# Patient Record
Sex: Male | Born: 1935 | Race: White | Hispanic: No | Marital: Married | Smoking: Former smoker
Health system: Southern US, Community
[De-identification: ages and names within clinical notes are randomized; demographics above are authoritative.]

## PROBLEM LIST (undated history)

## (undated) DIAGNOSIS — K449 Diaphragmatic hernia without obstruction or gangrene: Secondary | ICD-10-CM

## (undated) DIAGNOSIS — I2699 Other pulmonary embolism without acute cor pulmonale: Secondary | ICD-10-CM

## (undated) DIAGNOSIS — R053 Chronic cough: Secondary | ICD-10-CM

## (undated) DIAGNOSIS — K922 Gastrointestinal hemorrhage, unspecified: Secondary | ICD-10-CM

## (undated) DIAGNOSIS — E119 Type 2 diabetes mellitus without complications: Secondary | ICD-10-CM

## (undated) DIAGNOSIS — C61 Malignant neoplasm of prostate: Secondary | ICD-10-CM

## (undated) DIAGNOSIS — I82409 Acute embolism and thrombosis of unspecified deep veins of unspecified lower extremity: Secondary | ICD-10-CM

## (undated) DIAGNOSIS — I10 Essential (primary) hypertension: Secondary | ICD-10-CM

## (undated) DIAGNOSIS — Z9289 Personal history of other medical treatment: Secondary | ICD-10-CM

## (undated) DIAGNOSIS — R05 Cough: Secondary | ICD-10-CM

## (undated) HISTORY — DX: Other pulmonary embolism without acute cor pulmonale: I26.99

## (undated) HISTORY — PX: IVC FILTER INSERTION: CATH118245

## (undated) HISTORY — DX: Essential (primary) hypertension: I10

## (undated) HISTORY — DX: Chronic cough: R05.3

## (undated) HISTORY — DX: Diaphragmatic hernia without obstruction or gangrene: K44.9

## (undated) HISTORY — DX: Type 2 diabetes mellitus without complications: E11.9

## (undated) HISTORY — PX: OTHER SURGICAL HISTORY: SHX169

## (undated) HISTORY — DX: Cough: R05

## (undated) HISTORY — DX: Acute embolism and thrombosis of unspecified deep veins of unspecified lower extremity: I82.409

## (undated) HISTORY — DX: Personal history of other medical treatment: Z92.89

## (undated) HISTORY — PX: HERNIA REPAIR: SHX51

## (undated) HISTORY — PX: HIATAL HERNIA REPAIR: SHX195

## (undated) HISTORY — DX: Gastrointestinal hemorrhage, unspecified: K92.2

## (undated) HISTORY — DX: Malignant neoplasm of prostate: C61

## (undated) HISTORY — PX: PROSTATE SURGERY: SHX751

---

## 1998-03-09 ENCOUNTER — Other Ambulatory Visit: Admission: RE | Admit: 1998-03-09 | Discharge: 1998-03-09 | Payer: Self-pay | Admitting: Urology

## 1998-08-01 ENCOUNTER — Other Ambulatory Visit: Admission: RE | Admit: 1998-08-01 | Discharge: 1998-08-01 | Payer: Self-pay | Admitting: Urology

## 2004-10-07 DIAGNOSIS — Z9289 Personal history of other medical treatment: Secondary | ICD-10-CM

## 2004-10-07 HISTORY — DX: Personal history of other medical treatment: Z92.89

## 2007-10-30 ENCOUNTER — Encounter (HOSPITAL_COMMUNITY): Admission: RE | Admit: 2007-10-30 | Discharge: 2008-01-28 | Payer: Self-pay | Admitting: Urology

## 2008-09-13 ENCOUNTER — Encounter: Admission: RE | Admit: 2008-09-13 | Discharge: 2008-09-13 | Payer: Self-pay | Admitting: Internal Medicine

## 2010-10-03 ENCOUNTER — Ambulatory Visit (HOSPITAL_COMMUNITY)
Admission: RE | Admit: 2010-10-03 | Discharge: 2010-10-03 | Payer: Self-pay | Source: Home / Self Care | Attending: Orthopedic Surgery | Admitting: Orthopedic Surgery

## 2010-10-07 DIAGNOSIS — Z9289 Personal history of other medical treatment: Secondary | ICD-10-CM

## 2010-10-07 HISTORY — DX: Personal history of other medical treatment: Z92.89

## 2011-03-13 ENCOUNTER — Inpatient Hospital Stay (HOSPITAL_COMMUNITY)
Admission: AD | Admit: 2011-03-13 | Discharge: 2011-03-17 | DRG: 176 | Disposition: A | Discharge status: Home / Self Care | Payer: Medicare Other | Source: Other Acute Inpatient Hospital | Attending: Internal Medicine | Admitting: Internal Medicine

## 2011-03-13 DIAGNOSIS — Z8546 Personal history of malignant neoplasm of prostate: Secondary | ICD-10-CM

## 2011-03-13 DIAGNOSIS — Z79899 Other long term (current) drug therapy: Secondary | ICD-10-CM

## 2011-03-13 DIAGNOSIS — I10 Essential (primary) hypertension: Secondary | ICD-10-CM | POA: Diagnosis present

## 2011-03-13 DIAGNOSIS — D649 Anemia, unspecified: Secondary | ICD-10-CM | POA: Diagnosis not present

## 2011-03-13 DIAGNOSIS — I824Y9 Acute embolism and thrombosis of unspecified deep veins of unspecified proximal lower extremity: Secondary | ICD-10-CM | POA: Diagnosis present

## 2011-03-13 DIAGNOSIS — I2692 Saddle embolus of pulmonary artery without acute cor pulmonale: Principal | ICD-10-CM | POA: Diagnosis present

## 2011-03-13 DIAGNOSIS — Z87891 Personal history of nicotine dependence: Secondary | ICD-10-CM

## 2011-03-14 DIAGNOSIS — I2699 Other pulmonary embolism without acute cor pulmonale: Secondary | ICD-10-CM

## 2011-03-14 DIAGNOSIS — R222 Localized swelling, mass and lump, trunk: Secondary | ICD-10-CM

## 2011-03-14 DIAGNOSIS — Z9289 Personal history of other medical treatment: Secondary | ICD-10-CM

## 2011-03-14 DIAGNOSIS — I517 Cardiomegaly: Secondary | ICD-10-CM

## 2011-03-14 HISTORY — DX: Personal history of other medical treatment: Z92.89

## 2011-03-14 LAB — CBC
HCT: 38.2 % — ABNORMAL LOW (ref 39.0–52.0)
HCT: 39.2 % (ref 39.0–52.0)
Hemoglobin: 13.4 g/dL (ref 13.0–17.0)
Hemoglobin: 13.4 g/dL (ref 13.0–17.0)
MCH: 30.8 pg (ref 26.0–34.0)
MCH: 30.3 pg (ref 26.0–34.0)
MCHC: 35.1 g/dL (ref 30.0–36.0)
MCHC: 34.2 g/dL (ref 30.0–36.0)
MCV: 87.8 fL (ref 78.0–100.0)
MCV: 88.7 fL (ref 78.0–100.0)
Platelets: 126 10*3/uL — ABNORMAL LOW (ref 150–400)
Platelets: 140 10*3/uL — ABNORMAL LOW (ref 150–400)
RBC: 4.35 MIL/uL (ref 4.22–5.81)
RBC: 4.42 MIL/uL (ref 4.22–5.81)
RDW: 12.7 % (ref 11.5–15.5)
RDW: 12.9 % (ref 11.5–15.5)
WBC: 7.2 10*3/uL (ref 4.0–10.5)
WBC: 7.7 10*3/uL (ref 4.0–10.5)

## 2011-03-14 LAB — DIFFERENTIAL
Basophils Absolute: 0 10*3/uL (ref 0.0–0.1)
Basophils Absolute: 0 10*3/uL (ref 0.0–0.1)
Basophils Relative: 0 % (ref 0–1)
Basophils Relative: 1 % (ref 0–1)
Eosinophils Absolute: 0.2 10*3/uL (ref 0.0–0.7)
Eosinophils Absolute: 0.2 10*3/uL (ref 0.0–0.7)
Eosinophils Relative: 3 % (ref 0–5)
Eosinophils Relative: 3 % (ref 0–5)
Lymphocytes Relative: 24 % (ref 12–46)
Lymphocytes Relative: 30 % (ref 12–46)
Lymphs Abs: 1.7 10*3/uL (ref 0.7–4.0)
Lymphs Abs: 2.3 10*3/uL (ref 0.7–4.0)
Monocytes Absolute: 0.7 10*3/uL (ref 0.1–1.0)
Monocytes Absolute: 0.8 10*3/uL (ref 0.1–1.0)
Monocytes Relative: 9 % (ref 3–12)
Monocytes Relative: 10 % (ref 3–12)
Neutro Abs: 4.5 10*3/uL (ref 1.7–7.7)
Neutro Abs: 4.3 10*3/uL (ref 1.7–7.7)
Neutrophils Relative %: 63 % (ref 43–77)
Neutrophils Relative %: 57 % (ref 43–77)

## 2011-03-14 LAB — COMPREHENSIVE METABOLIC PANEL
ALT: 36 U/L (ref 0–53)
ALT: 33 U/L (ref 0–53)
AST: 27 U/L (ref 0–37)
AST: 24 U/L (ref 0–37)
Albumin: 3.4 g/dL — ABNORMAL LOW (ref 3.5–5.2)
Albumin: 3.4 g/dL — ABNORMAL LOW (ref 3.5–5.2)
Alkaline Phosphatase: 72 U/L (ref 39–117)
Alkaline Phosphatase: 70 U/L (ref 39–117)
BUN: 26 mg/dL — ABNORMAL HIGH (ref 6–23)
BUN: 27 mg/dL — ABNORMAL HIGH (ref 6–23)
CO2: 28 mEq/L (ref 19–32)
CO2: 29 mEq/L (ref 19–32)
Calcium: 9.4 mg/dL (ref 8.4–10.5)
Calcium: 9.4 mg/dL (ref 8.4–10.5)
Chloride: 98 mEq/L (ref 96–112)
Chloride: 100 mEq/L (ref 96–112)
Creatinine, Ser: 1.08 mg/dL (ref 0.4–1.5)
Creatinine, Ser: 1.11 mg/dL (ref 0.4–1.5)
GFR calc Af Amer: >60 mL/min (ref >60)
GFR calc Af Amer: >60 mL/min (ref >60)
GFR calc non Af Amer: >60 mL/min (ref >60)
GFR calc non Af Amer: >60 mL/min (ref >60)
Glucose, Bld: 172 mg/dL — ABNORMAL HIGH (ref 70–99)
Glucose, Bld: 96 mg/dL (ref 70–99)
Potassium: 3.6 mEq/L (ref 3.5–5.1)
Potassium: 4.2 mEq/L (ref 3.5–5.1)
Sodium: 137 mEq/L (ref 135–145)
Sodium: 138 mEq/L (ref 135–145)
Total Bilirubin: 0.4 mg/dL (ref 0.3–1.2)
Total Bilirubin: 0.5 mg/dL (ref 0.3–1.2)
Total Protein: 6.8 g/dL (ref 6.0–8.3)
Total Protein: 6.8 g/dL (ref 6.0–8.3)

## 2011-03-14 LAB — HEPARIN LEVEL (UNFRACTIONATED)
Heparin Unfractionated: 0.45 IU/mL (ref 0.30–0.70)
Heparin Unfractionated: 0.68 IU/mL (ref 0.30–0.70)

## 2011-03-14 LAB — CARDIAC PANEL(CRET KIN+CKTOT+MB+TROPI)
CK, MB: 4.8 ng/mL — ABNORMAL HIGH (ref 0.3–4.0)
CK, MB: 4.2 ng/mL — ABNORMAL HIGH (ref 0.3–4.0)
CK, MB: 3.4 ng/mL (ref 0.3–4.0)
Relative Index: INVALID (ref 0.0–2.5)
Relative Index: INVALID (ref 0.0–2.5)
Relative Index: INVALID (ref 0.0–2.5)
Total CK: 76 U/L (ref 7–232)
Total CK: 61 U/L (ref 7–232)
Total CK: 47 U/L (ref 7–232)
Troponin I: <0.3 ng/mL (ref <0.30)
Troponin I: <0.3 ng/mL (ref <0.30)
Troponin I: <0.3 ng/mL (ref <0.30)

## 2011-03-14 LAB — HEMOGLOBIN AND HEMATOCRIT, BLOOD
HCT: 38.7 % — ABNORMAL LOW (ref 39.0–52.0)
HCT: 39 % (ref 39.0–52.0)
Hemoglobin: 13.1 g/dL (ref 13.0–17.0)
Hemoglobin: 13.3 g/dL (ref 13.0–17.0)

## 2011-03-14 LAB — PHOSPHORUS
Phosphorus: 2.6 mg/dL (ref 2.3–4.6)
Phosphorus: 3.9 mg/dL (ref 2.3–4.6)

## 2011-03-14 LAB — HEMOGLOBIN A1C
Hgb A1c MFr Bld: 6 % — ABNORMAL HIGH (ref <5.7)
Mean Plasma Glucose: 126 mg/dL — ABNORMAL HIGH (ref <117)

## 2011-03-14 LAB — MAGNESIUM
Magnesium: 1.9 mg/dL (ref 1.5–2.5)
Magnesium: 2 mg/dL (ref 1.5–2.5)

## 2011-03-14 LAB — PROTIME-INR
INR: 1.05 (ref 0.00–1.49)
Prothrombin Time: 13.9 seconds (ref 11.6–15.2)

## 2011-03-14 LAB — APTT: aPTT: 77 seconds — ABNORMAL HIGH (ref 24–37)

## 2011-03-15 LAB — BASIC METABOLIC PANEL
BUN: 23 mg/dL (ref 6–23)
CO2: 30 mEq/L (ref 19–32)
Calcium: 9.7 mg/dL (ref 8.4–10.5)
Chloride: 100 mEq/L (ref 96–112)
Creatinine, Ser: 1 mg/dL (ref 0.4–1.5)
GFR calc Af Amer: >60 mL/min (ref >60)
GFR calc non Af Amer: >60 mL/min (ref >60)
Glucose, Bld: 101 mg/dL — ABNORMAL HIGH (ref 70–99)
Potassium: 4.8 mEq/L (ref 3.5–5.1)
Sodium: 137 mEq/L (ref 135–145)

## 2011-03-15 LAB — PROTIME-INR
INR: 1.15 (ref 0.00–1.49)
Prothrombin Time: 14.9 seconds (ref 11.6–15.2)

## 2011-03-15 LAB — CBC
HCT: 40.4 % (ref 39.0–52.0)
Hemoglobin: 13.6 g/dL (ref 13.0–17.0)
MCH: 30.5 pg (ref 26.0–34.0)
MCHC: 33.7 g/dL (ref 30.0–36.0)
MCV: 90.6 fL (ref 78.0–100.0)
Platelets: 153 10*3/uL (ref 150–400)
RBC: 4.46 MIL/uL (ref 4.22–5.81)
RDW: 12.8 % (ref 11.5–15.5)
WBC: 7.6 10*3/uL (ref 4.0–10.5)

## 2011-03-15 LAB — HEPARIN LEVEL (UNFRACTIONATED): Heparin Unfractionated: 0.69 IU/mL (ref 0.30–0.70)

## 2011-03-15 LAB — HEMOGLOBIN AND HEMATOCRIT, BLOOD
HCT: 38 % — ABNORMAL LOW (ref 39.0–52.0)
Hemoglobin: 12.9 g/dL — ABNORMAL LOW (ref 13.0–17.0)

## 2011-03-16 LAB — CBC
HCT: 37.4 % — ABNORMAL LOW (ref 39.0–52.0)
Hemoglobin: 12.9 g/dL — ABNORMAL LOW (ref 13.0–17.0)
MCH: 30.6 pg (ref 26.0–34.0)
MCHC: 34.5 g/dL (ref 30.0–36.0)
MCV: 88.8 fL (ref 78.0–100.0)
Platelets: 143 10*3/uL — ABNORMAL LOW (ref 150–400)
RBC: 4.21 MIL/uL — ABNORMAL LOW (ref 4.22–5.81)
RDW: 12.9 % (ref 11.5–15.5)
WBC: 8.1 10*3/uL (ref 4.0–10.5)

## 2011-03-16 LAB — PROTIME-INR
INR: 1.41 (ref 0.00–1.49)
Prothrombin Time: 17.5 seconds — ABNORMAL HIGH (ref 11.6–15.2)

## 2011-03-16 LAB — HEPARIN LEVEL (UNFRACTIONATED): Heparin Unfractionated: 0.54 IU/mL (ref 0.30–0.70)

## 2011-03-17 LAB — PROTIME-INR
INR: 2.04 — ABNORMAL HIGH (ref 0.00–1.49)
Prothrombin Time: 23.2 seconds — ABNORMAL HIGH (ref 11.6–15.2)

## 2011-03-21 NOTE — Discharge Summary (Signed)
Dakota Lewis, Dakota Lewis                 ACCOUNT NO.:  [id number]  MEDICAL RECORD NO.:  [id number]  LOCATION:  2022                         FACILITY:  [place]  PHYSICIAN:  Oren Binet, MD    DATE OF BIRTH:  10-15-1932  DATE OF ADMISSION:  03/13/2011 DATE OF DISCHARGE:  03/17/2011                        DISCHARGE SUMMARY - REFERRING   PRIMARY CARE PRACTITIONER:  Nelda Bucks, MD  PRIMARY DISCHARGE DIAGNOSES: 1. Significant bilateral pulmonary embolism to main pulmonary arteries     with a strand of clot connecting it. 2. Deep vein thrombosis of the left lower extremity.  SECONDARY DISCHARGE DIAGNOSES: 1. History of hypertension. 2. Prior history of prior gastrointestinal bleed. 3. History of prostate cancer, status post radiation. 4. History of chronic cough.  DISCHARGE MEDICATIONS: 1. Coumadin 5 mg 1 tablet daily. 2. Lovenox 120 mg subcutaneously daily. 3. Ferrous sulfate 325 mg 1 tablet p.o. twice daily. 4. Fish oil 1000 mg 2 capsule p.o. twice daily. 5. Hydrochlorothiazide 25 mg 1 tablet every morning. 6. Lisinopril 20 mg 1 tablet twice daily. 7. Omeprazole 20 mg 1 tablet p.o. daily at bedtime. 8. Vitamin D3 1000 units 2 tablets p.o. every morning.  CONSULTATION:  Dr. Lamonte Sakai from a pulmonary critical care.  BRIEF HISTORY OF PRESENT ILLNESS:  The patient is a very pleasant 77- year-old male who apparently has been having on and off issues with cough for the past 6 months and was having some shortness of breath as well.  He went to see his primary care practitioner where a CT angiogram of the chest was done which did demonstrate significant pulmonary embolism with saddle component.  The CTA of the chest was done at [facility].  Because of the significant nature of his pulmonary embolism he was transferred to [facility] for further evaluation and treatment.  For further details please see the history and physical that was dictated by Dr. Benny Lennert on  admission.  PERTINENT RADIOLOGICAL STUDIES:  A CT angiogram of the chest done at [facility] was read as bilateral main pulmonary artery significant thrombus, a linear thrombus is noted connecting thrombi in the right and left main pulmonary artery.  Findings are consistent with significant bilateral pulmonary emboli.  No acute infiltrate or edema. No lung infarction.  PERTINENT LABORATORY DATA:  INR on discharge is 2.04.  A 2-D echocardiogram showed systolic function to be vigorous with an EF around 65%-70%.  The right ventricle was mildly dilated.  There was grade 1 diastolic dysfunction.  BRIEF HOSPITAL COURSE:  1.  Large pulmonary embolism with a small saddle component.  The patient was admitted to [facility] as a transfer from [place] [facility] because of the significant clot burden.  It is unknown what actually provoked this patient's pulmonary embolism and deep vein thrombosis.  The patient does have a history of prostate cancer, but the family and the patient tell me that only a few months ago his PSA was actually within normal limits.  He has no significant history of travel in the recent past.  He claims his other cancer screenings are actually up to date.  In any event, the patient was admitted, placed on heparin and started on  Coumadin.  He did have significant shortness of breath upon just walking even 25 yards. Currently, he is now been transitioned to Lovenox and Coumadin and his INR is therapeutic on the fourth day.  His shortness of breath has almost resolved and he is actually ambulatory in the hallway without any difficulty.  He is also hemodynamically stable with O2 saturations maintaining on room air.  It is our plan at this time to continue his Lovenox until tomorrow to complete a 5-day overlap.  He has been getting 7.5 mg of Coumadin for the past 3 days and his INR is therapeutic, so we will slightly cut down the dose to 5 mg for the next 2 days.   Patient has an appointment with his primary care practitioner scheduled by our care management worker for Tuesday the March 19, 2011 at 1:45 p.m. for an INR check.  The patient will be provided with a prescription for Lovenox for another 2 more days and a week supply of Coumadin.  Lovenox teaching has been completed by the nursing staff and the patient is actually injected Lovenox himself and feels pretty comfortable doing at home.  At this point it is believed that the patient has met maximal benefit from his inpatient stay and is stable to be discharged home to continue anticoagulation as an outpatient.  Please do note that a venous Doppler ultrasound did show a DVT in the left lower extremity as well. 2. Hypertension.  This was stable.  The patient is to continue his     regular medications. 3. The patient has a slight ecchymotic area on his right forearm that     has been stable for now 48 hours.  The patient has been instructed     by me to present to the ED or seek immediate medical attention with     his primary care practitioner if this would enlarge rapidly.  At     this time it is believed that this was probably a small traumatic scratch that resulted in this and it is felt that Coumadin skin     necrosis is very unlikely.  However, the patient has been asked to     keep an eye and this and present to the ED or seek immediate     medical attention if it would enlarge. 4. Prior history of GI bleed.  He is maintained on a proton pump     inhibitor.  The patient has been instructed by me repeatedly to     seek immediate medical attention if there is any evidence of     hematemesis or blood in his stools.  The patient and the family     claim understanding. 5. History of anemia.  The patient is maintained on iron     supplementation.  He is to continue this. 6. History of prostate cancer.  The patient is to follow up with     [facility] in the next few weeks as  well.  DISPOSITION:  It is felt that this patient has met maximal benefit from his inpatient hospital stay and is stable to be discharged home.  FOLLOWUP INSTRUCTIONS: 1. The patient will follow up with his primary care practitioner and     primary urologist for further general cancer screening and to     determine what actually provoked his large clot burden. 2. The patient is to follow up with his primary care practitioner on     March 19, 2011 at  1:45 p.m. for an INR check. 3. The patient is to follow up with [facility] in the next few     weeks as well.  He is to call and make an appointment.  Total time spent coordinating discharge 55 minutes.     Oren Binet, MD     SG/MEDQ  D:  03/17/2011  T:  03/17/2011  Job:  [id number]  cc:   Nelda Bucks, MD [facility]  Electronically Signed by Oren Binet  on 03/21/2011 07:35:29 PM

## 2011-03-29 ENCOUNTER — Institutional Professional Consult (permissible substitution): Payer: Self-pay | Admitting: Emergency Medicine

## 2011-04-22 NOTE — H&P (Signed)
Dakota Lewis, Dakota Lewis                 ACCOUNT NO.:  [id number]  MEDICAL RECORD NO.:  [id number]  LOCATION:  2022                         FACILITY:  [facility]  PHYSICIAN:  Draven Laine, DO         DATE OF BIRTH:  May 20, 1933  DATE OF ADMISSION:  03/13/2011 DATE OF DISCHARGE:                             HISTORY & PHYSICAL   CHIEF COMPLAINT:  Shortness of breath, chest pain.  HISTORY OF PRESENT ILLNESS:  The patient is a 77 year old male who has been having a bad cough for about 6 months.  In the last 2 weeks, he has become particularly short of breath, at times having chest pain with his shortness of breath.  His wife states that about 10 days ago she noticed a swelling over the right side of his chest.  They went to see Dr. Delena Bali today and he sent them directly over to [facility] to have a CTA of the chest.  This demonstrated a significant bilateral pulmonary embolus with a strand of clot connecting the emboli in the main pulmonary arteries.  The patient was transferred to [facility] as per family request.  The patient has a previous appointment with Dr. Lamonte Sakai to evaluate this subjective swelling of the right side of the patient's chest.  Curiously, there is no mention of any soft tissue or pulmonary masses or adenopathy on the CT report.  PAST MEDICAL HISTORY: 1. Hypertension. 2. GI bleed. 3. Prostate cancer status post radiation.  PAST SURGICAL HISTORY:  None.  MEDICATIONS AT HOME: 1. Lisinopril 20 mg 1 p.o. b.i.d. 2. Hydrochlorothiazide 25 mg 1 p.o. q.a.m. 3. Omeprazole 20 mg 1 p.o. each night. 4. Vitamin D3 2000 International Units each morning. 5. Fish oil 1000 mg 2 tablets q.a.m. and 2 q.p.m. 6. Ferrous sulfate 325 mg 1 p.o. every morning.  ALLERGIES:  NKDA.  SOCIAL HISTORY:  The patient quit smoking in 1969.  He quit drinking in 1969 and he has no recreational drug use.  FAMILY HISTORY:  Positive for coronary artery disease in his brothers and cancer in  his brothers.  The patient sees a cardiologist at [facility] and he had an appointment with Dr. Lamonte Sakai with [facility]ology.  REVIEW OF SYSTEMS:  CONSTITUTIONAL:  Negative for fever, negative for chills, positive for weakness, and positive for fatigue.  CNS:  Positive for headache, negative for seizure, negative for specific limb weakness. ENT:  No nasal congestion, throat pain, or coryza.  CARDIOVASCULAR: Positive for chest pain with shortness of breath.  Negative for palpitations and negative for orthopnea.  RESPIRATORY:  Positive for shortness breath, positive for cough, positive for sputum production which the patient states was yellow last week but is white frothy this week.  GASTROINTESTINAL:  He has had some vomiting in the last week.  He states that a week or so ago he had some diarrhea.  He occasionally does see fresh blood in his stool but not recently and no hematochezia and no coffee-ground emesis.  GENITOURINARY:  No dysuria, no hematuria, and no urinary frequency.  RENAL:  No flank pain, no swelling, and no pruritus. SKIN:  No rashes, sores, or lesions.  HEMATOLOGIC:  Negative bruising, no purpura, and no clots.  LYMPH:  No lymphadenopathy, no painful nodes, and no specific lymph swelling.  PSYCHIATRIC:  No anxiety, no depression, and no insomnia.  PHYSICAL EXAMINATION:  VITAL SIGNS:  Blood pressure 156/82, pulse 65, respiratory rate 16, temperature 98, and O2 sat 92% on room air. GENERAL:  The patient is an elderly, well-nourished, well-developed male who is awake, alert, and ordered x3. EYES:  Pupils equal, round, and reactive to light and accommodation. External ocular movements are intact.  Sclerae nonicteric, noninjected. MOUTH:  Oral mucosa moist.  No lesions.  No sores.  Pharynx clear.  No erythema and no exudate. NECK:  Negative for JVD, negative for thyromegaly, and negative for lymphadenopathy. HEART:  Regular rate and rhythm at 80 beats per minute without  murmur, ectopy, or gallops.  No lateral PMI.  No thrills. LUNGS:  Clear to auscultation bilaterally without wheezes, rales, or rhonchi.  No increase work of breathing.  No tactile fremitus. ABDOMEN:  Soft, nontender, and nondistended.  Positive bowel sounds.  No hepatosplenomegaly.  No hernias palpated. CARDIOVASCULAR/EXTREMITIES:  Negative for cyanosis, clubbing, or edema. The patient has somewhat diminished dorsalis pedis and popliteal pulses bilaterally.  No carotid bruits bilaterally. NEUROLOGIC:  Cranial nerves II-XII grossly intact.  Motor and sensory intact.  LABORATORY DATA:  All pending with the exception of a CT angiogram report from [facility] that shows significant pulmonary embolus in the bilateral main pulmonary arteries.  No acute infiltrate or edema. No lung infarction.  No adenopathy.  ASSESSMENT: 1. Significant bilateral pulmonary emboli to main pulmonary arteries     with a strand of clots connecting to it. 2. Gastrointestinal bleed.  The patient with occult blood in his stool     as per guaiacs, however, it is notable that the patient takes iron     and this may have skewed the results of this test. 3. Dyspnea secondary to bilateral pulmonary emboli. 4. Hypertension. 5. Headaches. 6. Concern for right chest mass, not apparent or mentioned in CT     report.  PLAN: 1. The patient is admitted to telemetry.  He will receive IV heparin     per protocol.  He received a bolus at [place], so he will not get     that again. 2. Q.6 hours H and H as the patient is on heparin.  If his H and H     does happen to drop, we can certainly stop the heparin.  The     patient will be placed on IV Protonix.  We will transfuse him as     necessary.  We will consult [facility] in the morning.  We     will continue the patient on his home medications.          ______________________________ Karie Kirks, DO     AS/MEDQ  D:  03/13/2011  T:  03/14/2011  Job:   [id number]  cc:   Nelda Bucks, MD Collene Gobble, MD  Electronically Signed by Karie Kirks DO on 04/22/2011 01:37:38 PM

## 2011-05-07 DIAGNOSIS — Z9289 Personal history of other medical treatment: Secondary | ICD-10-CM

## 2011-05-07 HISTORY — DX: Personal history of other medical treatment: Z92.89

## 2011-08-15 ENCOUNTER — Other Ambulatory Visit (HOSPITAL_COMMUNITY): Payer: Self-pay | Admitting: Cardiovascular Disease

## 2011-08-15 DIAGNOSIS — I2699 Other pulmonary embolism without acute cor pulmonale: Secondary | ICD-10-CM

## 2011-09-17 ENCOUNTER — Ambulatory Visit (HOSPITAL_COMMUNITY)
Admission: RE | Admit: 2011-09-17 | Discharge: 2011-09-17 | Disposition: A | Discharge status: Home / Self Care | Payer: Medicare Other | Source: Ambulatory Visit | Attending: Cardiovascular Disease | Admitting: Cardiovascular Disease

## 2011-09-17 DIAGNOSIS — I2699 Other pulmonary embolism without acute cor pulmonale: Secondary | ICD-10-CM

## 2011-09-17 DIAGNOSIS — R079 Chest pain, unspecified: Secondary | ICD-10-CM | POA: Insufficient documentation

## 2011-09-17 DIAGNOSIS — R0602 Shortness of breath: Secondary | ICD-10-CM | POA: Insufficient documentation

## 2011-09-17 MED ORDER — IOHEXOL 300 MG/ML  SOLN
100.0000 mL | Freq: Once | INTRAMUSCULAR | Status: AC | PRN
  Administered 2011-09-17: 100 mL via INTRAVENOUS

## 2011-12-31 LAB — HM COLONOSCOPY

## 2012-05-11 ENCOUNTER — Other Ambulatory Visit (HOSPITAL_COMMUNITY): Payer: Self-pay | Admitting: Cardiovascular Disease

## 2012-05-11 DIAGNOSIS — I2699 Other pulmonary embolism without acute cor pulmonale: Secondary | ICD-10-CM

## 2012-05-12 ENCOUNTER — Ambulatory Visit (HOSPITAL_COMMUNITY)
Admission: RE | Admit: 2012-05-12 | Discharge: 2012-05-12 | Disposition: A | Discharge status: Home / Self Care | Payer: Medicare Other | Source: Ambulatory Visit | Attending: Cardiovascular Disease | Admitting: Cardiovascular Disease

## 2012-05-12 DIAGNOSIS — I2699 Other pulmonary embolism without acute cor pulmonale: Secondary | ICD-10-CM | POA: Insufficient documentation

## 2012-05-12 DIAGNOSIS — R05 Cough: Secondary | ICD-10-CM | POA: Insufficient documentation

## 2012-05-12 DIAGNOSIS — R059 Cough, unspecified: Secondary | ICD-10-CM | POA: Insufficient documentation

## 2012-05-12 DIAGNOSIS — I517 Cardiomegaly: Secondary | ICD-10-CM | POA: Insufficient documentation

## 2012-05-12 DIAGNOSIS — I2510 Atherosclerotic heart disease of native coronary artery without angina pectoris: Secondary | ICD-10-CM | POA: Insufficient documentation

## 2012-05-12 DIAGNOSIS — I2584 Coronary atherosclerosis due to calcified coronary lesion: Secondary | ICD-10-CM | POA: Insufficient documentation

## 2012-05-12 MED ORDER — IOHEXOL 350 MG/ML SOLN
80.0000 mL | Freq: Once | INTRAVENOUS | Status: AC | PRN
  Administered 2012-05-12: 80 mL via INTRAVENOUS

## 2012-07-22 ENCOUNTER — Encounter: Payer: Self-pay | Admitting: Gastroenterology

## 2012-08-04 ENCOUNTER — Ambulatory Visit: Payer: Medicare Other | Admitting: Gastroenterology

## 2015-05-16 ENCOUNTER — Encounter: Payer: Self-pay | Admitting: *Deleted

## 2015-07-05 ENCOUNTER — Encounter: Payer: Self-pay | Admitting: Cardiovascular Disease

## 2015-11-07 DIAGNOSIS — Z7901 Long term (current) use of anticoagulants: Secondary | ICD-10-CM | POA: Diagnosis not present

## 2015-12-06 DIAGNOSIS — R791 Abnormal coagulation profile: Secondary | ICD-10-CM | POA: Diagnosis not present

## 2015-12-11 DIAGNOSIS — I2699 Other pulmonary embolism without acute cor pulmonale: Secondary | ICD-10-CM | POA: Diagnosis not present

## 2015-12-11 DIAGNOSIS — I10 Essential (primary) hypertension: Secondary | ICD-10-CM | POA: Diagnosis not present

## 2015-12-11 DIAGNOSIS — E559 Vitamin D deficiency, unspecified: Secondary | ICD-10-CM | POA: Diagnosis not present

## 2015-12-11 DIAGNOSIS — Z79899 Other long term (current) drug therapy: Secondary | ICD-10-CM | POA: Diagnosis not present

## 2015-12-11 DIAGNOSIS — E119 Type 2 diabetes mellitus without complications: Secondary | ICD-10-CM | POA: Diagnosis not present

## 2015-12-11 DIAGNOSIS — Z6829 Body mass index (BMI) 29.0-29.9, adult: Secondary | ICD-10-CM | POA: Diagnosis not present

## 2015-12-11 DIAGNOSIS — I80202 Phlebitis and thrombophlebitis of unspecified deep vessels of left lower extremity: Secondary | ICD-10-CM | POA: Diagnosis not present

## 2015-12-11 DIAGNOSIS — E663 Overweight: Secondary | ICD-10-CM | POA: Diagnosis not present

## 2015-12-11 DIAGNOSIS — E785 Hyperlipidemia, unspecified: Secondary | ICD-10-CM | POA: Diagnosis not present

## 2016-01-08 DIAGNOSIS — Z7901 Long term (current) use of anticoagulants: Secondary | ICD-10-CM | POA: Diagnosis not present

## 2016-02-07 DIAGNOSIS — Z7901 Long term (current) use of anticoagulants: Secondary | ICD-10-CM | POA: Diagnosis not present

## 2016-02-12 DIAGNOSIS — J208 Acute bronchitis due to other specified organisms: Secondary | ICD-10-CM | POA: Diagnosis not present

## 2016-02-12 DIAGNOSIS — Z6831 Body mass index (BMI) 31.0-31.9, adult: Secondary | ICD-10-CM | POA: Diagnosis not present

## 2016-02-12 DIAGNOSIS — R635 Abnormal weight gain: Secondary | ICD-10-CM | POA: Diagnosis not present

## 2016-02-16 DIAGNOSIS — K7469 Other cirrhosis of liver: Secondary | ICD-10-CM | POA: Diagnosis not present

## 2016-02-16 DIAGNOSIS — K7460 Unspecified cirrhosis of liver: Secondary | ICD-10-CM | POA: Diagnosis not present

## 2016-02-16 DIAGNOSIS — N281 Cyst of kidney, acquired: Secondary | ICD-10-CM | POA: Diagnosis not present

## 2016-02-21 DIAGNOSIS — Z0000 Encounter for general adult medical examination without abnormal findings: Secondary | ICD-10-CM | POA: Diagnosis not present

## 2016-02-21 DIAGNOSIS — C61 Malignant neoplasm of prostate: Secondary | ICD-10-CM | POA: Diagnosis not present

## 2016-02-21 DIAGNOSIS — E291 Testicular hypofunction: Secondary | ICD-10-CM | POA: Diagnosis not present

## 2016-02-21 DIAGNOSIS — N3281 Overactive bladder: Secondary | ICD-10-CM | POA: Diagnosis not present

## 2016-02-21 DIAGNOSIS — N5201 Erectile dysfunction due to arterial insufficiency: Secondary | ICD-10-CM | POA: Diagnosis not present

## 2016-02-21 DIAGNOSIS — N400 Enlarged prostate without lower urinary tract symptoms: Secondary | ICD-10-CM | POA: Diagnosis not present

## 2016-03-11 DIAGNOSIS — Z7901 Long term (current) use of anticoagulants: Secondary | ICD-10-CM | POA: Diagnosis not present

## 2016-03-18 DIAGNOSIS — I80202 Phlebitis and thrombophlebitis of unspecified deep vessels of left lower extremity: Secondary | ICD-10-CM | POA: Diagnosis not present

## 2016-03-18 DIAGNOSIS — I2699 Other pulmonary embolism without acute cor pulmonale: Secondary | ICD-10-CM | POA: Diagnosis not present

## 2016-03-18 DIAGNOSIS — E559 Vitamin D deficiency, unspecified: Secondary | ICD-10-CM | POA: Diagnosis not present

## 2016-03-18 DIAGNOSIS — I10 Essential (primary) hypertension: Secondary | ICD-10-CM | POA: Diagnosis not present

## 2016-03-18 DIAGNOSIS — Z79899 Other long term (current) drug therapy: Secondary | ICD-10-CM | POA: Diagnosis not present

## 2016-03-18 DIAGNOSIS — E785 Hyperlipidemia, unspecified: Secondary | ICD-10-CM | POA: Diagnosis not present

## 2016-03-18 DIAGNOSIS — Z6830 Body mass index (BMI) 30.0-30.9, adult: Secondary | ICD-10-CM | POA: Diagnosis not present

## 2016-03-18 DIAGNOSIS — E669 Obesity, unspecified: Secondary | ICD-10-CM | POA: Diagnosis not present

## 2016-03-18 DIAGNOSIS — E119 Type 2 diabetes mellitus without complications: Secondary | ICD-10-CM | POA: Diagnosis not present

## 2016-04-01 DIAGNOSIS — Z6830 Body mass index (BMI) 30.0-30.9, adult: Secondary | ICD-10-CM | POA: Diagnosis not present

## 2016-04-01 DIAGNOSIS — J208 Acute bronchitis due to other specified organisms: Secondary | ICD-10-CM | POA: Diagnosis not present

## 2016-04-06 LAB — HM DIABETES EYE EXAM

## 2016-04-11 DIAGNOSIS — Z7901 Long term (current) use of anticoagulants: Secondary | ICD-10-CM | POA: Diagnosis not present

## 2016-05-14 DIAGNOSIS — Z7901 Long term (current) use of anticoagulants: Secondary | ICD-10-CM | POA: Diagnosis not present

## 2016-05-28 DIAGNOSIS — R791 Abnormal coagulation profile: Secondary | ICD-10-CM | POA: Diagnosis not present

## 2016-05-30 DIAGNOSIS — R791 Abnormal coagulation profile: Secondary | ICD-10-CM | POA: Diagnosis not present

## 2016-06-06 DIAGNOSIS — R791 Abnormal coagulation profile: Secondary | ICD-10-CM | POA: Diagnosis not present

## 2016-06-17 DIAGNOSIS — E559 Vitamin D deficiency, unspecified: Secondary | ICD-10-CM | POA: Diagnosis not present

## 2016-06-17 DIAGNOSIS — E119 Type 2 diabetes mellitus without complications: Secondary | ICD-10-CM | POA: Diagnosis not present

## 2016-06-17 DIAGNOSIS — R791 Abnormal coagulation profile: Secondary | ICD-10-CM | POA: Diagnosis not present

## 2016-06-17 DIAGNOSIS — Z79899 Other long term (current) drug therapy: Secondary | ICD-10-CM | POA: Diagnosis not present

## 2016-06-17 DIAGNOSIS — Z1389 Encounter for screening for other disorder: Secondary | ICD-10-CM | POA: Diagnosis not present

## 2016-06-17 DIAGNOSIS — Z9181 History of falling: Secondary | ICD-10-CM | POA: Diagnosis not present

## 2016-06-17 DIAGNOSIS — I10 Essential (primary) hypertension: Secondary | ICD-10-CM | POA: Diagnosis not present

## 2016-06-17 DIAGNOSIS — I2699 Other pulmonary embolism without acute cor pulmonale: Secondary | ICD-10-CM | POA: Diagnosis not present

## 2016-06-17 DIAGNOSIS — E785 Hyperlipidemia, unspecified: Secondary | ICD-10-CM | POA: Diagnosis not present

## 2016-06-17 DIAGNOSIS — Z6831 Body mass index (BMI) 31.0-31.9, adult: Secondary | ICD-10-CM | POA: Diagnosis not present

## 2016-06-17 DIAGNOSIS — I80202 Phlebitis and thrombophlebitis of unspecified deep vessels of left lower extremity: Secondary | ICD-10-CM | POA: Diagnosis not present

## 2016-07-16 DIAGNOSIS — Z7901 Long term (current) use of anticoagulants: Secondary | ICD-10-CM | POA: Diagnosis not present

## 2016-08-14 DIAGNOSIS — D0439 Carcinoma in situ of skin of other parts of face: Secondary | ICD-10-CM | POA: Diagnosis not present

## 2016-08-14 DIAGNOSIS — D485 Neoplasm of uncertain behavior of skin: Secondary | ICD-10-CM | POA: Diagnosis not present

## 2016-08-14 DIAGNOSIS — I872 Venous insufficiency (chronic) (peripheral): Secondary | ICD-10-CM | POA: Diagnosis not present

## 2016-08-14 DIAGNOSIS — L570 Actinic keratosis: Secondary | ICD-10-CM | POA: Diagnosis not present

## 2016-08-14 DIAGNOSIS — Z23 Encounter for immunization: Secondary | ICD-10-CM | POA: Diagnosis not present

## 2016-08-14 DIAGNOSIS — L820 Inflamed seborrheic keratosis: Secondary | ICD-10-CM | POA: Diagnosis not present

## 2016-08-16 DIAGNOSIS — Z7901 Long term (current) use of anticoagulants: Secondary | ICD-10-CM | POA: Diagnosis not present

## 2016-09-02 DIAGNOSIS — D0439 Carcinoma in situ of skin of other parts of face: Secondary | ICD-10-CM | POA: Diagnosis not present

## 2016-09-02 DIAGNOSIS — L820 Inflamed seborrheic keratosis: Secondary | ICD-10-CM | POA: Diagnosis not present

## 2016-09-17 DIAGNOSIS — E785 Hyperlipidemia, unspecified: Secondary | ICD-10-CM | POA: Diagnosis not present

## 2016-09-17 DIAGNOSIS — I2699 Other pulmonary embolism without acute cor pulmonale: Secondary | ICD-10-CM | POA: Diagnosis not present

## 2016-09-17 DIAGNOSIS — I10 Essential (primary) hypertension: Secondary | ICD-10-CM | POA: Diagnosis not present

## 2016-09-17 DIAGNOSIS — I80202 Phlebitis and thrombophlebitis of unspecified deep vessels of left lower extremity: Secondary | ICD-10-CM | POA: Diagnosis not present

## 2016-09-17 DIAGNOSIS — Z79899 Other long term (current) drug therapy: Secondary | ICD-10-CM | POA: Diagnosis not present

## 2016-09-17 DIAGNOSIS — Z7901 Long term (current) use of anticoagulants: Secondary | ICD-10-CM | POA: Diagnosis not present

## 2016-09-17 DIAGNOSIS — E559 Vitamin D deficiency, unspecified: Secondary | ICD-10-CM | POA: Diagnosis not present

## 2016-09-17 DIAGNOSIS — E119 Type 2 diabetes mellitus without complications: Secondary | ICD-10-CM | POA: Diagnosis not present

## 2016-09-23 DIAGNOSIS — J019 Acute sinusitis, unspecified: Secondary | ICD-10-CM | POA: Diagnosis not present

## 2016-09-23 DIAGNOSIS — Z6832 Body mass index (BMI) 32.0-32.9, adult: Secondary | ICD-10-CM | POA: Diagnosis not present

## 2016-10-21 DIAGNOSIS — Z7901 Long term (current) use of anticoagulants: Secondary | ICD-10-CM | POA: Diagnosis not present

## 2016-10-28 DIAGNOSIS — R791 Abnormal coagulation profile: Secondary | ICD-10-CM | POA: Diagnosis not present

## 2016-11-11 DIAGNOSIS — J208 Acute bronchitis due to other specified organisms: Secondary | ICD-10-CM | POA: Diagnosis not present

## 2016-11-11 DIAGNOSIS — R6889 Other general symptoms and signs: Secondary | ICD-10-CM | POA: Diagnosis not present

## 2016-11-11 DIAGNOSIS — Z6831 Body mass index (BMI) 31.0-31.9, adult: Secondary | ICD-10-CM | POA: Diagnosis not present

## 2016-11-18 DIAGNOSIS — R791 Abnormal coagulation profile: Secondary | ICD-10-CM | POA: Diagnosis not present

## 2016-11-25 DIAGNOSIS — R791 Abnormal coagulation profile: Secondary | ICD-10-CM | POA: Diagnosis not present

## 2016-12-02 DIAGNOSIS — R791 Abnormal coagulation profile: Secondary | ICD-10-CM | POA: Diagnosis not present

## 2016-12-19 DIAGNOSIS — Z79899 Other long term (current) drug therapy: Secondary | ICD-10-CM | POA: Diagnosis not present

## 2016-12-19 DIAGNOSIS — R05 Cough: Secondary | ICD-10-CM | POA: Diagnosis not present

## 2016-12-19 DIAGNOSIS — Z139 Encounter for screening, unspecified: Secondary | ICD-10-CM | POA: Diagnosis not present

## 2016-12-19 DIAGNOSIS — Z6831 Body mass index (BMI) 31.0-31.9, adult: Secondary | ICD-10-CM | POA: Diagnosis not present

## 2016-12-19 DIAGNOSIS — R791 Abnormal coagulation profile: Secondary | ICD-10-CM | POA: Diagnosis not present

## 2016-12-19 DIAGNOSIS — E785 Hyperlipidemia, unspecified: Secondary | ICD-10-CM | POA: Diagnosis not present

## 2016-12-19 DIAGNOSIS — Z23 Encounter for immunization: Secondary | ICD-10-CM | POA: Diagnosis not present

## 2016-12-19 DIAGNOSIS — I80202 Phlebitis and thrombophlebitis of unspecified deep vessels of left lower extremity: Secondary | ICD-10-CM | POA: Diagnosis not present

## 2016-12-19 DIAGNOSIS — E119 Type 2 diabetes mellitus without complications: Secondary | ICD-10-CM | POA: Diagnosis not present

## 2016-12-19 DIAGNOSIS — I2699 Other pulmonary embolism without acute cor pulmonale: Secondary | ICD-10-CM | POA: Diagnosis not present

## 2016-12-19 DIAGNOSIS — E559 Vitamin D deficiency, unspecified: Secondary | ICD-10-CM | POA: Diagnosis not present

## 2016-12-19 DIAGNOSIS — I10 Essential (primary) hypertension: Secondary | ICD-10-CM | POA: Diagnosis not present

## 2016-12-19 LAB — HM DIABETES FOOT EXAM

## 2016-12-23 DIAGNOSIS — J208 Acute bronchitis due to other specified organisms: Secondary | ICD-10-CM | POA: Diagnosis not present

## 2016-12-23 DIAGNOSIS — R6889 Other general symptoms and signs: Secondary | ICD-10-CM | POA: Diagnosis not present

## 2016-12-28 DIAGNOSIS — S39011A Strain of muscle, fascia and tendon of abdomen, initial encounter: Secondary | ICD-10-CM | POA: Diagnosis not present

## 2016-12-28 DIAGNOSIS — I700 Atherosclerosis of aorta: Secondary | ICD-10-CM | POA: Diagnosis not present

## 2016-12-28 DIAGNOSIS — R0602 Shortness of breath: Secondary | ICD-10-CM | POA: Diagnosis not present

## 2016-12-28 DIAGNOSIS — R1012 Left upper quadrant pain: Secondary | ICD-10-CM | POA: Diagnosis not present

## 2016-12-28 DIAGNOSIS — R0600 Dyspnea, unspecified: Secondary | ICD-10-CM | POA: Diagnosis not present

## 2016-12-28 DIAGNOSIS — X58XXXA Exposure to other specified factors, initial encounter: Secondary | ICD-10-CM | POA: Diagnosis not present

## 2016-12-28 DIAGNOSIS — R05 Cough: Secondary | ICD-10-CM | POA: Diagnosis not present

## 2017-01-02 DIAGNOSIS — R791 Abnormal coagulation profile: Secondary | ICD-10-CM | POA: Diagnosis not present

## 2017-01-03 DIAGNOSIS — S46322A Laceration of muscle, fascia and tendon of triceps, left arm, initial encounter: Secondary | ICD-10-CM | POA: Diagnosis not present

## 2017-01-06 DIAGNOSIS — S46322A Laceration of muscle, fascia and tendon of triceps, left arm, initial encounter: Secondary | ICD-10-CM | POA: Diagnosis not present

## 2017-01-09 DIAGNOSIS — R791 Abnormal coagulation profile: Secondary | ICD-10-CM | POA: Diagnosis not present

## 2017-01-16 DIAGNOSIS — R791 Abnormal coagulation profile: Secondary | ICD-10-CM | POA: Diagnosis not present

## 2017-01-23 DIAGNOSIS — R791 Abnormal coagulation profile: Secondary | ICD-10-CM | POA: Diagnosis not present

## 2017-01-27 DIAGNOSIS — L814 Other melanin hyperpigmentation: Secondary | ICD-10-CM | POA: Diagnosis not present

## 2017-01-27 DIAGNOSIS — L821 Other seborrheic keratosis: Secondary | ICD-10-CM | POA: Diagnosis not present

## 2017-01-27 DIAGNOSIS — L570 Actinic keratosis: Secondary | ICD-10-CM | POA: Diagnosis not present

## 2017-01-27 DIAGNOSIS — D1801 Hemangioma of skin and subcutaneous tissue: Secondary | ICD-10-CM | POA: Diagnosis not present

## 2017-01-27 DIAGNOSIS — Z85828 Personal history of other malignant neoplasm of skin: Secondary | ICD-10-CM | POA: Diagnosis not present

## 2017-01-27 DIAGNOSIS — D225 Melanocytic nevi of trunk: Secondary | ICD-10-CM | POA: Diagnosis not present

## 2017-01-30 DIAGNOSIS — R791 Abnormal coagulation profile: Secondary | ICD-10-CM | POA: Diagnosis not present

## 2017-02-13 DIAGNOSIS — R791 Abnormal coagulation profile: Secondary | ICD-10-CM | POA: Diagnosis not present

## 2017-02-20 DIAGNOSIS — R791 Abnormal coagulation profile: Secondary | ICD-10-CM | POA: Diagnosis not present

## 2017-02-25 DIAGNOSIS — Z9181 History of falling: Secondary | ICD-10-CM | POA: Diagnosis not present

## 2017-02-25 DIAGNOSIS — Z1389 Encounter for screening for other disorder: Secondary | ICD-10-CM | POA: Diagnosis not present

## 2017-02-25 DIAGNOSIS — E785 Hyperlipidemia, unspecified: Secondary | ICD-10-CM | POA: Diagnosis not present

## 2017-02-25 DIAGNOSIS — Z136 Encounter for screening for cardiovascular disorders: Secondary | ICD-10-CM | POA: Diagnosis not present

## 2017-02-25 DIAGNOSIS — Z0000 Encounter for general adult medical examination without abnormal findings: Secondary | ICD-10-CM | POA: Diagnosis not present

## 2017-02-25 DIAGNOSIS — Z6832 Body mass index (BMI) 32.0-32.9, adult: Secondary | ICD-10-CM | POA: Diagnosis not present

## 2017-02-25 DIAGNOSIS — Z125 Encounter for screening for malignant neoplasm of prostate: Secondary | ICD-10-CM | POA: Diagnosis not present

## 2017-03-06 DIAGNOSIS — R791 Abnormal coagulation profile: Secondary | ICD-10-CM | POA: Diagnosis not present

## 2017-03-28 DIAGNOSIS — E785 Hyperlipidemia, unspecified: Secondary | ICD-10-CM | POA: Diagnosis not present

## 2017-03-28 DIAGNOSIS — Z6832 Body mass index (BMI) 32.0-32.9, adult: Secondary | ICD-10-CM | POA: Diagnosis not present

## 2017-03-28 DIAGNOSIS — Z79899 Other long term (current) drug therapy: Secondary | ICD-10-CM | POA: Diagnosis not present

## 2017-03-28 DIAGNOSIS — E559 Vitamin D deficiency, unspecified: Secondary | ICD-10-CM | POA: Diagnosis not present

## 2017-03-28 DIAGNOSIS — I10 Essential (primary) hypertension: Secondary | ICD-10-CM | POA: Diagnosis not present

## 2017-03-28 DIAGNOSIS — E119 Type 2 diabetes mellitus without complications: Secondary | ICD-10-CM | POA: Diagnosis not present

## 2017-03-28 DIAGNOSIS — Z139 Encounter for screening, unspecified: Secondary | ICD-10-CM | POA: Diagnosis not present

## 2017-03-28 DIAGNOSIS — I2699 Other pulmonary embolism without acute cor pulmonale: Secondary | ICD-10-CM | POA: Diagnosis not present

## 2017-03-28 DIAGNOSIS — I80202 Phlebitis and thrombophlebitis of unspecified deep vessels of left lower extremity: Secondary | ICD-10-CM | POA: Diagnosis not present

## 2017-03-28 DIAGNOSIS — H6123 Impacted cerumen, bilateral: Secondary | ICD-10-CM | POA: Diagnosis not present

## 2017-03-28 LAB — CBC AND DIFFERENTIAL
HCT: 40 — AB (ref 41–53)
Hemoglobin: 13.1 — AB (ref 13.5–17.5)
Neutrophils Absolute: 4
Platelets: 212 (ref 150–399)
WBC: 6.8

## 2017-03-28 LAB — LIPID PANEL
Cholesterol: 193 (ref 0–200)
HDL: 59 (ref 35–70)
LDL Cholesterol: 95
Triglycerides: 195 — AB (ref 40–160)

## 2017-03-28 LAB — BASIC METABOLIC PANEL
BUN: 20 (ref 4–21)
Creatinine: 1.1 (ref 0.6–1.3)
Glucose: 135
Potassium: 4.4 (ref 3.4–5.3)
Sodium: 141 (ref 137–147)

## 2017-03-28 LAB — HEPATIC FUNCTION PANEL
ALT: 49 — AB (ref 10–40)
AST: 32 (ref 14–40)
Alkaline Phosphatase: 63 (ref 25–125)
Bilirubin, Total: 0.5

## 2017-03-28 LAB — VITAMIN D 25 HYDROXY (VIT D DEFICIENCY, FRACTURES): Vit D, 25-Hydroxy: 48.5

## 2017-04-07 DIAGNOSIS — Z7901 Long term (current) use of anticoagulants: Secondary | ICD-10-CM | POA: Diagnosis not present

## 2017-04-08 DIAGNOSIS — R791 Abnormal coagulation profile: Secondary | ICD-10-CM | POA: Diagnosis not present

## 2017-04-15 DIAGNOSIS — R791 Abnormal coagulation profile: Secondary | ICD-10-CM | POA: Diagnosis not present

## 2017-04-22 DIAGNOSIS — Z7901 Long term (current) use of anticoagulants: Secondary | ICD-10-CM | POA: Diagnosis not present

## 2017-05-23 DIAGNOSIS — Z791 Long term (current) use of non-steroidal anti-inflammatories (NSAID): Secondary | ICD-10-CM | POA: Diagnosis not present

## 2017-05-29 ENCOUNTER — Encounter: Payer: Self-pay | Admitting: Adult Health

## 2017-05-29 ENCOUNTER — Ambulatory Visit (INDEPENDENT_AMBULATORY_CARE_PROVIDER_SITE_OTHER): Payer: PPO | Admitting: Adult Health

## 2017-05-29 VITALS — BP 136/70 | Temp 98.5°F | Ht 67.25 in | Wt 207.0 lb

## 2017-05-29 DIAGNOSIS — Z7689 Persons encountering health services in other specified circumstances: Secondary | ICD-10-CM

## 2017-05-29 DIAGNOSIS — M542 Cervicalgia: Secondary | ICD-10-CM | POA: Diagnosis not present

## 2017-05-29 DIAGNOSIS — Z7901 Long term (current) use of anticoagulants: Secondary | ICD-10-CM

## 2017-05-29 DIAGNOSIS — G8929 Other chronic pain: Secondary | ICD-10-CM

## 2017-05-29 DIAGNOSIS — C61 Malignant neoplasm of prostate: Secondary | ICD-10-CM | POA: Diagnosis not present

## 2017-05-29 DIAGNOSIS — Z8546 Personal history of malignant neoplasm of prostate: Secondary | ICD-10-CM | POA: Insufficient documentation

## 2017-05-29 DIAGNOSIS — I10 Essential (primary) hypertension: Secondary | ICD-10-CM

## 2017-05-29 NOTE — Progress Notes (Signed)
Patient presents to clinic today to establish care. He is a pleasant 83 year old male who  has a past medical history of Bilateral pulmonary embolism ([place]); Chronic cough; DVT (deep venous thrombosis) ([place]); Gastrointestinal bleed; H/O cardiovascular stress test (05/07/2011); H/O Doppler ultrasound (2013); H/O Doppler ultrasound (2012); H/O echocardiogram (03/14/2011); H/O exercise stress test (2006); Hiatal hernia; Hypertension; and Prostate cancer ([place]).   He is currently   Acute Concerns: Establish Care   Chronic Issues: HTN - He takes Cozaar 100 mg and HCTZ 25 mg   Hx: of DVT and bilateral pulmonary embolism.for which he takes warfarin. It sounds like he was followed by Cardiology in [place] but would like to see cardiology here and to talk to them about coming off Warfarin for another blood thinner.   Prostate Cancer - s/p radiation in 2009 he is followed by Urology   Chronic Neck Pain - Has been seen by Dr. Ellene Route in the past and was given steroid shots but he does not feel like this worked well, this was three years ago. He continues to have pain and feels as though his pain and mobility are becoming worse.   Health Maintenance: Dental -- Does not do routine care  Vision -- Routine at [place] they need second pneumonia shot  Colonoscopy -- Aged out   He is followed by   Cardiology - in [facility]  Urology - Dr. Diona Fanti    Past Medical History:  Diagnosis Date  . Bilateral pulmonary embolism (HCC)    lovenox & coumadin thearpy  . Chronic cough   . DVT (deep venous thrombosis) (HCC)    left lower  . Gastrointestinal bleed    prior  . H/O cardiovascular stress test 05/07/2011   normal study, low risk scan  . H/O Doppler ultrasound 2013   venous duplex doppler  . H/O Doppler ultrasound 2012   venous duplex doppler  . H/O echocardiogram 03/14/2011   EF 65-70%  . H/O exercise stress test 2006   neg bruce protocol excercise stress test  . Hiatal hernia    . Hypertension   . Prostate cancer [facility])    s/p radiation    Past Surgical History:  Procedure Laterality Date  . event monitor     2012  . HERNIA REPAIR Bilateral    1991, 1992  . HIATAL HERNIA REPAIR     1997  . PROSTATE SURGERY      Current Outpatient Prescriptions on File Prior to Visit  Medication Sig Dispense Refill  . hydrochlorothiazide (HYDRODIURIL) 25 MG tablet Take 25 mg by mouth daily.    Marland Kitchen losartan (COZAAR) 100 MG tablet Take 100 mg by mouth daily.    . Omega-3 Fatty Acids (FISH OIL PO) Take 1 capsule by mouth 2 (two) times daily.    Marland Kitchen omeprazole (PRILOSEC) 20 MG capsule Take 20 mg by mouth daily.     No current facility-administered medications on file prior to visit.     Allergies  Allergen Reactions  . Lidocaine     ANTI ITCH CREAM, NO SPECIFICATIONS IN RECORDS.  Marland Kitchen Lisinopril     cough    Family History  Problem Relation Age of Onset  . Stroke Mother   . Kidney disease Father   . Heart disease Brother   . Arthritis Brother   . Heart disease Brother   . Prostate cancer Brother     Social History   Social History  . Marital status: Married  Spouse name: N/A  . Number of children: N/A  . Years of education: N/A   Occupational History  . Not on file.   Social History Main Topics  . Smoking status: Former Smoker    Packs/day: 0.75    Years: 20.00  . Smokeless tobacco: Never Used  . Alcohol use Not on file  . Drug use: Unknown  . Sexual activity: Not on file   Other Topics Concern  . Not on file   Social History Narrative  . No narrative on file    Review of Systems  Constitutional: Negative.   HENT: Negative.   Eyes: Negative.   Respiratory: Negative.   Cardiovascular: Negative.   Gastrointestinal: Negative.   Genitourinary: Negative.   Musculoskeletal: Positive for joint pain and neck pain.  Skin: Negative.   Neurological: Negative.   Endo/Heme/Allergies: Negative.   Psychiatric/Behavioral: Negative.   All other  systems reviewed and are negative.  Past Medical History:  Diagnosis Date  . Bilateral pulmonary embolism (HCC)    lovenox & coumadin thearpy  . Chronic cough   . DVT (deep venous thrombosis) (HCC)    left lower  . Gastrointestinal bleed    prior  . H/O cardiovascular stress test 05/07/2011   normal study, low risk scan  . H/O Doppler ultrasound 2013   venous duplex doppler  . H/O Doppler ultrasound 2012   venous duplex doppler  . H/O echocardiogram 03/14/2011   EF 65-70%  . H/O exercise stress test 2006   neg bruce protocol excercise stress test  . Hiatal hernia   . Hypertension   . Prostate cancer [facility])    s/p radiation    Social History   Social History  . Marital status: Married    Spouse name: N/A  . Number of children: N/A  . Years of education: N/A   Occupational History  . Not on file.   Social History Main Topics  . Smoking status: Former Smoker    Packs/day: 0.75    Years: 20.00  . Smokeless tobacco: Never Used  . Alcohol use Not on file  . Drug use: Unknown  . Sexual activity: Not on file   Other Topics Concern  . Not on file   Social History Narrative  . No narrative on file    Past Surgical History:  Procedure Laterality Date  . event monitor     2012  . HERNIA REPAIR Bilateral    1991, 1992  . HIATAL HERNIA REPAIR     1997  . PROSTATE SURGERY      Family History  Problem Relation Age of Onset  . Stroke Mother   . Kidney disease Father   . Heart disease Brother   . Arthritis Brother   . Heart disease Brother   . Prostate cancer Brother     Allergies  Allergen Reactions  . Lidocaine     ANTI ITCH CREAM, NO SPECIFICATIONS IN RECORDS.  Marland Kitchen Lisinopril     cough    Current Outpatient Prescriptions on File Prior to Visit  Medication Sig Dispense Refill  . hydrochlorothiazide (HYDRODIURIL) 25 MG tablet Take 25 mg by mouth daily.    Marland Kitchen losartan (COZAAR) 100 MG tablet Take 100 mg by mouth daily.    . Omega-3 Fatty Acids (FISH OIL  PO) Take 1 capsule by mouth 2 (two) times daily.    Marland Kitchen omeprazole (PRILOSEC) 20 MG capsule Take 20 mg by mouth daily.     No current facility-administered medications on  file prior to visit.     BP 136/70 (BP Location: Left Arm)   Temp 98.5 F (36.9 C) (Oral)   Ht 5' 7.25" (1.708 m)   Wt 207 lb (93.9 kg)   BMI 32.18 kg/m    Physical Exam  Constitutional: He is oriented to person, place, and time and well-developed, well-nourished, and in no distress. No distress.  HENT:  Head: Normocephalic and atraumatic.  Right Ear: External ear normal.  Left Ear: External ear normal.  Nose: Nose normal.  Mouth/Throat: Oropharynx is clear and moist. No oropharyngeal exudate.  Eyes: Pupils are equal, round, and reactive to light. Conjunctivae and EOM are normal. Right eye exhibits no discharge. Left eye exhibits no discharge. No scleral icterus.  Neck: Normal range of motion. Neck supple. No JVD present. No tracheal deviation present. No thyromegaly present.  Cardiovascular: Normal rate, regular rhythm, normal heart sounds and intact distal pulses.  Exam reveals no gallop and no friction rub.   No murmur heard. Pulmonary/Chest: Effort normal and breath sounds normal. No stridor. No respiratory distress. He has no wheezes. He has no rales. He exhibits no tenderness.  Abdominal: Soft. Bowel sounds are normal. He exhibits no distension and no mass. There is no tenderness. There is no rebound and no guarding.  Obese    Genitourinary:  Genitourinary Comments: Deferred due to age    Musculoskeletal: Normal range of motion. He exhibits no edema, tenderness or deformity.  Lymphadenopathy:    He has no cervical adenopathy.  Neurological: He is alert and oriented to person, place, and time. He displays normal reflexes. No cranial nerve deficit. He exhibits normal muscle tone. Gait normal. Coordination normal. GCS score is 15.  Skin: Skin is warm and dry. No rash noted. He is not diaphoretic. No erythema.  No pallor.  Psychiatric: Mood, memory, affect and judgment normal.  Nursing note and vitals reviewed.   Assessment/Plan:  1. Encounter to establish care - Will request records form previous provider  - Follow up for CPE or for any acute issues  2. Prostate cancer ([place]) - Follow up with Urology as directed   3. Essential hypertension - Appears well controlled on current medication  - No change   4. Chronic neck pain  - AMB referral to orthopedics  5. Chronic anticoagulation  - Ambulatory referral to Cardiology   [name], NP

## 2017-05-29 NOTE — Patient Instructions (Signed)
It was great meeting you today   Someone from orthopedics and cardiology will call you to schedule your appointment   Please have your Dr. In Tia Alert send me the notes from his office   If you need anything, please let me know

## 2017-06-02 DIAGNOSIS — C61 Malignant neoplasm of prostate: Secondary | ICD-10-CM | POA: Diagnosis not present

## 2017-06-02 DIAGNOSIS — R3915 Urgency of urination: Secondary | ICD-10-CM | POA: Diagnosis not present

## 2017-06-02 DIAGNOSIS — N5201 Erectile dysfunction due to arterial insufficiency: Secondary | ICD-10-CM | POA: Diagnosis not present

## 2017-06-02 DIAGNOSIS — E291 Testicular hypofunction: Secondary | ICD-10-CM | POA: Diagnosis not present

## 2017-06-02 DIAGNOSIS — R350 Frequency of micturition: Secondary | ICD-10-CM | POA: Diagnosis not present

## 2017-06-03 ENCOUNTER — Ambulatory Visit (INDEPENDENT_AMBULATORY_CARE_PROVIDER_SITE_OTHER): Payer: PPO | Admitting: Orthopaedic Surgery

## 2017-06-03 ENCOUNTER — Encounter (INDEPENDENT_AMBULATORY_CARE_PROVIDER_SITE_OTHER): Payer: Self-pay | Admitting: Orthopaedic Surgery

## 2017-06-03 ENCOUNTER — Ambulatory Visit (INDEPENDENT_AMBULATORY_CARE_PROVIDER_SITE_OTHER): Payer: PPO

## 2017-06-03 DIAGNOSIS — M542 Cervicalgia: Secondary | ICD-10-CM | POA: Diagnosis not present

## 2017-06-03 MED ORDER — ACETAMINOPHEN-CODEINE #3 300-30 MG PO TABS: 1.0000 | ORAL_TABLET | ORAL | 0 refills | Status: DC | PRN

## 2017-06-03 NOTE — Progress Notes (Signed)
Office Visit Note   Patient: FREDRIK MOGEL           Date of Birth: 10/20/1932           MRN: [id number] Visit Date: 06/03/2017              Requested by: [name], NP [place], [place] [address] PCP: [name], NP   Assessment & Plan: Visit Diagnoses:  1. Neck pain     Plan: Overall patient has multilevel spondylosis of the cervical spine. I agree that surgery is not indicated. Injections have failed to provide relief. Tylenol No. 3 was prescribed. If not better can consider pain management clinic referral. Questions encouraged and answered.  Follow-Up Instructions: Return if symptoms worsen or fail to improve.   Orders:  Orders Placed This Encounter  Procedures  . XR Cervical Spine 2 or 3 views   Meds ordered this encounter  Medications  . acetaminophen-codeine (TYLENOL #3) 300-30 MG tablet    Sig: Take 1 tablet by mouth every 4 (four) hours as needed for moderate pain.    Dispense:  30 tablet    Refill:  0      Procedures: No procedures performed   Clinical Data: No additional findings.   Subjective: Chief Complaint  Patient presents with  . Neck - Pain    Patient is a 83 year old gentleman with right knee pain for many years. He's had previous injections by the neurosurgeons without any relief. She takes Tylenol with partial relief. He is constant pain and has difficulty with turning his head. Denies any myelopathic signs or symptoms. He has seen Dr. Ellene Route in the past who stated he does not need surgery. He has occasional shooting pain down his arm.    Review of Systems  Constitutional: Negative.   All other systems reviewed and are negative.    Objective: Vital Signs: There were no vitals taken for this visit.  Physical Exam  Constitutional: He is oriented to person, place, and time. He appears well-developed and well-nourished.  HENT:  Head: Normocephalic and atraumatic.  Eyes: Pupils are equal, round, and  reactive to light.  Neck: Neck supple.  Pulmonary/Chest: Effort normal.  Abdominal: Soft.  Musculoskeletal: Normal range of motion.  Neurological: He is alert and oriented to person, place, and time.  Skin: Skin is warm.  Psychiatric: He has a normal mood and affect. His behavior is normal. Judgment and thought content normal.  Nursing note and vitals reviewed.   Ortho Exam Cervical spine exam shows significant limitation in rotation and flexion and extension. He has no Spurling sign. Negative Lhermitte. Specialty Comments:  No specialty comments available.  Imaging: Xr Cervical Spine 2 Or 3 Views  Result Date: 06/03/2017 Advanced spondylosis    PMFS History: Patient Active Problem List   Diagnosis Date Noted  . Prostate cancer ([place])   . Hypertension    Past Medical History:  Diagnosis Date  . Bilateral pulmonary embolism (HCC)    lovenox & coumadin thearpy  . Chronic cough   . DVT (deep venous thrombosis) (HCC)    left lower  . Gastrointestinal bleed    prior  . H/O cardiovascular stress test 05/07/2011   normal study, low risk scan  . H/O Doppler ultrasound 2013   venous duplex doppler  . H/O Doppler ultrasound 2012   venous duplex doppler  . H/O echocardiogram 03/14/2011   EF 65-70%  . H/O exercise stress test 2006   neg bruce protocol  excercise stress test  . Hiatal hernia   . Hypertension   . Prostate cancer [facility])    s/p radiation    Family History  Problem Relation Age of Onset  . Stroke Mother   . Kidney disease Father   . Heart disease Brother   . Arthritis Brother   . Heart disease Brother   . Prostate cancer Brother     Past Surgical History:  Procedure Laterality Date  . event monitor     2012  . HERNIA REPAIR Bilateral    1991, 1992  . HIATAL HERNIA REPAIR     1997  . IVC FILTER INSERTION    . PROSTATE SURGERY     Social History   Occupational History  . Not on file.   Social History Main Topics  . Smoking status: Former Smoker      Packs/day: 0.75    Years: 20.00  . Smokeless tobacco: Never Used  . Alcohol use No  . Drug use: No  . Sexual activity: Not on file

## 2017-06-06 DIAGNOSIS — R791 Abnormal coagulation profile: Secondary | ICD-10-CM | POA: Diagnosis not present

## 2017-06-10 ENCOUNTER — Ambulatory Visit (INDEPENDENT_AMBULATORY_CARE_PROVIDER_SITE_OTHER): Payer: Self-pay | Admitting: Orthopaedic Surgery

## 2017-06-12 ENCOUNTER — Encounter: Payer: Self-pay | Admitting: Family Medicine

## 2017-06-13 DIAGNOSIS — R791 Abnormal coagulation profile: Secondary | ICD-10-CM | POA: Diagnosis not present

## 2017-07-09 ENCOUNTER — Encounter: Payer: Self-pay | Admitting: Cardiovascular Disease

## 2017-07-09 ENCOUNTER — Ambulatory Visit (INDEPENDENT_AMBULATORY_CARE_PROVIDER_SITE_OTHER): Payer: PPO | Admitting: Cardiovascular Disease

## 2017-07-09 VITALS — BP 140/68 | HR 52 | Ht 67.0 in | Wt 202.0 lb

## 2017-07-09 DIAGNOSIS — I10 Essential (primary) hypertension: Secondary | ICD-10-CM

## 2017-07-09 DIAGNOSIS — I2699 Other pulmonary embolism without acute cor pulmonale: Secondary | ICD-10-CM | POA: Insufficient documentation

## 2017-07-09 NOTE — Assessment & Plan Note (Signed)
History of bilateral pulmonary and wasn't remotely on Coumadin anticoagulation until recently when he was transitioned to Eliquis . He does have an IVC filter that was placed in 2012. He's had no recurrent episodes.

## 2017-07-09 NOTE — Assessment & Plan Note (Signed)
History of essential hypertension blood pressures measured today at 140/68. He is on losartan and hydrochlorothiazide as well as amlodipine. Continue current meds at current dosing

## 2017-07-09 NOTE — Patient Instructions (Signed)

## 2017-07-09 NOTE — Progress Notes (Signed)
07/09/2017 Dakota Lewis   1933-01-12  [id number]  Primary Physician Dakota Lewis, Tommi Rumps, NP Primary Cardiologist: Lorretta Harp MD Lupe [place], [place]  HPI:  Dakota Lewis is a 83 y.o. male widowed Caucasian male father of 79, grandfather to 6 grandchildren and is accompanied by his daughter [name]. He was referred by Adriana Mccallum NP for cardiovascular evaluation because of prior pulmonary embolism. I apparently saw him a decade ago. He has a history of hypertension, and family history of heart disease with 2 brothers had bypass surgery. His never had a heart attack or stroke. He denies chest pain or shortness of breath. He apparently had bilateral pulmonary emboli approximately 6-10 years ago and has been on Coumadin anticoagulation since that time when he was transitioned to Eliquis  by his PCP. An IVC filter was apparently placed as well.   Current Meds  Medication Sig  . acetaminophen-codeine (TYLENOL #3) 300-30 MG tablet Take 1 tablet by mouth every 4 (four) hours as needed for moderate pain.  Marland Kitchen amLODipine (NORVASC) 10 MG tablet Take 10 mg by mouth daily.  Marland Kitchen apixaban (ELIQUIS) 5 MG TABS tablet Take 5 mg by mouth 2 (two) times daily.  . Ascorbic Acid (VITAMIN C) 1000 MG tablet Take 1,000 mg by mouth daily.  . Calcium Carb-Cholecalciferol (CALCIUM 600+D) 600-800 MG-UNIT TABS Take 1 tablet by mouth daily.  . Cholecalciferol (D3-1000 PO) Take 1 capsule by mouth daily.  . hydrochlorothiazide (HYDRODIURIL) 25 MG tablet Take 25 mg by mouth daily.  Marland Kitchen losartan (COZAAR) 100 MG tablet Take 100 mg by mouth daily.  . Multiple Vitamins-Minerals (PRESERVISION AREDS 2 PO) Take 1 tablet by mouth daily.  . Omega-3 Fatty Acids (FISH OIL PO) Take 1 capsule by mouth 2 (two) times daily.  Marland Kitchen omeprazole (PRILOSEC) 20 MG capsule Take 20 mg by mouth daily.  Marland Kitchen oxybutynin (DITROPAN) 5 MG tablet Take 5 mg by mouth 3 (three) times daily.  . potassium chloride (K-DUR,KLOR-CON) 10 MEQ tablet Take 10 mEq  by mouth daily.  Marland Kitchen warfarin (COUMADIN) 3 MG tablet Take 3 mg by mouth daily.  Marland Kitchen warfarin (COUMADIN) 4 MG tablet Take 1 tablet orally daily alternating with 3 mg tablet.     Allergies  Allergen Reactions  . Lidocaine     ANTI ITCH CREAM, NO SPECIFICATIONS IN RECORDS.  Marland Kitchen Lisinopril     cough    Social History   Social History  . Marital status: Married    Spouse name: N/A  . Number of children: N/A  . Years of education: N/A   Occupational History  . Not on file.   Social History Main Topics  . Smoking status: Former Smoker    Packs/day: 0.75    Years: 20.00  . Smokeless tobacco: Never Used  . Alcohol use No  . Drug use: No  . Sexual activity: Not on file   Other Topics Concern  . Not on file   Social History Narrative  . No narrative on file     Review of Systems: General: negative for chills, fever, night sweats or weight changes.  Cardiovascular: negative for chest pain, dyspnea on exertion, edema, orthopnea, palpitations, paroxysmal nocturnal dyspnea or shortness of breath Dermatological: negative for rash Respiratory: negative for cough or wheezing Urologic: negative for hematuria Abdominal: negative for nausea, vomiting, diarrhea, bright red blood per rectum, melena, or hematemesis Neurologic: negative for visual changes, syncope, or dizziness All other systems reviewed and are otherwise negative except as  noted above.    Blood pressure 140/68, pulse (!) 52, height 5' 7"  (1.702 m), weight 202 lb (91.6 kg).  General appearance: alert and no distress Neck: no adenopathy, no carotid bruit, no JVD, supple, symmetrical, trachea midline and thyroid not enlarged, symmetric, no tenderness/mass/nodules Lungs: clear to auscultation bilaterally Heart: regular rate and rhythm, S1, S2 normal, no murmur, click, rub or gallop Extremities: extremities normal, atraumatic, no cyanosis or edema Pulses: 2+ and symmetric Skin: Skin color, texture, turgor normal. No rashes or  lesions Neurologic: Alert and oriented X 3, normal strength and tone. Normal symmetric reflexes. Normal coordination and gait  EKG sinus bradycardia 52 ST or T-wave changes. I personally reviewed this EKG.  ASSESSMENT AND PLAN:   Hypertension History of essential hypertension blood pressures measured today at 140/68. He is on losartan and hydrochlorothiazide as well as amlodipine. Continue current meds at current dosing  Pulmonary embolism (HCC) History of bilateral pulmonary and wasn't remotely on Coumadin anticoagulation until recently when he was transitioned to Eliquis . He does have an IVC filter that was placed in 2012. He's had no recurrent episodes.      Lorretta Harp MD FACP,FACC,FAHA, [facility] 07/09/2017 10:16 AM

## 2017-08-21 ENCOUNTER — Telehealth: Payer: Self-pay | Admitting: Adult Health

## 2017-08-21 NOTE — Telephone Encounter (Signed)
Copied from [place] [id number]. Topic: Quick Communication - See Telephone Encounter >> Aug 21, 2017  4:10 PM Boyd Kerbs wrote: CRM for notification. See Telephone encounter for: Daughter [name]) [phone] called asking about appt with Dr. Carlisle Cater. They broke over Milbern's records a month ago and was told dr would go over records and call to set up appt. They have not heard anything as of yet  08/21/17.

## 2017-08-22 NOTE — Telephone Encounter (Signed)
Left a message on Dakota Lewis's identified cell informing her that pt will be due for cpx in June 2019.

## 2017-08-22 NOTE — Telephone Encounter (Signed)
Called and spoke to Williams.  She stated Tommi Rumps was to review records to see if pt had a physical completed by previous PCP Carolanne Grumbling) and then have office to call if pt needed to be scheduled.  I did find one note from Dr. Carolanne Grumbling in the media section of chart.  I do not see that it was a physical.  [name] states she dropped off records from Dr. Melina Fiddler office 1 month ago.  Will see if Tommi Rumps has office notes.

## 2017-08-22 NOTE — Telephone Encounter (Signed)
He will be due for his Physical in June 2019

## 2017-10-03 ENCOUNTER — Telehealth: Payer: Self-pay | Admitting: Adult Health

## 2017-10-03 NOTE — Telephone Encounter (Signed)
We are prescribing these. OK to refill for one year

## 2017-10-03 NOTE — Telephone Encounter (Signed)
Copied from [place] [id number]. Topic: Quick Communication - See Telephone Encounter >> Oct 03, 2017  3:20 PM Boyd Kerbs wrote: CRM for notification. See Telephone encounter for:   Prescription for [place] &  Potassium 100 mg refill needed Did not know if he needed to come in or not.  Last seen 05/2017. His last medicine is done in Monday. 12/31  [facility] [place], [place], [place] [address] Phone: [phone] Fax: [phone]    10/03/17.

## 2017-10-03 NOTE — Telephone Encounter (Signed)
Should cardiology be prescribing?

## 2017-10-03 NOTE — Telephone Encounter (Signed)
Patient is requesting refills on [place] and Potassium, last OV 05/2017 as a established new patient, new prescriptions will be needed.

## 2017-10-06 MED ORDER — POTASSIUM CHLORIDE CRYS ER 10 MEQ PO TBCR: 10.0000 meq | EXTENDED_RELEASE_TABLET | Freq: Every day | ORAL | 2 refills | Status: DC

## 2017-10-06 MED ORDER — LOSARTAN POTASSIUM 100 MG PO TABS: 100.0000 mg | ORAL_TABLET | Freq: Every day | ORAL | 2 refills | Status: DC

## 2017-10-06 NOTE — Telephone Encounter (Signed)
Sent to the pharmacy by e-scribe. 

## 2017-10-06 NOTE — Telephone Encounter (Signed)
Calling to check on refill request and would like a call once its called in [phone] [name])

## 2017-10-22 ENCOUNTER — Ambulatory Visit (INDEPENDENT_AMBULATORY_CARE_PROVIDER_SITE_OTHER): Payer: PPO | Admitting: Adult Health

## 2017-10-22 ENCOUNTER — Encounter: Payer: Self-pay | Admitting: Adult Health

## 2017-10-22 VITALS — BP 160/60 | Temp 98.0°F | Wt 200.0 lb

## 2017-10-22 DIAGNOSIS — R269 Unspecified abnormalities of gait and mobility: Secondary | ICD-10-CM | POA: Diagnosis not present

## 2017-10-22 DIAGNOSIS — T148XXA Other injury of unspecified body region, initial encounter: Secondary | ICD-10-CM | POA: Diagnosis not present

## 2017-10-22 DIAGNOSIS — R1030 Lower abdominal pain, unspecified: Secondary | ICD-10-CM | POA: Diagnosis not present

## 2017-10-22 DIAGNOSIS — R1032 Left lower quadrant pain: Secondary | ICD-10-CM | POA: Diagnosis not present

## 2017-10-22 DIAGNOSIS — Z760 Encounter for issue of repeat prescription: Secondary | ICD-10-CM | POA: Diagnosis not present

## 2017-10-22 LAB — CBC WITH DIFFERENTIAL/PLATELET
Basophils Absolute: 0.1 10*3/uL (ref 0.0–0.1)
Basophils Relative: 0.9 % (ref 0.0–3.0)
Eosinophils Absolute: 0.2 10*3/uL (ref 0.0–0.7)
Eosinophils Relative: 2.5 % (ref 0.0–5.0)
HCT: 40.4 % (ref 39.0–52.0)
Hemoglobin: 13.5 g/dL (ref 13.0–17.0)
Lymphocytes Relative: 30.3 % (ref 12.0–46.0)
Lymphs Abs: 2 10*3/uL (ref 0.7–4.0)
MCHC: 33.5 g/dL (ref 30.0–36.0)
MCV: 92.2 fl (ref 78.0–100.0)
Monocytes Absolute: 0.6 10*3/uL (ref 0.1–1.0)
Monocytes Relative: 9.6 % (ref 3.0–12.0)
Neutro Abs: 3.8 10*3/uL (ref 1.4–7.7)
Neutrophils Relative %: 56.7 % (ref 43.0–77.0)
Platelets: 184 10*3/uL (ref 150.0–400.0)
RBC: 4.38 Mil/uL (ref 4.22–5.81)
RDW: 13 % (ref 11.5–15.5)
WBC: 6.7 10*3/uL (ref 4.0–10.5)

## 2017-10-22 LAB — BASIC METABOLIC PANEL
BUN: 24 mg/dL — ABNORMAL HIGH (ref 6–23)
CO2: 28 mEq/L (ref 19–32)
Calcium: 10 mg/dL (ref 8.4–10.5)
Chloride: 98 mEq/L (ref 96–112)
Creatinine, Ser: 1.18 mg/dL (ref 0.40–1.50)
GFR: 62.68 mL/min (ref >60.00)
Glucose, Bld: 294 mg/dL — ABNORMAL HIGH (ref 70–99)
Potassium: 4.1 mEq/L (ref 3.5–5.1)
Sodium: 135 mEq/L (ref 135–145)

## 2017-10-22 LAB — POCT URINALYSIS DIPSTICK
Bilirubin, UA: NEGATIVE
Blood, UA: NEGATIVE
Glucose, UA: NEGATIVE
Ketones, UA: NEGATIVE
Leukocytes, UA: NEGATIVE
Nitrite, UA: NEGATIVE
Odor: NEGATIVE
Protein, UA: NEGATIVE
Spec Grav, UA: 1.025 (ref 1.010–1.025)
Urobilinogen, UA: 0.2 E.U./dL
pH, UA: 6 (ref 5.0–8.0)

## 2017-10-22 MED ORDER — AMLODIPINE BESYLATE 10 MG PO TABS: 10.0000 mg | ORAL_TABLET | Freq: Every day | ORAL | 3 refills | Status: DC

## 2017-10-22 MED ORDER — HYDROCHLOROTHIAZIDE 25 MG PO TABS: 25.0000 mg | ORAL_TABLET | Freq: Every day | ORAL | 3 refills | Status: DC

## 2017-10-22 MED ORDER — TIZANIDINE HCL 4 MG PO TABS: 4.0000 mg | ORAL_TABLET | Freq: Every day | ORAL | 0 refills | Status: DC

## 2017-10-22 MED ORDER — POTASSIUM CHLORIDE CRYS ER 10 MEQ PO TBCR: 10.0000 meq | EXTENDED_RELEASE_TABLET | Freq: Every day | ORAL | 2 refills | Status: DC

## 2017-10-22 NOTE — Progress Notes (Signed)
Subjective:    Patient ID: Dakota Lewis, male    DOB: [birth year], 84 y.o.   MRN: [id number]  HPI  84 year old male who  has a past medical history of Bilateral pulmonary embolism ([place]), Chronic cough, DVT (deep venous thrombosis) ([place]), Gastrointestinal bleed, H/O cardiovascular stress test (05/07/2011), H/O Doppler ultrasound (2013), H/O Doppler ultrasound (2012), H/O echocardiogram (03/14/2011), H/O exercise stress test (2006), Hiatal hernia, Hypertension, and Prostate cancer ([place]). He presents to the office today for the acute complaint of pain in his right inner thigh. He reports that this pain has been present for the last 3-4 months and described as a " burning pain" that radiates down his thigh. He denies any trauma or falls. He does report difficult getting out of bed and changing positions to where  he needs to stand.   Also complains of abdominal pain for an unknown amount of time. Pain is located over the umbilicus ( had previous hernia surgery in the 1990's). He does not describe the pain, just that " it hurts" sometimes. Has noticed instances where the skin around the umbilicus has " turned a blue tint". Denies any issues with urination. Has not had any constipation or diarrhea. He has not experienced any nausea, vomiting, or fevers.    Review of Systems  Constitutional: Negative.   Respiratory: Negative.   Cardiovascular: Negative.   Gastrointestinal: Positive for abdominal pain. Negative for abdominal distention, blood in stool, constipation, diarrhea, nausea, rectal pain and vomiting.  Genitourinary: Negative.   Musculoskeletal: Positive for myalgias.  Skin: Negative for color change.  All other systems reviewed and are negative.  Past Medical History:  Diagnosis Date  . Bilateral pulmonary embolism (HCC)    lovenox & coumadin thearpy  . Chronic cough   . DVT (deep venous thrombosis) (HCC)    left lower  . Gastrointestinal bleed    prior  . H/O cardiovascular stress test  05/07/2011   normal study, low risk scan  . H/O Doppler ultrasound 2013   venous duplex doppler  . H/O Doppler ultrasound 2012   venous duplex doppler  . H/O echocardiogram 03/14/2011   EF 65-70%  . H/O exercise stress test 2006   neg bruce protocol excercise stress test  . Hiatal hernia   . Hypertension   . Prostate cancer [facility])    s/p radiation    Social History   Socioeconomic History  . Marital status: Married    Spouse name: Not on file  . Number of children: Not on file  . Years of education: Not on file  . Highest education level: Not on file  Social Needs  . Financial resource strain: Not on file  . Food insecurity - worry: Not on file  . Food insecurity - inability: Not on file  . Transportation needs - medical: Not on file  . Transportation needs - non-medical: Not on file  Occupational History  . Not on file  Tobacco Use  . Smoking status: Former Smoker    Packs/day: 0.75    Years: 20.00    Pack years: 15.00  . Smokeless tobacco: Never Used  Substance and Sexual Activity  . Alcohol use: No  . Drug use: No  . Sexual activity: Not on file  Other Topics Concern  . Not on file  Social History Narrative  . Not on file    Past Surgical History:  Procedure Laterality Date  . event monitor     2012  . HERNIA REPAIR  Bilateral    1991, 1992  . HIATAL HERNIA REPAIR     1997  . IVC FILTER INSERTION    . PROSTATE SURGERY      Family History  Problem Relation Age of Onset  . Stroke Mother   . Kidney disease Father   . Heart disease Brother   . Arthritis Brother   . Heart disease Brother   . Prostate cancer Brother     Allergies  Allergen Reactions  . Lidocaine     ANTI ITCH CREAM, NO SPECIFICATIONS IN RECORDS.  Marland Kitchen Lisinopril     cough    Current Outpatient Medications on File Prior to Visit  Medication Sig Dispense Refill  . acetaminophen-codeine (TYLENOL #3) 300-30 MG tablet Take 1 tablet by mouth every 4 (four) hours as needed for moderate  pain. 30 tablet 0  . apixaban (ELIQUIS) 5 MG TABS tablet Take 5 mg by mouth 2 (two) times daily.    . Ascorbic Acid (VITAMIN C) 1000 MG tablet Take 1,000 mg by mouth daily.    . Calcium Carb-Cholecalciferol (CALCIUM 600+D) 600-800 MG-UNIT TABS Take 1 tablet by mouth daily.    . Cholecalciferol (D3-1000 PO) Take 1 capsule by mouth daily.    Marland Kitchen losartan (COZAAR) 100 MG tablet Take 1 tablet (100 mg total) by mouth daily. 90 tablet 2  . Multiple Vitamins-Minerals (PRESERVISION AREDS 2 PO) Take 1 tablet by mouth daily.    . Omega-3 Fatty Acids (FISH OIL PO) Take 1 capsule by mouth 2 (two) times daily.    Marland Kitchen omeprazole (PRILOSEC) 20 MG capsule Take 20 mg by mouth daily.    Marland Kitchen oxybutynin (DITROPAN) 5 MG tablet Take 5 mg by mouth 3 (three) times daily.     No current facility-administered medications on file prior to visit.     BP (!) 160/60 (BP Location: Left Arm)   Temp 98 F (36.7 C) (Oral)   Wt 200 lb (90.7 kg)   BMI 31.32 kg/m       Objective:   Physical Exam  Constitutional: He is oriented to person, place, and time. He appears well-developed and well-nourished. No distress.  Cardiovascular: Normal rate, regular rhythm, normal heart sounds and intact distal pulses. Exam reveals no gallop and no friction rub.  No murmur heard. Pulmonary/Chest: Effort normal and breath sounds normal. No respiratory distress. He has no wheezes. He has no rales. He exhibits no tenderness.  Abdominal: Soft. Normal appearance and bowel sounds are normal. He exhibits no distension and no mass. There is tenderness in the left upper quadrant and left lower quadrant. There is no rebound and no guarding. A hernia is present. Hernia confirmed positive in the ventral area.  Musculoskeletal: He exhibits tenderness (with palpation along right gracillis muslce ).  Pain with straight leg raise, knee to chest, and external rotation   Neurological: He is alert and oriented to person, place, and time.  Skin: Skin is warm and  dry. No rash noted. He is not diaphoretic. No erythema. No pallor.  No discoloration noted along abdomen   Psychiatric: He has a normal mood and affect. His behavior is normal. Judgment and thought content normal.  Nursing note and vitals reviewed.     Assessment & Plan:  1. Left lower quadrant pain - Patient very tender over left quadrants. No pain with palpation to umbilicus. No history of kidney stones, diverticulitis. No concern for bowel obstruction   - Basic Metabolic Panel - CBC with Differential/Platelet - POC Urinalysis Dipstick -  CT Abdomen Pelvis Wo Contrast; Future  2. Abnormality of gait  - Ambulatory referral to Physical Therapy  3. Muscle strain  - tiZANidine (ZANAFLEX) 4 MG tablet; Take 1 tablet (4 mg total) by mouth at bedtime.  Dispense: 7 tablet; Refill: 0  4. Medication refill  - amLODipine (NORVASC) 10 MG tablet; Take 1 tablet (10 mg total) by mouth daily.  Dispense: 90 tablet; Refill: 3 - hydrochlorothiazide (HYDRODIURIL) 25 MG tablet; Take 1 tablet (25 mg total) by mouth daily.  Dispense: 90 tablet; Refill: 3 - potassium chloride (K-DUR,KLOR-CON) 10 MEQ tablet; Take 1 tablet (10 mEq total) by mouth daily.  Dispense: 90 tablet; Refill: 2  [name], NP

## 2017-10-23 ENCOUNTER — Other Ambulatory Visit (INDEPENDENT_AMBULATORY_CARE_PROVIDER_SITE_OTHER): Payer: PPO

## 2017-10-23 ENCOUNTER — Telehealth: Payer: Self-pay | Admitting: Family Medicine

## 2017-10-23 DIAGNOSIS — R739 Hyperglycemia, unspecified: Secondary | ICD-10-CM | POA: Diagnosis not present

## 2017-10-23 LAB — HEMOGLOBIN A1C: Hgb A1c MFr Bld: 7.8 % — ABNORMAL HIGH (ref 4.6–6.5)

## 2017-10-23 NOTE — Telephone Encounter (Signed)
Copied from [place] [id number]. Topic: General - Other >> Oct 23, 2017  3:54 PM Ether Griffins B wrote: Reason for CRM: pts daughter [name] calling to let office know that her sister is in another state and she is a back up contact if no one can be reached. It's ok to contact her but she would like the office to know to please contact her [name] first.   >> Oct 23, 2017  3:56 PM Ether Griffins B wrote: She just wanted a note in his chart.

## 2017-10-29 DIAGNOSIS — M2560 Stiffness of unspecified joint, not elsewhere classified: Secondary | ICD-10-CM | POA: Diagnosis not present

## 2017-10-29 DIAGNOSIS — M25561 Pain in right knee: Secondary | ICD-10-CM | POA: Diagnosis not present

## 2017-10-29 DIAGNOSIS — R293 Abnormal posture: Secondary | ICD-10-CM | POA: Diagnosis not present

## 2017-10-29 DIAGNOSIS — R2681 Unsteadiness on feet: Secondary | ICD-10-CM | POA: Diagnosis not present

## 2017-10-29 DIAGNOSIS — M79651 Pain in right thigh: Secondary | ICD-10-CM | POA: Diagnosis not present

## 2017-10-29 DIAGNOSIS — R269 Unspecified abnormalities of gait and mobility: Secondary | ICD-10-CM | POA: Diagnosis not present

## 2017-10-29 DIAGNOSIS — M6281 Muscle weakness (generalized): Secondary | ICD-10-CM | POA: Diagnosis not present

## 2017-11-04 ENCOUNTER — Inpatient Hospital Stay: Admission: RE | Admit: 2017-11-04 | Payer: PPO | Source: Ambulatory Visit

## 2017-11-04 ENCOUNTER — Ambulatory Visit (INDEPENDENT_AMBULATORY_CARE_PROVIDER_SITE_OTHER)
Admission: RE | Admit: 2017-11-04 | Discharge: 2017-11-04 | Disposition: A | Discharge status: Home / Self Care | Payer: PPO | Source: Ambulatory Visit | Attending: Adult Health | Admitting: Adult Health

## 2017-11-04 DIAGNOSIS — R1032 Left lower quadrant pain: Secondary | ICD-10-CM | POA: Diagnosis not present

## 2017-11-04 DIAGNOSIS — R197 Diarrhea, unspecified: Secondary | ICD-10-CM | POA: Diagnosis not present

## 2017-11-04 MED ORDER — IOPAMIDOL (ISOVUE-300) INJECTION 61%
100.0000 mL | Freq: Once | INTRAVENOUS | Status: AC | PRN
  Administered 2017-11-04: 100 mL via INTRAVENOUS

## 2017-11-07 DIAGNOSIS — M6281 Muscle weakness (generalized): Secondary | ICD-10-CM | POA: Diagnosis not present

## 2017-11-07 DIAGNOSIS — M79651 Pain in right thigh: Secondary | ICD-10-CM | POA: Diagnosis not present

## 2017-11-07 DIAGNOSIS — M25651 Stiffness of right hip, not elsewhere classified: Secondary | ICD-10-CM | POA: Diagnosis not present

## 2017-11-07 DIAGNOSIS — R2681 Unsteadiness on feet: Secondary | ICD-10-CM | POA: Diagnosis not present

## 2017-11-07 DIAGNOSIS — R293 Abnormal posture: Secondary | ICD-10-CM | POA: Diagnosis not present

## 2017-11-07 DIAGNOSIS — R269 Unspecified abnormalities of gait and mobility: Secondary | ICD-10-CM | POA: Diagnosis not present

## 2017-11-07 DIAGNOSIS — M2560 Stiffness of unspecified joint, not elsewhere classified: Secondary | ICD-10-CM | POA: Diagnosis not present

## 2017-11-10 NOTE — Telephone Encounter (Signed)
Please call pt's daughter, [name], at [phone] with results.

## 2017-11-10 NOTE — Telephone Encounter (Signed)
Left message to call back regarding CT results

## 2017-11-11 ENCOUNTER — Telehealth: Payer: Self-pay | Admitting: Adult Health

## 2017-11-11 DIAGNOSIS — K7460 Unspecified cirrhosis of liver: Secondary | ICD-10-CM

## 2017-11-11 NOTE — Telephone Encounter (Signed)
Dakota Lewis (Patient) Dakota Lewis (Patient) Quick Communication - Office Called Patient  Reason for JRP:[id number] call from office

## 2017-11-11 NOTE — Telephone Encounter (Signed)
Dakota Lewis, did you call pt?

## 2017-11-11 NOTE — Telephone Encounter (Signed)
Spoke to daughter [name] and informed her on CT results. 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 Will send note to his cardiologist

## 2017-11-13 ENCOUNTER — Telehealth: Payer: Self-pay | Admitting: Family Medicine

## 2017-11-13 NOTE — Telephone Encounter (Signed)
Copied from [place] [phone]. Topic: Quick Communication - Office Called Patient >> Nov 13, 2017 12:20 PM Robina Ade, Helene Kelp D wrote: Ms. [name] patients daughter would like to talk to [facility] about his CT results. Please call patient back, thanks.   Tommi Rumps did you talk with daughter? I would like to close encounter if okay.

## 2017-11-19 ENCOUNTER — Telehealth: Payer: Self-pay | Admitting: Cardiovascular Disease

## 2017-11-19 NOTE — Telephone Encounter (Signed)
Can be seen sooner by mid-level provider if symptoms become worse.

## 2017-11-19 NOTE — Telephone Encounter (Signed)
Returned the call to the patient's daughter, per DPR. She stated that the patient has intermittent chest discomfort that does not last. It is mainly when moving his left arm. He also has been experiencing shortness of breath that is intermittent and mainly upon exertion. She stated that this has been going on for months now and is not a new problem. He has an appointment with Dr. Gwenlyn Found on 2/22. The daughter would like Dr. Kennon Holter opinion on whether or not he needs to be seen sooner. If the pain or shortness of breath worsens, the daughter sated that she will take her father to the ED.

## 2017-11-19 NOTE — Telephone Encounter (Signed)
New Message   Pt c/o of Chest Pain: STAT if CP now or developed within 24 hours  1. Are you having CP right now? no  2. Are you experiencing any other symptoms (ex. SOB, nausea, vomiting, sweating)? SOB from time to time   3. How long have you been experiencing CP? Several weeks  4. Is your CP continuous or coming and going? Coming and going   5. Have you taken Nitroglycerin? no ?  Daughter is calling stating that her father has been having intermittent chest pain. Mainly upon lifting his left arm. Please call to discuss.

## 2017-11-21 DIAGNOSIS — K7460 Unspecified cirrhosis of liver: Secondary | ICD-10-CM | POA: Diagnosis not present

## 2017-11-21 NOTE — Telephone Encounter (Signed)
I did

## 2017-11-28 ENCOUNTER — Ambulatory Visit (INDEPENDENT_AMBULATORY_CARE_PROVIDER_SITE_OTHER): Payer: PPO | Admitting: Cardiovascular Disease

## 2017-11-28 ENCOUNTER — Encounter: Payer: Self-pay | Admitting: Cardiovascular Disease

## 2017-11-28 DIAGNOSIS — R0789 Other chest pain: Secondary | ICD-10-CM

## 2017-11-28 NOTE — Patient Instructions (Signed)
Medication Instructions: Your physician recommends that you continue on your current medications as directed. Please refer to the Current Medication list given to you today.  If you need a refill on your cardiac medications before your next appointment, please call your pharmacy.    Procedures/Testing: Your physician has requested that you have an echocardiogram. Echocardiography is a painless test that uses sound waves to create images of your heart. It provides your doctor with information about the size and shape of your heart and how well your heart's chambers and valves are working. This procedure takes approximately one hour. There are no restrictions for this procedure. This will take place at [address], [address].  Your physician has requested that you have a lexiscan myoview. For further information please visit [email or web address]. Please follow instruction sheet, as given. This will take place at [address] [place], [address]   Follow-Up: Your physician wants you to follow-up in: 3 weeks with Dr. Gwenlyn Found.  Special Instructions:    Thank you for choosing [facility] at [facility]!!

## 2017-11-28 NOTE — Assessment & Plan Note (Signed)
Mr. Larmon returns today for follow-up of atypical chest pain which she's had over the last several months. He does have positive cardiac risk factors and a recent abdominal CT performed 11/04/17 that showed calcification in his coronary arteries, or his aortic valve and is a abdominal aorta. Based on this, I am going to proceed with 2-D echocardiography and pharmacologic Myoview stress testing.

## 2017-11-28 NOTE — Progress Notes (Signed)
11/28/2017 Dakota Lewis   1933/05/22  [id number]  Primary Physician Carlisle Cater, Tommi Rumps, NP Primary Cardiologist: Lorretta Harp MD Lupe Carney, [place]  HPI:  Dakota Lewis is a 84 y.o.  widowed Caucasian male father of 43, grandfather to 6 grandchildren and is accompanied by his daughter [name]. He was referred by Adriana Mccallum NP for cardiovascular evaluation because of prior pulmonary embolism. I apparently saw him a decade ago and last saw him in the office 07/09/17. He has a history of hypertension, and family history of heart disease with 2 brothers had bypass surgery. His never had a heart attack or stroke. He denies chest pain or shortness of breath. He apparently had bilateral pulmonary emboli approximately 6-10 years ago and has been on Coumadin anticoagulation since that time when he was transitioned to Eliquis  by his PCP. An IVC filter was apparently placed as well  He has been complaining of some atypical chest pain. Recent abdominal CT scan performed 11/04/17 revealed coronary calcification, "aortic valve calcification and abdominal aortic calcification.     Current Meds  Medication Sig  . acetaminophen-codeine (TYLENOL #3) 300-30 MG tablet Take 1 tablet by mouth every 4 (four) hours as needed for moderate pain.  Marland Kitchen amLODipine (NORVASC) 10 MG tablet Take 1 tablet (10 mg total) by mouth daily.  Marland Kitchen apixaban (ELIQUIS) 5 MG TABS tablet Take 5 mg by mouth 2 (two) times daily.  . Ascorbic Acid (VITAMIN C) 1000 MG tablet Take 1,000 mg by mouth daily.  . Calcium Carb-Cholecalciferol (CALCIUM 600+D) 600-800 MG-UNIT TABS Take 1 tablet by mouth daily.  . Cholecalciferol (D3-1000 PO) Take 1 capsule by mouth daily.  . hydrochlorothiazide (HYDRODIURIL) 25 MG tablet Take 1 tablet (25 mg total) by mouth daily.  Marland Kitchen losartan (COZAAR) 100 MG tablet Take 1 tablet (100 mg total) by mouth daily.  . Multiple Vitamins-Minerals (PRESERVISION AREDS 2 PO) Take 1 tablet by mouth daily.  .  Omega-3 Fatty Acids (FISH OIL PO) Take 1 capsule by mouth 2 (two) times daily.  Marland Kitchen omeprazole (PRILOSEC) 20 MG capsule Take 20 mg by mouth daily.  Marland Kitchen oxybutynin (DITROPAN) 5 MG tablet Take 5 mg by mouth 3 (three) times daily.  . potassium chloride (K-DUR,KLOR-CON) 10 MEQ tablet Take 1 tablet (10 mEq total) by mouth daily.     Allergies  Allergen Reactions  . Lidocaine     ANTI ITCH CREAM, NO SPECIFICATIONS IN RECORDS.  Marland Kitchen Lisinopril     cough    Social History   Socioeconomic History  . Marital status: Married    Spouse name: Not on file  . Number of children: Not on file  . Years of education: Not on file  . Highest education level: Not on file  Social Needs  . Financial resource strain: Not on file  . Food insecurity - worry: Not on file  . Food insecurity - inability: Not on file  . Transportation needs - medical: Not on file  . Transportation needs - non-medical: Not on file  Occupational History  . Not on file  Tobacco Use  . Smoking status: Former Smoker    Packs/day: 0.75    Years: 20.00    Pack years: 15.00  . Smokeless tobacco: Never Used  Substance and Sexual Activity  . Alcohol use: No  . Drug use: No  . Sexual activity: Not on file  Other Topics Concern  . Not on file  Social History Narrative  . Not on  file     Review of Systems: General: negative for chills, fever, night sweats or weight changes.  Cardiovascular: negative for chest pain, dyspnea on exertion, edema, orthopnea, palpitations, paroxysmal nocturnal dyspnea or shortness of breath Dermatological: negative for rash Respiratory: negative for cough or wheezing Urologic: negative for hematuria Abdominal: negative for nausea, vomiting, diarrhea, bright red blood per rectum, melena, or hematemesis Neurologic: negative for visual changes, syncope, or dizziness All other systems reviewed and are otherwise negative except as noted above.    Blood pressure (!) 143/62, pulse (!) 57, height 5' 7"   (1.702 m), weight 189 lb 12.8 oz (86.1 kg).  General appearance: alert and no distress Neck: no adenopathy, no carotid bruit, no JVD, supple, symmetrical, trachea midline and thyroid not enlarged, symmetric, no tenderness/mass/nodules Lungs: clear to auscultation bilaterally Heart: regular rate and rhythm, S1, S2 normal, no murmur, click, rub or gallop Extremities: extremities normal, atraumatic, no cyanosis or edema Pulses: 2+ and symmetric Skin: Skin color, texture, turgor normal. No rashes or lesions Neurologic: Alert and oriented X 3, normal strength and tone. Normal symmetric reflexes. Normal coordination and gait  EKG sinus bradycardia at 57 with left axis deviation. I personally reviewed this EKG.  ASSESSMENT AND PLAN:   Atypical chest pain Mr. Mceachron returns today for follow-up of atypical chest pain which she's had over the last several months. He does have positive cardiac risk factors and a recent abdominal CT performed 11/04/17 that showed calcification in his coronary arteries, or his aortic valve and is a abdominal aorta. Based on this, I am going to proceed with 2-D echocardiography and pharmacologic Myoview stress testing.      Lorretta Harp MD FACP,FACC,FAHA, [facility] 11/28/2017 9:16 AM

## 2017-12-03 ENCOUNTER — Ambulatory Visit (HOSPITAL_COMMUNITY): Payer: PPO | Attending: Cardiovascular Disease

## 2017-12-03 ENCOUNTER — Telehealth (HOSPITAL_COMMUNITY): Payer: Self-pay

## 2017-12-03 ENCOUNTER — Other Ambulatory Visit: Payer: Self-pay

## 2017-12-03 VITALS — BP 169/73

## 2017-12-03 DIAGNOSIS — R0789 Other chest pain: Secondary | ICD-10-CM | POA: Insufficient documentation

## 2017-12-03 DIAGNOSIS — I517 Cardiomegaly: Secondary | ICD-10-CM | POA: Insufficient documentation

## 2017-12-03 DIAGNOSIS — R079 Chest pain, unspecified: Secondary | ICD-10-CM | POA: Diagnosis present

## 2017-12-03 DIAGNOSIS — Z86711 Personal history of pulmonary embolism: Secondary | ICD-10-CM | POA: Diagnosis not present

## 2017-12-03 MED ORDER — PERFLUTREN LIPID MICROSPHERE
1.0000 mL | INTRAVENOUS | Status: AC | PRN
  Administered 2017-12-03: 1 mL via INTRAVENOUS

## 2017-12-03 NOTE — Telephone Encounter (Signed)
Encounter complete. 

## 2017-12-05 ENCOUNTER — Ambulatory Visit (HOSPITAL_COMMUNITY)
Admission: RE | Admit: 2017-12-05 | Discharge: 2017-12-05 | Disposition: A | Discharge status: Home / Self Care | Payer: PPO | Source: Ambulatory Visit | Attending: Cardiology | Admitting: Cardiology

## 2017-12-05 ENCOUNTER — Telehealth: Payer: Self-pay | Admitting: Cardiovascular Disease

## 2017-12-05 DIAGNOSIS — R0789 Other chest pain: Secondary | ICD-10-CM | POA: Insufficient documentation

## 2017-12-05 LAB — MYOCARDIAL PERFUSION IMAGING
LV dias vol: 91 mL (ref 62–150)
LV sys vol: 37 mL
Peak HR: 68 {beats}/min
Rest HR: 53 {beats}/min
SDS: 1
SRS: 1
TID: 1.04

## 2017-12-05 MED ORDER — TECHNETIUM TC 99M TETROFOSMIN IV KIT
11.0000 | PACK | Freq: Once | INTRAVENOUS | Status: AC | PRN
Start: 2017-12-05 — End: 2017-12-05
  Administered 2017-12-05: 11 via INTRAVENOUS
  Filled 2017-12-05: qty 11

## 2017-12-05 MED ORDER — REGADENOSON 0.4 MG/5ML IV SOLN
0.4000 mg | Freq: Once | INTRAVENOUS | Status: AC
  Administered 2017-12-05: 0.4 mg via INTRAVENOUS

## 2017-12-05 MED ORDER — TECHNETIUM TC 99M TETROFOSMIN IV KIT
32.9000 | PACK | Freq: Once | INTRAVENOUS | Status: AC | PRN
  Administered 2017-12-05: 32.9 via INTRAVENOUS
  Filled 2017-12-05: qty 33

## 2017-12-05 NOTE — Telephone Encounter (Signed)
New Message  Pts daughter verbalized wanting to speak to nurse about pt needing to come off Eliquis due to pt having root canal.  Pts daughter verbalized N[place] told daughter and patient to consult with [place] to see if he needs too.  Please f/u

## 2017-12-05 NOTE — Telephone Encounter (Signed)
Can pt stop Eliquis for root canal?   Please advise.

## 2017-12-08 NOTE — Telephone Encounter (Signed)
OK to interrupt Eliquis for root canal

## 2017-12-09 ENCOUNTER — Encounter: Payer: Self-pay | Admitting: Cardiovascular Disease

## 2017-12-09 ENCOUNTER — Ambulatory Visit (INDEPENDENT_AMBULATORY_CARE_PROVIDER_SITE_OTHER): Payer: PPO | Admitting: Cardiovascular Disease

## 2017-12-09 DIAGNOSIS — R0789 Other chest pain: Secondary | ICD-10-CM

## 2017-12-09 NOTE — Progress Notes (Signed)
Dakota Lewis returns for follow-up of his noninvasive test previous Myoview was nonischemic and low risk and his 2-D echo was essentially normal. I believe his pain is nonischemic. I will see him back in 6 months for follow-up.  Lorretta Harp, M.D., [place], [facility], [place] [address] [place], [place]  [address]  [phone] 12/09/2017 1:56 PM

## 2017-12-09 NOTE — Assessment & Plan Note (Signed)
Dakota Lewis returns for follow-up of his noninvasive test previous Myoview was nonischemic and low risk and his 2-D echo was essentially normal. I believe his pain is nonischemic. I will see him back in 6 months for follow-up.

## 2017-12-09 NOTE — Patient Instructions (Signed)

## 2017-12-10 ENCOUNTER — Ambulatory Visit: Payer: PPO | Admitting: Cardiovascular Disease

## 2017-12-11 NOTE — Telephone Encounter (Signed)
Spoke to pt who stated this was addressed at ov on 3/5.

## 2017-12-29 ENCOUNTER — Telehealth: Payer: Self-pay | Admitting: Adult Health

## 2017-12-29 DIAGNOSIS — L821 Other seborrheic keratosis: Secondary | ICD-10-CM | POA: Diagnosis not present

## 2017-12-29 DIAGNOSIS — D0439 Carcinoma in situ of skin of other parts of face: Secondary | ICD-10-CM | POA: Diagnosis not present

## 2017-12-29 DIAGNOSIS — L820 Inflamed seborrheic keratosis: Secondary | ICD-10-CM | POA: Diagnosis not present

## 2017-12-29 DIAGNOSIS — L309 Dermatitis, unspecified: Secondary | ICD-10-CM | POA: Diagnosis not present

## 2017-12-29 DIAGNOSIS — L570 Actinic keratosis: Secondary | ICD-10-CM | POA: Diagnosis not present

## 2017-12-29 DIAGNOSIS — D485 Neoplasm of uncertain behavior of skin: Secondary | ICD-10-CM | POA: Diagnosis not present

## 2017-12-29 NOTE — Telephone Encounter (Signed)
Pt is in the office requesting OneTouch Ultra2 meter and lancets   Pharm:  [facility].

## 2017-12-30 DIAGNOSIS — E119 Type 2 diabetes mellitus without complications: Secondary | ICD-10-CM | POA: Insufficient documentation

## 2017-12-30 DIAGNOSIS — E1165 Type 2 diabetes mellitus with hyperglycemia: Secondary | ICD-10-CM | POA: Insufficient documentation

## 2017-12-30 MED ORDER — GLUCOSE BLOOD VI STRP: ORAL_STRIP | 12 refills | Status: DC

## 2017-12-30 MED ORDER — ONETOUCH ULTRASOFT LANCETS MISC: 12 refills | Status: DC

## 2017-12-30 MED ORDER — ONETOUCH ULTRA 2 W/DEVICE KIT: 1.0000 | PACK | Freq: Once | 0 refills | Status: AC

## 2017-12-30 NOTE — Telephone Encounter (Signed)
Sent to the pharmacy by e-scribe. 

## 2017-12-30 NOTE — Telephone Encounter (Signed)
Dakota Lewis, did you order?  I do not see dx of hyperglycemia or DM.  Please advise.

## 2017-12-30 NOTE — Telephone Encounter (Signed)
Ok to order. He has a new diagnosis of diabetes.

## 2018-01-14 ENCOUNTER — Encounter: Payer: Self-pay | Admitting: Adult Health

## 2018-01-14 ENCOUNTER — Ambulatory Visit (INDEPENDENT_AMBULATORY_CARE_PROVIDER_SITE_OTHER): Payer: PPO | Admitting: Adult Health

## 2018-01-14 VITALS — BP 130/66 | Temp 98.4°F | Wt 185.0 lb

## 2018-01-14 DIAGNOSIS — Z760 Encounter for issue of repeat prescription: Secondary | ICD-10-CM

## 2018-01-14 DIAGNOSIS — E119 Type 2 diabetes mellitus without complications: Secondary | ICD-10-CM | POA: Diagnosis not present

## 2018-01-14 LAB — POCT GLYCOSYLATED HEMOGLOBIN (HGB A1C): Hemoglobin A1C: 6.1

## 2018-01-14 NOTE — Patient Instructions (Signed)
It was great seeing you today! You have accomplished a lot in three months. Keep up the good work

## 2018-01-14 NOTE — Progress Notes (Signed)
Subjective:    Patient ID: Dakota Lewis, male    DOB: [birth year], 83 y.o.   MRN: [id number]  HPI 84 year old male who  has a past medical history of Bilateral pulmonary embolism ([place]), Chronic cough, DM (diabetes mellitus) ([place]), DVT (deep venous thrombosis) ([place]), Gastrointestinal bleed, H/O cardiovascular stress test (05/07/2011), H/O Doppler ultrasound (2013), H/O Doppler ultrasound (2012), H/O echocardiogram (03/14/2011), H/O exercise stress test (2006), Hiatal hernia, Hypertension, and Prostate cancer ([place]).   He presents to the office today for follow up regarding diabeties mellitus. During his visit in January 2019 he was found to have an A1c of 7.8. He was asked to monitor his blood sugar at home and work on life style modifications prior to medication therapy.   Today in the office he reports that he has been working on diet and has been eating less carbs and less sugars. He has been able to lose 15 pounds since he last saw me.   He also needs refills his diabetic supplies.   Overall, he feels as though he is doing " 100% better".   Review of Systems See HPI  Past Medical History:  Diagnosis Date  . Bilateral pulmonary embolism (HCC)    lovenox & coumadin thearpy  . Chronic cough   . DM (diabetes mellitus) ([place])   . DVT (deep venous thrombosis) (H[facility])    left lower  . Gastrointestinal bleed    prior  . H/O cardiovascular stress test 05/07/2011   normal study, low risk scan  . H/O Doppler ultrasound 2013   venous duplex doppler  . H/O Doppler ultrasound 2012   venous duplex doppler  . H/O echocardiogram 03/14/2011   EF 65-70%  . H/O exercise stress test 2006   neg bruce protocol excercise stress test  . Hiatal hernia   . Hypertension   . Prostate cancer [facility])    s/p radiation    Social History   Socioeconomic History  . Marital status: Married    Spouse name: Not on file  . Number of children: Not on file  . Years of education: Not on file  . Highest education  level: Not on file  Occupational History  . Not on file  Social Needs  . Financial resource strain: Not on file  . Food insecurity:    Worry: Not on file    Inability: Not on file  . Transportation needs:    Medical: Not on file    Non-medical: Not on file  Tobacco Use  . Smoking status: Former Smoker    Packs/day: 0.75    Years: 20.00    Pack years: 15.00  . Smokeless tobacco: Never Used  Substance and Sexual Activity  . Alcohol use: No  . Drug use: No  . Sexual activity: Not on file  Lifestyle  . Physical activity:    Days per week: Not on file    Minutes per session: Not on file  . Stress: Not on file  Relationships  . Social connections:    Talks on phone: Not on file    Gets together: Not on file    Attends religious service: Not on file    Active member of club or organization: Not on file    Attends meetings of clubs or organizations: Not on file    Relationship status: Not on file  . Intimate partner violence:    Fear of current or ex partner: Not on file    Emotionally abused: Not on  file    Physically abused: Not on file    Forced sexual activity: Not on file  Other Topics Concern  . Not on file  Social History Narrative  . Not on file    Past Surgical History:  Procedure Laterality Date  . event monitor     2012  . HERNIA REPAIR Bilateral    1991, 1992  . HIATAL HERNIA REPAIR     1997  . IVC FILTER INSERTION    . PROSTATE SURGERY      Family History  Problem Relation Age of Onset  . Stroke Mother   . Kidney disease Father   . Heart disease Brother   . Arthritis Brother   . Heart disease Brother   . Prostate cancer Brother     Allergies  Allergen Reactions  . Lidocaine     ANTI ITCH CREAM, NO SPECIFICATIONS IN RECORDS.  Marland Kitchen Lisinopril     cough    Current Outpatient Medications on File Prior to Visit  Medication Sig Dispense Refill  . acetaminophen-codeine (TYLENOL #3) 300-30 MG tablet Take 1 tablet by mouth every 4 (four) hours as  needed for moderate pain. 30 tablet 0  . amLODipine (NORVASC) 10 MG tablet Take 1 tablet (10 mg total) by mouth daily. 90 tablet 3  . apixaban (ELIQUIS) 5 MG TABS tablet Take 5 mg by mouth 2 (two) times daily.    . Ascorbic Acid (VITAMIN C) 1000 MG tablet Take 1,000 mg by mouth daily.    . Calcium Carb-Cholecalciferol (CALCIUM 600+D) 600-800 MG-UNIT TABS Take 1 tablet by mouth daily.    . Cholecalciferol (D3-1000 PO) Take 1 capsule by mouth daily.    Marland Kitchen glucose blood test strip USE TO TEST BLOOD GLUCOSE TWICE DAILY 100 each 12  . hydrochlorothiazide (HYDRODIURIL) 25 MG tablet Take 1 tablet (25 mg total) by mouth daily. 90 tablet 3  . Lancets (ONETOUCH ULTRASOFT) lancets USE TO TEST BLOOD GLUCOSE TWICE DAILY 100 each 12  . losartan (COZAAR) 100 MG tablet Take 1 tablet (100 mg total) by mouth daily. 90 tablet 2  . Multiple Vitamins-Minerals (PRESERVISION AREDS 2 PO) Take 1 tablet by mouth daily.    . Omega-3 Fatty Acids (FISH OIL PO) Take 1 capsule by mouth 2 (two) times daily.    Marland Kitchen omeprazole (PRILOSEC) 20 MG capsule Take 20 mg by mouth daily.    Marland Kitchen oxybutynin (DITROPAN) 5 MG tablet Take 5 mg by mouth 3 (three) times daily.    . potassium chloride (K-DUR,KLOR-CON) 10 MEQ tablet Take 1 tablet (10 mEq total) by mouth daily. 90 tablet 2   No current facility-administered medications on file prior to visit.     BP 130/66   Temp 98.4 F (36.9 C) (Oral)   Wt 185 lb (83.9 kg)   BMI 28.98 kg/m       Objective:   Physical Exam  Constitutional: He is oriented to person, place, and time. He appears well-developed and well-nourished. No distress.  Cardiovascular: Normal rate, regular rhythm, normal heart sounds and intact distal pulses. Exam reveals no gallop.  No murmur heard. Pulmonary/Chest: Effort normal and breath sounds normal. No respiratory distress. He has no wheezes. He has no rales. He exhibits no tenderness.  Musculoskeletal: Normal range of motion. He exhibits no edema, tenderness or  deformity.  Neurological: He is alert and oriented to person, place, and time. He has normal reflexes. He displays normal reflexes. No cranial nerve deficit. He exhibits normal muscle tone. Coordination normal.  Skin: Skin is warm and dry. No rash noted. He is not diaphoretic. No erythema. No pallor.  Psychiatric: He has a normal mood and affect. His behavior is normal. Judgment and thought content normal.  Nursing note and vitals reviewed.     Assessment & Plan:  1. Diabetes mellitus without complication (HCC) - POCT A1C- 6.1. Has improved. Continue with current diet.  - He will follow up in July for CPE  - Follow up sooner   2. Medication refill - Diabetic supplies to be sent   [name], NP

## 2018-01-15 ENCOUNTER — Telehealth: Payer: Self-pay | Admitting: Adult Health

## 2018-01-15 MED ORDER — ONETOUCH ULTRA 2 W/DEVICE KIT: PACK | 0 refills | Status: DC

## 2018-01-15 MED ORDER — GLUCOSE BLOOD VI STRP: ORAL_STRIP | 3 refills | Status: DC

## 2018-01-15 MED ORDER — ONETOUCH ULTRASOFT LANCETS MISC: 3 refills | Status: DC

## 2018-01-15 NOTE — Telephone Encounter (Signed)
That is fine 

## 2018-01-15 NOTE — Telephone Encounter (Signed)
Dakota Lewis notified to pick placard at the front desk.

## 2018-01-15 NOTE — Telephone Encounter (Signed)
Copied from [place] [id number]. Topic: Quick Communication - See Telephone Encounter >> Jan 15, 2018 11:26 AM Robina Ade, Helene Kelp D wrote: CRM for notification. See Telephone encounter for: 01/15/18. Ms. [name] patients daughter called to talk to [facility] about patients visit and a meter he needs. Please call patient daughter back, thanks.

## 2018-01-15 NOTE — Telephone Encounter (Signed)
Orders sent to [facility].  Dakota Lewis would also like a handicap placard.  Ok to fill out?

## 2018-01-16 ENCOUNTER — Telehealth: Payer: Self-pay | Admitting: Family Medicine

## 2018-01-16 MED ORDER — GLUCOSE BLOOD VI STRP: ORAL_STRIP | 3 refills | Status: DC

## 2018-01-16 NOTE — Telephone Encounter (Signed)
Re sent to the pharmacy with appropriate directions.

## 2018-01-16 NOTE — Telephone Encounter (Signed)
Copied from [place] [phone]. Topic: General - Other >> Jan 16, 2018  8:48 AM Yvette Rack wrote: Reason for CRM:  Missy from [facility] calling wanting to know who many times a day to pt test his blood for glucose blood test strip because e it need to say on order for insurance to pay for it please call  [facility] - [place], [place] (Phone) [phone] (Fax)

## 2018-01-20 ENCOUNTER — Ambulatory Visit: Payer: PPO | Admitting: Adult Health

## 2018-02-25 ENCOUNTER — Encounter: Payer: Self-pay | Admitting: Internal Medicine

## 2018-02-25 ENCOUNTER — Ambulatory Visit (INDEPENDENT_AMBULATORY_CARE_PROVIDER_SITE_OTHER): Payer: PPO | Admitting: Internal Medicine

## 2018-02-25 VITALS — BP 138/62 | HR 62 | Temp 97.7°F | Wt 182.3 lb

## 2018-02-25 DIAGNOSIS — K7581 Nonalcoholic steatohepatitis (NASH): Secondary | ICD-10-CM | POA: Diagnosis not present

## 2018-02-25 DIAGNOSIS — H6120 Impacted cerumen, unspecified ear: Secondary | ICD-10-CM

## 2018-02-25 DIAGNOSIS — H9193 Unspecified hearing loss, bilateral: Secondary | ICD-10-CM

## 2018-02-25 DIAGNOSIS — Z974 Presence of external hearing-aid: Secondary | ICD-10-CM

## 2018-02-25 NOTE — Progress Notes (Signed)
Chief Complaint  Patient presents with  . Cerumen Impaction    Left worse than right side. Pt has tried home treatment but no success.     HPI: Dakota Lewis 84 y.o.   SDA  PcP appt NA  Here with daughter   [facility]    Uses drops but not getting out wax . No pian .   ROS: See pertinent positives and negatives per HPI.  Past Medical History:  Diagnosis Date  . Bilateral pulmonary embolism (HCC)    lovenox & coumadin thearpy  . Chronic cough   . DM (diabetes mellitus) ([place])   . DVT (deep venous thrombosis) (HCC)    left lower  . Gastrointestinal bleed    prior  . H/O cardiovascular stress test 05/07/2011   normal study, low risk scan  . H/O Doppler ultrasound 2013   venous duplex doppler  . H/O Doppler ultrasound 2012   venous duplex doppler  . H/O echocardiogram 03/14/2011   EF 65-70%  . H/O exercise stress test 2006   neg bruce protocol excercise stress test  . Hiatal hernia   . Hypertension   . Prostate cancer [facility])    s/p radiation    Family History  Problem Relation Age of Onset  . Stroke Mother   . Kidney disease Father   . Heart disease Brother   . Arthritis Brother   . Heart disease Brother   . Prostate cancer Brother     Social History   Socioeconomic History  . Marital status: Married    Spouse name: Not on file  . Number of children: Not on file  . Years of education: Not on file  . Highest education level: Not on file  Occupational History  . Not on file  Social Needs  . Financial resource strain: Not on file  . Food insecurity:    Worry: Not on file    Inability: Not on file  . Transportation needs:    Medical: Not on file    Non-medical: Not on file  Tobacco Use  . Smoking status: Former Smoker    Packs/day: 0.75    Years: 20.00    Pack years: 15.00  . Smokeless tobacco: Never Used  Substance and Sexual Activity  . Alcohol use: No  . Drug use: No  . Sexual activity: Not on file    Lifestyle  . Physical activity:    Days per week: Not on file    Minutes per session: Not on file  . Stress: Not on file  Relationships  . Social connections:    Talks on phone: Not on file    Gets together: Not on file    Attends religious service: Not on file    Active member of club or organization: Not on file    Attends meetings of clubs or organizations: Not on file    Relationship status: Not on file  Other Topics Concern  . Not on file  Social History Narrative  . Not on file    Outpatient Medications Prior to Visit  Medication Sig Dispense Refill  . amLODipine (NORVASC) 10 MG tablet Take 1 tablet (10 mg total) by mouth daily. 90 tablet 3  . apixaban (ELIQUIS) 5 MG TABS tablet Take 5 mg by mouth 2 (two) times daily.    . Ascorbic Acid (VITAMIN C) 1000 MG tablet Take 1,000 mg by mouth daily.    Marland Kitchen  Blood Glucose Monitoring Suppl (ONE TOUCH ULTRA 2) w/Device KIT USE TO TEST BLOOD GLUCOSE TWICE DAILY 1 each 0  . Calcium Carb-Cholecalciferol (CALCIUM 600+D) 600-800 MG-UNIT TABS Take 1 tablet by mouth daily.    . Cholecalciferol (D3-1000 PO) Take 1 capsule by mouth daily.    Marland Kitchen glucose blood test strip USE TO TEST BLOOD GLUCOSE TWICE DAILY 200 each 3  . hydrochlorothiazide (HYDRODIURIL) 25 MG tablet Take 1 tablet (25 mg total) by mouth daily. 90 tablet 3  . Lancets (ONETOUCH ULTRASOFT) lancets USE TO TEST BLOOD GLUCOSE TWICE DAILY 200 each 3  . losartan (COZAAR) 100 MG tablet Take 1 tablet (100 mg total) by mouth daily. 90 tablet 2  . Multiple Vitamins-Minerals (PRESERVISION AREDS 2 PO) Take 1 tablet by mouth daily.    . Omega-3 Fatty Acids (FISH OIL PO) Take 1 capsule by mouth 2 (two) times daily.    Marland Kitchen omeprazole (PRILOSEC) 20 MG capsule Take 20 mg by mouth daily.    Marland Kitchen oxybutynin (DITROPAN) 5 MG tablet Take 5 mg by mouth 3 (three) times daily.    . potassium chloride (K-DUR,KLOR-CON) 10 MEQ tablet Take 1 tablet (10 mEq total) by mouth daily. 90 tablet 2  . acetaminophen-codeine  (TYLENOL #3) 300-30 MG tablet Take 1 tablet by mouth every 4 (four) hours as needed for moderate pain. (Patient not taking: Reported on 02/25/2018) 30 tablet 0   No facility-administered medications prior to visit.      EXAM:  BP 138/62 (BP Location: Left Arm, Patient Position: Sitting, Cuff Size: Normal)   Pulse 62   Temp 97.7 F (36.5 C) (Oral)   Wt 182 lb 4.8 oz (82.7 kg)   BMI 28.55 kg/m   Body mass index is 28.55 kg/m.  GENERAL: vitals reviewed and listed above, alert, oriented, appears well hydrated and in no acute distress HEENT: atraumatic, conjunctiva  clear, no obvious abnormalities on inspection of external nose and ears   After irrigation right ear clear of plug of wax  Left  No acute lesion  Hearing still dec after  Has hearing aids  PSYCH: pleasant and cooperative, no obvious depression or anxiety  ASSESSMENT AND PLAN:  Discussed the following assessment and plan:  Bilateral hearing loss, unspecified hearing loss type  Hearing aid worn  Wax in ear Advise fu with  His audiologist and consider reg  check for this  -Patient advised to return or notify health care team  if symptoms worsen ,persist or new concerns arise.  There are no Patient Instructions on file for this visit.   Standley Brooking. Daci Stubbe M.D.

## 2018-02-26 ENCOUNTER — Telehealth: Payer: Self-pay | Admitting: Adult Health

## 2018-02-26 NOTE — Telephone Encounter (Signed)
It is ok to take both

## 2018-02-26 NOTE — Telephone Encounter (Signed)
Copied from [place] [id number]. Topic: Quick Communication - See Telephone Encounter >> Feb 26, 2018 10:34 AM Ether Griffins B wrote: CRM for notification. See Telephone encounter for: 02/26/18.  Pt's daughter calling in to speak with [name]'s nurse. He went to the dentist yesterday and they want to put him on a steroid and he is on eliquis. Can he take both at the same time?  CB# [phone]

## 2018-02-26 NOTE — Telephone Encounter (Signed)
[name] notified Tommi Rumps has gave authorization to take both medications.

## 2018-02-26 NOTE — Telephone Encounter (Signed)
Spoke to [place].  See below message.  Did tell Dakota Lewis that steroid may increase his blood sugars as he has ran high in the past.  Will check with Tommi Rumps to see if he can take steroid and Eliquis.

## 2018-02-27 ENCOUNTER — Other Ambulatory Visit: Payer: Self-pay | Admitting: Nurse Practitioner

## 2018-02-27 DIAGNOSIS — K7581 Nonalcoholic steatohepatitis (NASH): Secondary | ICD-10-CM

## 2018-03-05 DIAGNOSIS — D0430 Carcinoma in situ of skin of unspecified part of face: Secondary | ICD-10-CM | POA: Diagnosis not present

## 2018-03-05 DIAGNOSIS — L570 Actinic keratosis: Secondary | ICD-10-CM | POA: Diagnosis not present

## 2018-03-09 ENCOUNTER — Ambulatory Visit
Admission: RE | Admit: 2018-03-09 | Discharge: 2018-03-09 | Disposition: A | Discharge status: Home / Self Care | Payer: PPO | Source: Ambulatory Visit | Attending: Nurse Practitioner | Admitting: Nurse Practitioner

## 2018-03-09 DIAGNOSIS — K7581 Nonalcoholic steatohepatitis (NASH): Secondary | ICD-10-CM

## 2018-03-17 ENCOUNTER — Ambulatory Visit
Admission: RE | Admit: 2018-03-17 | Discharge: 2018-03-17 | Disposition: A | Discharge status: Home / Self Care | Payer: PPO | Source: Ambulatory Visit | Attending: Nurse Practitioner | Admitting: Nurse Practitioner

## 2018-03-17 DIAGNOSIS — K7689 Other specified diseases of liver: Secondary | ICD-10-CM | POA: Diagnosis not present

## 2018-04-23 ENCOUNTER — Other Ambulatory Visit: Payer: Self-pay | Admitting: Adult Health

## 2018-04-23 ENCOUNTER — Encounter: Payer: Self-pay | Admitting: Adult Health

## 2018-04-23 ENCOUNTER — Ambulatory Visit (INDEPENDENT_AMBULATORY_CARE_PROVIDER_SITE_OTHER): Payer: PPO | Admitting: Adult Health

## 2018-04-23 VITALS — BP 132/60 | Temp 97.8°F | Ht 67.0 in | Wt 181.0 lb

## 2018-04-23 DIAGNOSIS — I10 Essential (primary) hypertension: Secondary | ICD-10-CM

## 2018-04-23 DIAGNOSIS — N3281 Overactive bladder: Secondary | ICD-10-CM | POA: Diagnosis not present

## 2018-04-23 DIAGNOSIS — M779 Enthesopathy, unspecified: Secondary | ICD-10-CM

## 2018-04-23 DIAGNOSIS — Z0000 Encounter for general adult medical examination without abnormal findings: Secondary | ICD-10-CM

## 2018-04-23 DIAGNOSIS — E1165 Type 2 diabetes mellitus with hyperglycemia: Secondary | ICD-10-CM | POA: Diagnosis not present

## 2018-04-23 LAB — BASIC METABOLIC PANEL
BUN: 28 mg/dL — ABNORMAL HIGH (ref 6–23)
CO2: 29 mEq/L (ref 19–32)
Calcium: 10.7 mg/dL — ABNORMAL HIGH (ref 8.4–10.5)
Chloride: 103 mEq/L (ref 96–112)
Creatinine, Ser: 1.11 mg/dL (ref 0.40–1.50)
GFR: 67.18 mL/min (ref >60.00)
Glucose, Bld: 134 mg/dL — ABNORMAL HIGH (ref 70–99)
Potassium: 5 mEq/L (ref 3.5–5.1)
Sodium: 138 mEq/L (ref 135–145)

## 2018-04-23 LAB — HEMOGLOBIN A1C: Hgb A1c MFr Bld: 6.1 % (ref 4.6–6.5)

## 2018-04-23 LAB — CBC WITH DIFFERENTIAL/PLATELET
Basophils Absolute: 0 10*3/uL (ref 0.0–0.1)
Basophils Relative: 0.6 % (ref 0.0–3.0)
Eosinophils Absolute: 0.2 10*3/uL (ref 0.0–0.7)
Eosinophils Relative: 3.5 % (ref 0.0–5.0)
HCT: 41 % (ref 39.0–52.0)
Hemoglobin: 13.8 g/dL (ref 13.0–17.0)
Lymphocytes Relative: 32 % (ref 12.0–46.0)
Lymphs Abs: 2 10*3/uL (ref 0.7–4.0)
MCHC: 33.6 g/dL (ref 30.0–36.0)
MCV: 91 fl (ref 78.0–100.0)
Monocytes Absolute: 0.5 10*3/uL (ref 0.1–1.0)
Monocytes Relative: 8.3 % (ref 3.0–12.0)
Neutro Abs: 3.4 10*3/uL (ref 1.4–7.7)
Neutrophils Relative %: 55.6 % (ref 43.0–77.0)
Platelets: 177 10*3/uL (ref 150.0–400.0)
RBC: 4.51 Mil/uL (ref 4.22–5.81)
RDW: 13.4 % (ref 11.5–15.5)
WBC: 6.1 10*3/uL (ref 4.0–10.5)

## 2018-04-23 LAB — LIPID PANEL
Cholesterol: 168 mg/dL (ref 0–200)
HDL: 54 mg/dL (ref >39.00)
LDL Cholesterol: 86 mg/dL (ref 0–99)
NonHDL: 113.78
Total CHOL/HDL Ratio: 3
Triglycerides: 138 mg/dL (ref 0.0–149.0)
VLDL: 27.6 mg/dL (ref 0.0–40.0)

## 2018-04-23 LAB — HEPATIC FUNCTION PANEL
ALT: 33 U/L (ref 0–53)
AST: 24 U/L (ref 0–37)
Albumin: 4.3 g/dL (ref 3.5–5.2)
Alkaline Phosphatase: 76 U/L (ref 39–117)
Bilirubin, Direct: 0.1 mg/dL (ref 0.0–0.3)
Total Bilirubin: 0.4 mg/dL (ref 0.2–1.2)
Total Protein: 7.3 g/dL (ref 6.0–8.3)

## 2018-04-23 NOTE — Patient Instructions (Signed)
It was great seeing you today   You look great!   I will follow up with Dakota Lewis regarding your blood work   It seems as though that arm pain you are experiencing is tendonitis. Stretching exercises will help with this.   Please follow up with me in one year for your next physical

## 2018-04-23 NOTE — Progress Notes (Signed)
Subjective:    Patient ID: Dakota Lewis, male    DOB: [birth year], 84 y.o.   MRN: [id number]  HPI  Patient presents for yearly preventative medicine examination. He is a very pleasant 84 year old male who  has a past medical history of Bilateral pulmonary embolism ([place]), Chronic cough, DM (diabetes mellitus) ([place]), DVT (deep venous thrombosis) ([place]), Gastrointestinal bleed, H/O cardiovascular stress test (05/07/2011), H/O Doppler ultrasound (2013), H/O Doppler ultrasound (2012), H/O echocardiogram (03/14/2011), H/O exercise stress test (2006), Hiatal hernia, Hypertension, and Prostate cancer ([place]).  History of bilateral pulmonary embolism -currently prescribed Eliquis 5 mg twice daily. He has an IVC filter in place   Hypertension - Controlled with hydrochlorothiazide 25 mg daily, Norvasc 10 mg daily, and Cozaar 100 mg daily  BP Readings from Last 3 Encounters:  04/23/18 132/60  02/25/18 138/62  01/14/18 130/66   DM -controlled with diet.  Lab Results  Component Value Date   HGBA1C 6.1 01/14/2018   H/O Prostate CA -status post radiation in 2009.  He is followed by urology yearly.   OAB - Controlled with Ditropan in the past, he reports that he stopped taking this as his daughter who is a pharmacist was concerned that it would cause his potassium level to increase  GERD - Controlled with prilosec 20 mg daily.   All immunizations and health maintenance protocols were reviewed with the patient and needed orders were placed.  Appropriate screening laboratory values were ordered for the patient including screening of hyperlipidemia, renal function and hepatic function.  Medication reconciliation,  past medical history, social history, problem list and allergies were reviewed in detail with the patient  Goals were established with regard to weight loss, exercise, and  diet in compliance with medications.  Continues to stay active and eats a heart healthy diet.  Wt Readings from Last 3  Encounters:  04/23/18 181 lb (82.1 kg)  02/25/18 182 lb 4.8 oz (82.7 kg)  01/14/18 185 lb (83.9 kg)   End of life planning was discussed. He has an advanced directive and living will   He has appointments for routine dental and vision screens.   His only acute complaint is that of right upper arm pain x 24 hours. Reports that his right upper arm feels sore. Denies any trauma to the area. Has not been using anything over the counter. Pain does not interfere with ROM or ADL's   Review of Systems  Constitutional: Negative.   HENT: Positive for hearing loss.   Eyes: Negative.   Respiratory: Negative.   Cardiovascular: Negative.   Gastrointestinal: Negative.   Endocrine: Negative.   Genitourinary:       Nocturia    Musculoskeletal: Positive for myalgias.  Skin: Negative.   Allergic/Immunologic: Negative.   Neurological: Negative.   Hematological: Negative.   Psychiatric/Behavioral: Negative.   All other systems reviewed and are negative.  Past Medical History:  Diagnosis Date  . Bilateral pulmonary embolism ([facility])    lovenox & coumadin thearpy  . Chronic cough   . DM (diabetes mellitus) ([place])   . DVT (deep venous thrombosis) ([facility])    left lower  . Gastrointestinal bleed    prior  . H/O cardiovascular stress test 05/07/2011   normal study, low risk scan  . H/O Doppler ultrasound 2013   venous duplex doppler  . H/O Doppler ultrasound 2012   venous duplex doppler  . H/O echocardiogram 03/14/2011   EF 65-70%  . H/O exercise stress test 2006  neg bruce protocol excercise stress test  . Hiatal hernia   . Hypertension   . Prostate cancer [facility])    s/p radiation    Social History   Socioeconomic History  . Marital status: Married    Spouse name: Not on file  . Number of children: Not on file  . Years of education: Not on file  . Highest education level: Not on file  Occupational History  . Not on file  Social Needs  . Financial resource strain: Not on file  . Food  insecurity:    Worry: Not on file    Inability: Not on file  . Transportation needs:    Medical: Not on file    Non-medical: Not on file  Tobacco Use  . Smoking status: Former Smoker    Packs/day: 0.75    Years: 20.00    Pack years: 15.00  . Smokeless tobacco: Never Used  Substance and Sexual Activity  . Alcohol use: No  . Drug use: No  . Sexual activity: Not on file  Lifestyle  . Physical activity:    Days per week: Not on file    Minutes per session: Not on file  . Stress: Not on file  Relationships  . Social connections:    Talks on phone: Not on file    Gets together: Not on file    Attends religious service: Not on file    Active member of club or organization: Not on file    Attends meetings of clubs or organizations: Not on file    Relationship status: Not on file  . Intimate partner violence:    Fear of current or ex partner: Not on file    Emotionally abused: Not on file    Physically abused: Not on file    Forced sexual activity: Not on file  Other Topics Concern  . Not on file  Social History Narrative  . Not on file    Past Surgical History:  Procedure Laterality Date  . event monitor     2012  . HERNIA REPAIR Bilateral    1991, 1992  . HIATAL HERNIA REPAIR     1997  . IVC FILTER INSERTION    . PROSTATE SURGERY      Family History  Problem Relation Age of Onset  . Stroke Mother   . Kidney disease Father   . Heart disease Brother   . Arthritis Brother   . Heart disease Brother   . Prostate cancer Brother     Allergies  Allergen Reactions  . Lidocaine     ANTI ITCH CREAM, NO SPECIFICATIONS IN RECORDS.  Marland Kitchen Lisinopril     cough    Current Outpatient Medications on File Prior to Visit  Medication Sig Dispense Refill  . amLODipine (NORVASC) 10 MG tablet Take 1 tablet (10 mg total) by mouth daily. 90 tablet 3  . apixaban (ELIQUIS) 5 MG TABS tablet Take 5 mg by mouth 2 (two) times daily.    . Ascorbic Acid (VITAMIN C) 1000 MG tablet Take  1,000 mg by mouth daily.    . Blood Glucose Monitoring Suppl (ONE TOUCH ULTRA 2) w/Device KIT USE TO TEST BLOOD GLUCOSE TWICE DAILY 1 each 0  . Calcium Carb-Cholecalciferol (CALCIUM 600+D) 600-800 MG-UNIT TABS Take 1 tablet by mouth daily.    . Cholecalciferol (D3-1000 PO) Take 1 capsule by mouth daily.    Marland Kitchen glucose blood test strip USE TO TEST BLOOD GLUCOSE TWICE DAILY 200 each 3  .  hydrochlorothiazide (HYDRODIURIL) 25 MG tablet Take 1 tablet (25 mg total) by mouth daily. 90 tablet 3  . Lancets (ONETOUCH ULTRASOFT) lancets USE TO TEST BLOOD GLUCOSE TWICE DAILY 200 each 3  . losartan (COZAAR) 100 MG tablet Take 1 tablet (100 mg total) by mouth daily. 90 tablet 2  . Omega-3 Fatty Acids (FISH OIL PO) Take 1 capsule by mouth 2 (two) times daily.    Marland Kitchen omeprazole (PRILOSEC) 20 MG capsule Take 20 mg by mouth daily.    . potassium chloride (K-DUR,KLOR-CON) 10 MEQ tablet Take 1 tablet (10 mEq total) by mouth daily. 90 tablet 2   No current facility-administered medications on file prior to visit.     BP 132/60   Temp 97.8 F (36.6 C) (Oral)   Ht _0  (1.702 m)   Wt 181 lb (82.1 kg)   BMI 28.35 kg/m       Objective:   Physical Exam  Constitutional: He is oriented to person, place, and time. He appears well-developed and well-nourished. No distress.  HENT:  Head: Normocephalic and atraumatic.  Right Ear: External ear normal.  Left Ear: External ear normal.  Nose: Nose normal.  Mouth/Throat: Oropharynx is clear and moist. No oropharyngeal exudate.  Eyes: Pupils are equal, round, and reactive to light. Conjunctivae and EOM are normal. Right eye exhibits no discharge. Left eye exhibits no discharge. No scleral icterus.  Neck: No JVD present. No tracheal deviation present. No thyromegaly present.  Cardiovascular: Normal rate, regular rhythm, normal heart sounds and intact distal pulses. Exam reveals no gallop and no friction rub.  No murmur heard. Pulmonary/Chest: Effort normal and breath  sounds normal. No stridor. No respiratory distress. He has no wheezes. He has no rales. He exhibits no tenderness.  Abdominal: Soft. Bowel sounds are normal. He exhibits no distension and no mass. There is no tenderness. There is no rebound and no guarding. No hernia.  Musculoskeletal: Normal range of motion. He exhibits tenderness (tenderness along lateral aspect of left ). He exhibits no edema or deformity.  Lymphadenopathy:    He has no cervical adenopathy.  Neurological: He is alert and oriented to person, place, and time. He displays normal reflexes. No cranial nerve deficit or sensory deficit. He exhibits normal muscle tone. Coordination normal.  Skin: Skin is warm and dry. Capillary refill takes less than 2 seconds. No rash noted. He is not diaphoretic. No erythema. No pallor.  Psychiatric: He has a normal mood and affect. His behavior is normal. Judgment and thought content normal.  Nursing note and vitals reviewed.     Assessment & Plan:  1. Routine general medical examination at a health care facility - Appears well.  - Follow up in one year or sooner if needed - Continue to stay active and exercise  - Basic metabolic panel - CBC with Differential/Platelet - Hemoglobin A1c - Hepatic function panel - Lipid panel  2. Uncontrolled type 2 diabetes mellitus with hyperglycemia ([place]) - Consider medication  - Basic metabolic panel - CBC with Differential/Platelet - Hemoglobin A1c - Hepatic function panel - Lipid panel  3. Essential hypertension - well controlled.  -- no change in medications  - Basic metabolic panel - CBC with Differential/Platelet - Hemoglobin A1c - Hepatic function panel - Lipid panel  4. Overactive bladder - Ok to talk with Urology about other medications regarding OAB  5. Tendonitis - Advised stretching exercises    [name], NP

## 2018-05-25 ENCOUNTER — Ambulatory Visit (INDEPENDENT_AMBULATORY_CARE_PROVIDER_SITE_OTHER): Payer: PPO | Admitting: Family Medicine

## 2018-05-25 ENCOUNTER — Encounter: Payer: Self-pay | Admitting: Family Medicine

## 2018-05-25 ENCOUNTER — Other Ambulatory Visit (INDEPENDENT_AMBULATORY_CARE_PROVIDER_SITE_OTHER): Payer: PPO

## 2018-05-25 VITALS — BP 160/68 | HR 58 | Temp 97.6°F | Wt 184.6 lb

## 2018-05-25 DIAGNOSIS — E119 Type 2 diabetes mellitus without complications: Secondary | ICD-10-CM | POA: Diagnosis not present

## 2018-05-25 DIAGNOSIS — E1165 Type 2 diabetes mellitus with hyperglycemia: Secondary | ICD-10-CM

## 2018-05-25 DIAGNOSIS — H6122 Impacted cerumen, left ear: Secondary | ICD-10-CM

## 2018-05-25 LAB — GLUCOSE, POCT (MANUAL RESULT ENTRY): POC Glucose: 126 mg/dl — AB (ref 70–99)

## 2018-05-25 LAB — VITAMIN D 25 HYDROXY (VIT D DEFICIENCY, FRACTURES): VITD: 58.53 ng/mL (ref 30.00–100.00)

## 2018-05-25 NOTE — Progress Notes (Signed)
Dakota Lewis DOB: May 13, 1933 Encounter date: 05/25/2018  This is a 84 y.o. male who presents with Chief Complaint  Patient presents with  . ear cleaning    getting fit for hearing aids    History of present illness:  Getting fit for new hearing aids tomorrow; was told from [place] that he needed to get his ears cleaned out.   Had difficulty with getting refill on lancets. They are on the way now but it has taken phone call with mail order and a couple of weeks to get straightened out. Concerned with not being able to check sugars.   Has not taken blood pressure medication this morning.   Is heading to lab after ear cleaning for hypercalcemia workup.     Allergies  Allergen Reactions  . Lidocaine     ANTI ITCH CREAM, NO SPECIFICATIONS IN RECORDS.  Marland Kitchen Lisinopril     cough   Current Meds  Medication Sig  . amLODipine (NORVASC) 10 MG tablet Take 1 tablet (10 mg total) by mouth daily.  Marland Kitchen apixaban (ELIQUIS) 5 MG TABS tablet Take 5 mg by mouth 2 (two) times daily.  . Ascorbic Acid (VITAMIN C) 1000 MG tablet Take 1,000 mg by mouth daily.  . Blood Glucose Monitoring Suppl (ONE TOUCH ULTRA 2) w/Device KIT USE TO TEST BLOOD GLUCOSE TWICE DAILY  . Calcium Carb-Cholecalciferol (CALCIUM 600+D) 600-800 MG-UNIT TABS Take 1 tablet by mouth daily.  . Cholecalciferol (D3-1000 PO) Take 1 capsule by mouth daily.  Marland Kitchen glucose blood test strip USE TO TEST BLOOD GLUCOSE TWICE DAILY  . hydrochlorothiazide (HYDRODIURIL) 25 MG tablet Take 1 tablet (25 mg total) by mouth daily.  . Lancets (ONETOUCH ULTRASOFT) lancets USE TO TEST BLOOD GLUCOSE TWICE DAILY  . losartan (COZAAR) 100 MG tablet Take 1 tablet (100 mg total) by mouth daily.  . Omega-3 Fatty Acids (FISH OIL PO) Take 1 capsule by mouth 2 (two) times daily.  Marland Kitchen omeprazole (PRILOSEC) 20 MG capsule Take 20 mg by mouth daily.  . potassium chloride (K-DUR,KLOR-CON) 10 MEQ tablet Take 1 tablet (10 mEq total) by mouth daily.    Review of Systems   Constitutional: Negative for chills and fever.  HENT: Positive for hearing loss. Negative for congestion and ear pain.     Objective:  BP (!) 160/68   Pulse (!) 58   Temp 97.6 F (36.4 C) (Oral)   Wt 184 lb 9.6 oz (83.7 kg)   SpO2 96%   BMI 28.91 kg/m   Weight: 184 lb 9.6 oz (83.7 kg)   BP Readings from Last 3 Encounters:  05/25/18 (!) 160/68  04/23/18 132/60  02/25/18 138/62   Wt Readings from Last 3 Encounters:  05/25/18 184 lb 9.6 oz (83.7 kg)  04/23/18 181 lb (82.1 kg)  02/25/18 182 lb 4.8 oz (82.7 kg)    Physical Exam  Constitutional: He appears well-developed and well-nourished. No distress.  HENT:  Right Ear: Ear canal normal. No drainage, swelling or tenderness. Tympanic membrane is not injected, not scarred, not perforated, not erythematous and not retracted. No middle ear effusion. Decreased hearing is noted.  Left Ear: Ear canal normal. No drainage, swelling or tenderness. Tympanic membrane is not injected, not scarred, not perforated, not erythematous and not retracted.  No middle ear effusion. Decreased hearing is noted.  Hearing decreased bilaterally.   Left ear with significant cerumen impaction. Using irrigation, microscope, currette and alligator clamp, impaction was fully removed.   Cerumen mild in right ear; resolved with  irrigation and currette.   Underlying canal TM normal after cerumen removed.  Pulmonary/Chest: Effort normal.    Assessment/Plan 1. Hearing loss due to cerumen impaction, left Impaction resolved in office with immediate improvement in hearing; having fitting for hearing aids tomorrow.  2. Diet controlled diabetes Sugar wnl in office. Will call mail order to check on status of lancets in meanwhile.  - POCT glucose (manual entry) Return if symptoms worsen or fail to improve.       Micheline Rough, MD

## 2018-05-27 LAB — PTH, INTACT AND CALCIUM
Calcium: 10.7 mg/dL — ABNORMAL HIGH (ref 8.6–10.3)
PTH: 13 pg/mL — ABNORMAL LOW (ref 14–64)

## 2018-06-30 ENCOUNTER — Telehealth: Payer: Self-pay | Admitting: *Deleted

## 2018-06-30 NOTE — Telephone Encounter (Signed)
CBD can interact with Eliquis and make his blood thinner than what it already is. I cannot advise to use CBD orally. He should be able to use a lotion

## 2018-06-30 NOTE — Telephone Encounter (Signed)
Daughter is calling - patient would like to try CBD oil for his arthritis   He was told to ask before use because of apixaban (ELIQUIS) 5 MG TABS tablet.  Okay to use?

## 2018-06-30 NOTE — Telephone Encounter (Signed)
Left detailed message on machine for daughter.

## 2018-07-06 DIAGNOSIS — C61 Malignant neoplasm of prostate: Secondary | ICD-10-CM | POA: Diagnosis not present

## 2018-07-06 DIAGNOSIS — N5201 Erectile dysfunction due to arterial insufficiency: Secondary | ICD-10-CM | POA: Diagnosis not present

## 2018-07-10 ENCOUNTER — Other Ambulatory Visit: Payer: Self-pay | Admitting: Adult Health

## 2018-07-10 MED ORDER — LOSARTAN POTASSIUM 100 MG PO TABS: 100.0000 mg | ORAL_TABLET | Freq: Every day | ORAL | 2 refills | Status: DC

## 2018-07-10 NOTE — Telephone Encounter (Signed)
Copied from [place] [id number]. Topic: Quick Communication - Rx Refill/Question >> Jul 10, 2018  1:19 PM Carolyn Stare wrote: Medication   losartan (COZAAR) 100 MG tablet      pt had his cpe in July 2019   Has the patient contacted their pharmacy yes    Preferred Pharmacy [facility]   Agent: Please be advised that RX refills may take up to 3 business days. We ask that you follow-up with your pharmacy.

## 2018-08-26 DIAGNOSIS — K7581 Nonalcoholic steatohepatitis (NASH): Secondary | ICD-10-CM | POA: Diagnosis not present

## 2018-08-27 ENCOUNTER — Other Ambulatory Visit: Payer: Self-pay | Admitting: Nurse Practitioner

## 2018-08-27 DIAGNOSIS — K7581 Nonalcoholic steatohepatitis (NASH): Secondary | ICD-10-CM

## 2018-09-08 ENCOUNTER — Ambulatory Visit
Admission: RE | Admit: 2018-09-08 | Discharge: 2018-09-08 | Disposition: A | Discharge status: Home / Self Care | Payer: PPO | Source: Ambulatory Visit | Attending: Nurse Practitioner | Admitting: Nurse Practitioner

## 2018-09-08 DIAGNOSIS — K7581 Nonalcoholic steatohepatitis (NASH): Secondary | ICD-10-CM

## 2018-09-08 DIAGNOSIS — K760 Fatty (change of) liver, not elsewhere classified: Secondary | ICD-10-CM | POA: Diagnosis not present

## 2018-10-22 ENCOUNTER — Other Ambulatory Visit: Payer: Self-pay | Admitting: Adult Health

## 2018-10-22 DIAGNOSIS — Z760 Encounter for issue of repeat prescription: Secondary | ICD-10-CM

## 2018-10-22 NOTE — Telephone Encounter (Signed)
Sent to the pharmacy by e-scribe. 

## 2018-12-09 ENCOUNTER — Other Ambulatory Visit: Payer: Self-pay | Admitting: Family Medicine

## 2018-12-09 MED ORDER — ONETOUCH ULTRA BLUE VI STRP: ORAL_STRIP | 3 refills | Status: DC

## 2018-12-09 MED ORDER — ONETOUCH DELICA LANCETS 30G MISC: 2.0000 | Freq: Every day | 3 refills | Status: DC

## 2019-03-03 ENCOUNTER — Telehealth: Payer: Self-pay

## 2019-03-03 NOTE — Telephone Encounter (Signed)
Copied from [place] [id number]. Topic: General - Inquiry >> Mar 03, 2019 12:12 PM Virl Axe D wrote: Reason for CRM: Pt's daughter [name] is requesting a rx for a recliner lift chair for pt. If they have a rx, pt can receive a discount of $265. Please advise.

## 2019-03-03 NOTE — Telephone Encounter (Signed)
Lets try dx: gait instability

## 2019-03-04 ENCOUNTER — Encounter: Payer: Self-pay | Admitting: Family Medicine

## 2019-03-04 NOTE — Telephone Encounter (Signed)
Left a message for a return call.  Need to find out if Dakota Lewis wants to pick up in office or would like for it to be mailed.

## 2019-03-04 NOTE — Telephone Encounter (Signed)
Request sent to below listed address.  Nothing further needed.

## 2019-03-04 NOTE — Telephone Encounter (Signed)
Pt's daughter [name] said it is okay to mail and would like it sent to her home  [address] [place], [place] [address]

## 2019-03-30 ENCOUNTER — Other Ambulatory Visit: Payer: Self-pay | Admitting: Adult Health

## 2019-03-30 DIAGNOSIS — Z760 Encounter for issue of repeat prescription: Secondary | ICD-10-CM

## 2019-03-31 DIAGNOSIS — K7581 Nonalcoholic steatohepatitis (NASH): Secondary | ICD-10-CM | POA: Diagnosis not present

## 2019-03-31 NOTE — Telephone Encounter (Signed)
Left a message for a return call.  Will need to schedule cpx with [name] (daughter/dpr on file)

## 2019-04-02 NOTE — Telephone Encounter (Signed)
Spoke to [place].  Pt recently had lab work done at specialist.  Will see if A1C was included.  [name] having lab results faxed to back fax.

## 2019-04-06 NOTE — Telephone Encounter (Signed)
Sent to the pharmacy by e-scribe for 90 days.  Pt has upcoming cpx. On 04/27/2019.

## 2019-04-08 ENCOUNTER — Other Ambulatory Visit: Payer: Self-pay | Admitting: Nurse Practitioner

## 2019-04-08 DIAGNOSIS — K7469 Other cirrhosis of liver: Secondary | ICD-10-CM

## 2019-04-21 ENCOUNTER — Ambulatory Visit
Admission: RE | Admit: 2019-04-21 | Discharge: 2019-04-21 | Disposition: A | Discharge status: Home / Self Care | Payer: PPO | Source: Ambulatory Visit | Attending: Nurse Practitioner | Admitting: Nurse Practitioner

## 2019-04-21 DIAGNOSIS — Z8719 Personal history of other diseases of the digestive system: Secondary | ICD-10-CM | POA: Diagnosis not present

## 2019-04-21 DIAGNOSIS — K760 Fatty (change of) liver, not elsewhere classified: Secondary | ICD-10-CM | POA: Diagnosis not present

## 2019-04-21 DIAGNOSIS — K7469 Other cirrhosis of liver: Secondary | ICD-10-CM

## 2019-04-27 ENCOUNTER — Encounter: Payer: PPO | Admitting: Adult Health

## 2019-05-05 ENCOUNTER — Other Ambulatory Visit: Payer: Self-pay | Admitting: Adult Health

## 2019-05-06 NOTE — Telephone Encounter (Signed)
Sent to the pharmacy by e-scribe for 90 days.

## 2019-06-21 DIAGNOSIS — R3913 Splitting of urinary stream: Secondary | ICD-10-CM | POA: Diagnosis not present

## 2019-06-21 DIAGNOSIS — N5201 Erectile dysfunction due to arterial insufficiency: Secondary | ICD-10-CM | POA: Diagnosis not present

## 2019-06-21 DIAGNOSIS — C61 Malignant neoplasm of prostate: Secondary | ICD-10-CM | POA: Diagnosis not present

## 2019-06-21 DIAGNOSIS — N481 Balanitis: Secondary | ICD-10-CM | POA: Diagnosis not present

## 2019-07-07 ENCOUNTER — Encounter: Payer: Self-pay | Admitting: Adult Health

## 2019-07-07 ENCOUNTER — Other Ambulatory Visit: Payer: Self-pay

## 2019-07-07 ENCOUNTER — Ambulatory Visit (INDEPENDENT_AMBULATORY_CARE_PROVIDER_SITE_OTHER): Payer: PPO | Admitting: Adult Health

## 2019-07-07 VITALS — BP 176/80 | Temp 97.6°F | Ht 67.0 in | Wt 193.0 lb

## 2019-07-07 DIAGNOSIS — E119 Type 2 diabetes mellitus without complications: Secondary | ICD-10-CM

## 2019-07-07 DIAGNOSIS — K7460 Unspecified cirrhosis of liver: Secondary | ICD-10-CM | POA: Diagnosis not present

## 2019-07-07 DIAGNOSIS — Z760 Encounter for issue of repeat prescription: Secondary | ICD-10-CM | POA: Diagnosis not present

## 2019-07-07 DIAGNOSIS — I10 Essential (primary) hypertension: Secondary | ICD-10-CM | POA: Diagnosis not present

## 2019-07-07 DIAGNOSIS — Z23 Encounter for immunization: Secondary | ICD-10-CM

## 2019-07-07 DIAGNOSIS — H6123 Impacted cerumen, bilateral: Secondary | ICD-10-CM

## 2019-07-07 DIAGNOSIS — Z0001 Encounter for general adult medical examination with abnormal findings: Secondary | ICD-10-CM

## 2019-07-07 DIAGNOSIS — I2699 Other pulmonary embolism without acute cor pulmonale: Secondary | ICD-10-CM

## 2019-07-07 DIAGNOSIS — Z0000 Encounter for general adult medical examination without abnormal findings: Secondary | ICD-10-CM

## 2019-07-07 LAB — CBC WITH DIFFERENTIAL/PLATELET
Basophils Absolute: 0 10*3/uL (ref 0.0–0.1)
Basophils Relative: 0.7 % (ref 0.0–3.0)
Eosinophils Absolute: 0.2 10*3/uL (ref 0.0–0.7)
Eosinophils Relative: 2.5 % (ref 0.0–5.0)
HCT: 39.4 % (ref 39.0–52.0)
Hemoglobin: 13.3 g/dL (ref 13.0–17.0)
Lymphocytes Relative: 38.9 % (ref 12.0–46.0)
Lymphs Abs: 2.4 10*3/uL (ref 0.7–4.0)
MCHC: 33.7 g/dL (ref 30.0–36.0)
MCV: 91.1 fl (ref 78.0–100.0)
Monocytes Absolute: 0.5 10*3/uL (ref 0.1–1.0)
Monocytes Relative: 8.4 % (ref 3.0–12.0)
Neutro Abs: 3.1 10*3/uL (ref 1.4–7.7)
Neutrophils Relative %: 49.5 % (ref 43.0–77.0)
Platelets: 183 10*3/uL (ref 150.0–400.0)
RBC: 4.33 Mil/uL (ref 4.22–5.81)
RDW: 12.6 % (ref 11.5–15.5)
WBC: 6.2 10*3/uL (ref 4.0–10.5)

## 2019-07-07 LAB — LIPID PANEL
Cholesterol: 191 mg/dL (ref 0–200)
HDL: 55.2 mg/dL (ref >39.00)
NonHDL: 136.01
Total CHOL/HDL Ratio: 3
Triglycerides: 205 mg/dL — ABNORMAL HIGH (ref 0.0–149.0)
VLDL: 41 mg/dL — ABNORMAL HIGH (ref 0.0–40.0)

## 2019-07-07 LAB — COMPREHENSIVE METABOLIC PANEL
ALT: 25 U/L (ref 0–53)
AST: 18 U/L (ref 0–37)
Albumin: 4.4 g/dL (ref 3.5–5.2)
Alkaline Phosphatase: 76 U/L (ref 39–117)
BUN: 23 mg/dL (ref 6–23)
CO2: 29 mEq/L (ref 19–32)
Calcium: 10.2 mg/dL (ref 8.4–10.5)
Chloride: 100 mEq/L (ref 96–112)
Creatinine, Ser: 1.03 mg/dL (ref 0.40–1.50)
GFR: 68.71 mL/min (ref >60.00)
Glucose, Bld: 123 mg/dL — ABNORMAL HIGH (ref 70–99)
Potassium: 4.7 mEq/L (ref 3.5–5.1)
Sodium: 138 mEq/L (ref 135–145)
Total Bilirubin: 0.7 mg/dL (ref 0.2–1.2)
Total Protein: 7.2 g/dL (ref 6.0–8.3)

## 2019-07-07 LAB — LDL CHOLESTEROL, DIRECT: Direct LDL: 100 mg/dL

## 2019-07-07 LAB — HEMOGLOBIN A1C: Hgb A1c MFr Bld: 6.3 % (ref 4.6–6.5)

## 2019-07-07 LAB — TSH: TSH: 1.15 u[IU]/mL (ref 0.35–4.50)

## 2019-07-07 MED ORDER — POTASSIUM CHLORIDE ER 10 MEQ PO CPCR: 10.0000 meq | ORAL_CAPSULE | Freq: Every day | ORAL | 3 refills | Status: DC

## 2019-07-07 MED ORDER — HYDROCHLOROTHIAZIDE 25 MG PO TABS: 25.0000 mg | ORAL_TABLET | Freq: Every day | ORAL | 3 refills | Status: DC

## 2019-07-07 MED ORDER — AMLODIPINE BESYLATE 10 MG PO TABS: 10.0000 mg | ORAL_TABLET | Freq: Every day | ORAL | 3 refills | Status: DC

## 2019-07-07 NOTE — Progress Notes (Addendum)
Subjective:    Patient ID: Dakota Lewis, male    DOB: Mar 31, 1933, 85 y.o.   MRN: [id number]  HPI Patient presents for yearly preventative medicine examination. He is a pleasant 85 year old male who  has a past medical history of Bilateral pulmonary embolism ([place]), Chronic cough, DM (diabetes mellitus) ([place]), DVT (deep venous thrombosis) ([place]), Gastrointestinal bleed, H/O cardiovascular stress test (05/07/2011), H/O Doppler ultrasound (2013), H/O Doppler ultrasound (2012), H/O echocardiogram (03/14/2011), H/O exercise stress test (2006), Hiatal hernia, Hypertension, and Prostate cancer ([place]).   He continues to drive locally and enjoys working around his home.   H/o bilateral PE-currently prescribed Eliquis 5 mg twice daily.  He does have an IVC filter  Hypertension-trolled with hydrochlorothiazide 25 mg daily, Norvasc 10 mg daily, and Cozaar 100 mg daily.  He denies chest pain, shortness of breath, dizziness, lightheadedness, or syncopal episodes. He did not take his blood pressure medications this morning  BP Readings from Last 3 Encounters:  07/07/19 (!) 176/80  05/25/18 (!) 160/68  04/23/18 132/60   DM - Diet controlled. Reports BS readings between 120-160 Lab Results  Component Value Date   HGBA1C 6.1 04/23/2018   H/O Prostate Cancer -status post radiation therapy in 2009.  He is followed by urology yearly  GERD - Controlled with prilosec 20 mg daily.   NASH - is followed by GI in [place]. Reports " nothing has changed"   All immunizations and health maintenance protocols were reviewed with the patient and needed orders were placed. He is due for yearly flu vaccination   Appropriate screening laboratory values were ordered for the patient including screening of hyperlipidemia, renal function and hepatic function.  Medication reconciliation,  past medical history, social history, problem list and allergies were reviewed in detail with the patient  Goals were established with  regard to weight loss, exercise, and  diet in compliance with medications  End of life planning was discussed. He has an advanced directive and living will.   He has no acute complaints today   Review of Systems  Constitutional: Negative.   HENT: Negative.   Eyes: Negative.   Respiratory: Negative.   Cardiovascular: Negative.   Gastrointestinal: Negative.   Endocrine: Negative.   Genitourinary: Negative.   Musculoskeletal: Positive for arthralgias and back pain.  Skin: Negative.   Allergic/Immunologic: Negative.   Neurological: Negative.   Hematological: Negative.   Psychiatric/Behavioral: Negative.   All other systems reviewed and are negative.  Past Medical History:  Diagnosis Date  . Bilateral pulmonary embolism (HCC)    lovenox & coumadin thearpy  . Chronic cough   . DM (diabetes mellitus) ([place])   . DVT (deep venous thrombosis) (H[facility])    left lower  . Gastrointestinal bleed    prior  . H/O cardiovascular stress test 05/07/2011   normal study, low risk scan  . H/O Doppler ultrasound 2013   venous duplex doppler  . H/O Doppler ultrasound 2012   venous duplex doppler  . H/O echocardiogram 03/14/2011   EF 65-70%  . H/O exercise stress test 2006   neg bruce protocol excercise stress test  . Hiatal hernia   . Hypertension   . Prostate cancer [facility])    s/p radiation    Social History   Socioeconomic History  . Marital status: Married    Spouse name: Not on file  . Number of children: Not on file  . Years of education: Not on file  . Highest education level: Not on file  Occupational History  . Not on file  Social Needs  . Financial resource strain: Not on file  . Food insecurity    Worry: Not on file    Inability: Not on file  . Transportation needs    Medical: Not on file    Non-medical: Not on file  Tobacco Use  . Smoking status: Former Smoker    Packs/day: 0.75    Years: 20.00    Pack years: 15.00  . Smokeless tobacco: Never Used  Substance and  Sexual Activity  . Alcohol use: No  . Drug use: No  . Sexual activity: Not on file  Lifestyle  . Physical activity    Days per week: Not on file    Minutes per session: Not on file  . Stress: Not on file  Relationships  . Social Herbalist on phone: Not on file    Gets together: Not on file    Attends religious service: Not on file    Active member of club or organization: Not on file    Attends meetings of clubs or organizations: Not on file    Relationship status: Not on file  . Intimate partner violence    Fear of current or ex partner: Not on file    Emotionally abused: Not on file    Physically abused: Not on file    Forced sexual activity: Not on file  Other Topics Concern  . Not on file  Social History Narrative  . Not on file    Past Surgical History:  Procedure Laterality Date  . event monitor     2012  . HERNIA REPAIR Bilateral    1991, 1992  . HIATAL HERNIA REPAIR     1997  . IVC FILTER INSERTION    . PROSTATE SURGERY      Family History  Problem Relation Age of Onset  . Stroke Mother   . Kidney disease Father   . Heart disease Brother   . Arthritis Brother   . Heart disease Brother   . Prostate cancer Brother     Allergies  Allergen Reactions  . Lidocaine     ANTI ITCH CREAM, NO SPECIFICATIONS IN RECORDS.  Marland Kitchen Lisinopril     cough    Current Outpatient Medications on File Prior to Visit  Medication Sig Dispense Refill  . apixaban (ELIQUIS) 5 MG TABS tablet Take 5 mg by mouth 2 (two) times daily.    . Ascorbic Acid (VITAMIN C) 1000 MG tablet Take 1,000 mg by mouth daily.    . Blood Glucose Monitoring Suppl (ONE TOUCH ULTRA 2) w/Device KIT USE TO TEST BLOOD GLUCOSE TWICE DAILY 1 each 0  . Calcium Carb-Cholecalciferol (CALCIUM 600+D) 600-800 MG-UNIT TABS Take 1 tablet by mouth daily.    . Cholecalciferol (D3-1000 PO) Take 1 capsule by mouth daily.    Marland Kitchen glucose blood test strip USE TO TEST BLOOD GLUCOSE TWICE DAILY 200 each 3  .  losartan (COZAAR) 100 MG tablet TAKE ONE TABLET (100 mg total) BY MOUTH DAILY 90 tablet 0  . Omega-3 Fatty Acids (FISH OIL PO) Take 1 capsule by mouth 2 (two) times daily.    Marland Kitchen omeprazole (PRILOSEC) 20 MG capsule Take 20 mg by mouth daily.    . ONE TOUCH ULTRA TEST test strip USE TO TEST BLOOD GLUCOSE TWICE DAILY 200 each 3  . ONETOUCH DELICA LANCETS 67T MISC 2 strips by Does not apply route daily. 200 each 3  No current facility-administered medications on file prior to visit.     BP (!) 176/80   Temp 97.6 F (36.4 C) (Temporal)   Ht 5' 7"  (1.702 m)   Wt 193 lb (87.5 kg)   BMI 30.23 kg/m       Objective:   Physical Exam Vitals signs and nursing note reviewed.  Constitutional:      General: He is not in acute distress.    Appearance: Normal appearance. He is obese. He is not diaphoretic.  HENT:     Head: Normocephalic and atraumatic.     Right Ear: Tympanic membrane, ear canal and external ear normal. There is impacted cerumen.     Left Ear: Tympanic membrane, ear canal and external ear normal. There is impacted cerumen.     Nose: Nose normal. No congestion or rhinorrhea.     Mouth/Throat:     Mouth: Mucous membranes are moist.     Pharynx: Oropharynx is clear. No oropharyngeal exudate or posterior oropharyngeal erythema.  Eyes:     General: No scleral icterus.       Right eye: No discharge.        Left eye: No discharge.     Conjunctiva/sclera: Conjunctivae normal.     Pupils: Pupils are equal, round, and reactive to light.  Neck:     Musculoskeletal: Normal range of motion and neck supple.     Thyroid: No thyromegaly.     Vascular: No JVD.     Trachea: No tracheal deviation.  Cardiovascular:     Rate and Rhythm: Normal rate and regular rhythm.     Pulses: Normal pulses.     Heart sounds: Normal heart sounds. No murmur. No friction rub. No gallop.   Pulmonary:     Effort: Pulmonary effort is normal. No respiratory distress.     Breath sounds: Normal breath sounds.  No stridor. No wheezing, rhonchi or rales.  Chest:     Chest wall: No tenderness.  Abdominal:     General: Abdomen is flat. Bowel sounds are normal. There is no distension.     Palpations: Abdomen is soft. There is no mass.     Tenderness: There is no abdominal tenderness. There is no right CVA tenderness, left CVA tenderness, guarding or rebound.     Hernia: No hernia is present.  Musculoskeletal: Normal range of motion.        General: Tenderness (left shoulder) present. No swelling, deformity or signs of injury.     Right lower leg: No edema.     Left lower leg: No edema.  Lymphadenopathy:     Cervical: No cervical adenopathy.  Skin:    General: Skin is warm and dry.     Capillary Refill: Capillary refill takes less than 2 seconds.     Coloration: Skin is not jaundiced or pale.     Findings: No bruising, erythema, lesion or rash.  Neurological:     General: No focal deficit present.     Mental Status: He is alert and oriented to person, place, and time. Mental status is at baseline.     Cranial Nerves: No cranial nerve deficit.     Sensory: No sensory deficit.     Motor: No weakness or abnormal muscle tone.     Coordination: Coordination normal.     Gait: Gait normal.     Deep Tendon Reflexes: Reflexes are normal and symmetric. Reflexes normal.  Psychiatric:        Mood and Affect:  Mood normal.        Behavior: Behavior normal.        Thought Content: Thought content normal.        Judgment: Judgment normal.       Assessment & Plan:  1. Routine general medical examination at a health care facility - Continue to stay active and eat healthy  - CBC with Differential/Platelet - Comprehensive metabolic panel - Lipid panel - TSH - Hemoglobin A1c  2. Diet-controlled diabetes mellitus ([place]) - Consider metformin  - CBC with Differential/Platelet - Comprehensive metabolic panel - Lipid panel - TSH - Hemoglobin A1c  3. Essential hypertension - Not at goal today but he  did not take his medications this morning  - CBC with Differential/Platelet - Comprehensive metabolic panel - Lipid panel - TSH - Hemoglobin A1c - amLODipine (NORVASC) 10 MG tablet; Take 1 tablet (10 mg total) by mouth daily.  Dispense: 90 tablet; Refill: 3 - hydrochlorothiazide (HYDRODIURIL) 25 MG tablet; Take 1 tablet (25 mg total) by mouth daily.  Dispense: 90 tablet; Refill: 3 - potassium chloride (MICRO-K) 10 MEQ CR capsule; Take 1 capsule (10 mEq total) by mouth daily.  Dispense: 90 capsule; Refill: 3  4. Pulmonary embolism, unspecified chronicity, unspecified pulmonary embolism type, unspecified whether acute cor pulmonale present (HCC) - Continue Eliquis   5. Cirrhosis of liver without ascites, unspecified hepatic cirrhosis type ([place]) - Continue follow up with GI   6. Medication refill  - amLODipine (NORVASC) 10 MG tablet; Take 1 tablet (10 mg total) by mouth daily.  Dispense: 90 tablet; Refill: 3 - hydrochlorothiazide (HYDRODIURIL) 25 MG tablet; Take 1 tablet (25 mg total) by mouth daily.  Dispense: 90 tablet; Refill: 3 - potassium chloride (MICRO-K) 10 MEQ CR capsule; Take 1 capsule (10 mEq total) by mouth daily.  Dispense: 90 capsule; Refill: 3  7. Bilateral impacted cerumen Informed consent obtained. Cerumen impaction was performed bilaterally with instrumentation ( ear curette). There were no complications and following the disimpaction the tympanic membrane were visible on the bilateral. Tympanic membranes are intact following the procedure.  Auditory canals are normal.  The patient reported relief of symptoms after removal of cerumen.  [name], NP

## 2019-07-07 NOTE — Patient Instructions (Signed)
It was great seeing you today   We will follow up with you regarding your blood work   Please let me know if you need anything

## 2019-07-30 ENCOUNTER — Other Ambulatory Visit: Payer: Self-pay | Admitting: Adult Health

## 2019-08-03 NOTE — Telephone Encounter (Signed)
Sent to the pharmacy by e-scribe. 

## 2019-08-17 DIAGNOSIS — H353131 Nonexudative age-related macular degeneration, bilateral, early dry stage: Secondary | ICD-10-CM | POA: Diagnosis not present

## 2019-08-17 DIAGNOSIS — R519 Headache, unspecified: Secondary | ICD-10-CM | POA: Diagnosis not present

## 2019-08-17 LAB — HM DIABETES EYE EXAM

## 2019-09-07 ENCOUNTER — Telehealth: Payer: Self-pay

## 2019-09-07 NOTE — Telephone Encounter (Signed)
Spoke with pt's daughter, Dakota Lewis (DPR), and she states eye doctor was to fax over request for labs to be completed. I advised we typically do not draw outside labs. She states Tommi Rumps had already advised he would work with the eye doctor to try and help figure out where pt's pain is coming from. She would like to know if fax was received and if we can draw labs.

## 2019-09-07 NOTE — Telephone Encounter (Signed)
Copied from [place] [id number]. Topic: General - Other >> Sep 01, 2019  4:45 PM Yvette Rack wrote: Reason for CRM: Pt daughter [name] requests a call back from [place]. CB# [phone] >> Sep 07, 2019  3:01 PM Cox, Melburn Hake, CMA wrote: [facility] for [name] (DPR)  PEC needs to obtain more information in relation to call or request please.

## 2019-09-08 DIAGNOSIS — K7581 Nonalcoholic steatohepatitis (NASH): Secondary | ICD-10-CM | POA: Diagnosis not present

## 2019-09-08 NOTE — Telephone Encounter (Signed)
I am fine with that but I have not seen anything come through from his eye doctor

## 2019-09-09 NOTE — Telephone Encounter (Signed)
Spoke to [place] and advised that neither [place] or myself have received orders.  [name] will call and have them resent.

## 2019-09-14 ENCOUNTER — Other Ambulatory Visit: Payer: Self-pay | Admitting: Nurse Practitioner

## 2019-09-14 DIAGNOSIS — K7581 Nonalcoholic steatohepatitis (NASH): Secondary | ICD-10-CM

## 2019-09-14 NOTE — Telephone Encounter (Signed)
Spoke to [place] and advised that I have not received orders.  She will call to follow up.

## 2019-09-17 NOTE — Telephone Encounter (Signed)
Spoke to [place] and informed her that I have not received orders.  She will call one more time to have them faxed.  If not received by me then she will pick them up and bring here.

## 2019-09-21 IMAGING — CT CT ABD-PELV W/ CM
2 of 5 series · 14 of 46 positions shown, 16 images · IV contrast (ISOVUE 300)
Comparison: CT the abdomen and pelvis 03/07/2013.

CLINICAL DATA: 84-year-old male with history of left lower quadrant
abdominal tenderness for the past 3-4 weeks. Intermittent diarrhea.

EXAM:
CT ABDOMEN AND PELVIS WITH CONTRAST
TECHNIQUE: Multidetector CT imaging of the abdomen and pelvis was performed
using the standard protocol following bolus administration of
intravenous contrast.
CONTRAST:  100mL 1777CP-PBB IOPAMIDOL (1777CP-PBB) INJECTION 61%

[Series 2: abd/pel w · axial · 0.81mm/px · z∈[-447,-17]mm · 11 of 98 slices shown, 13 images]
[im 6/98  soft-tissue]
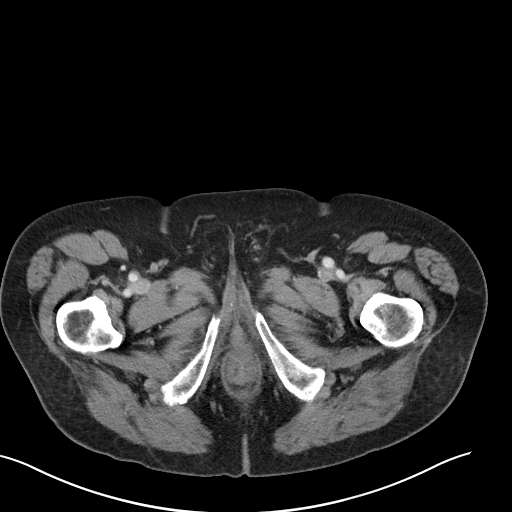
[im 6/98  bone]
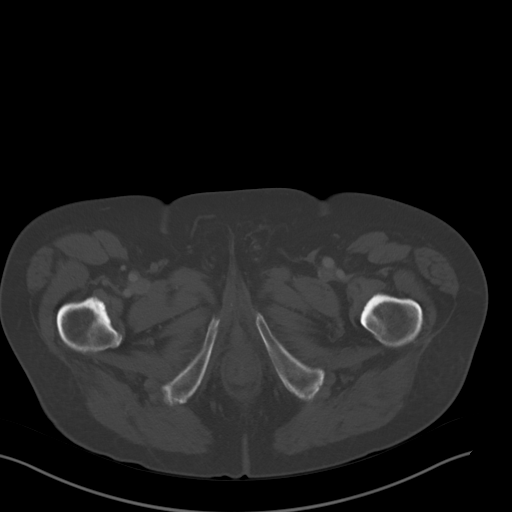
[im 17/98  soft-tissue]
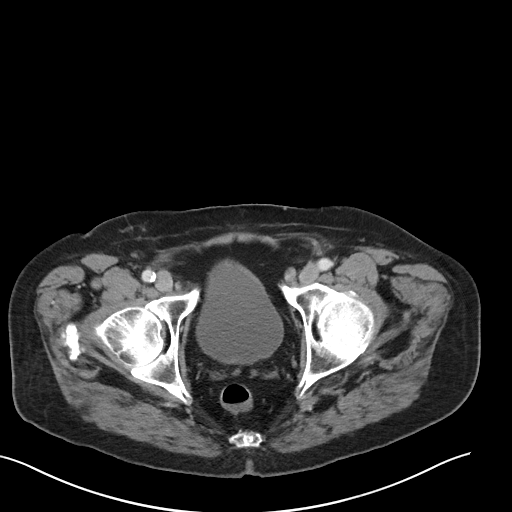
[im 22/98  soft-tissue]
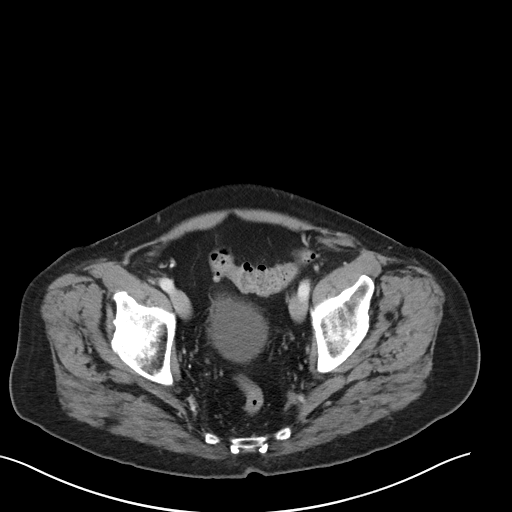
[im 33/98  soft-tissue]
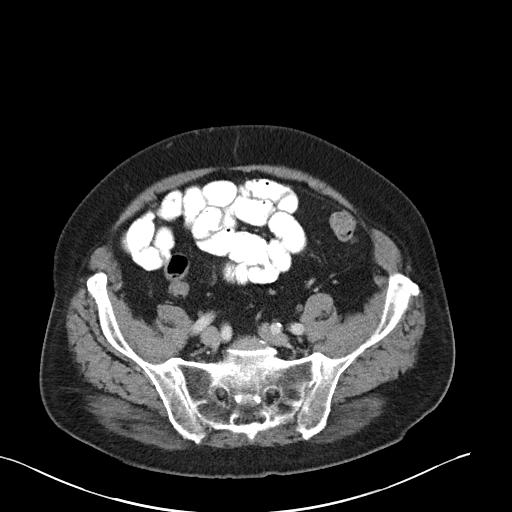
[im 38/98  soft-tissue]
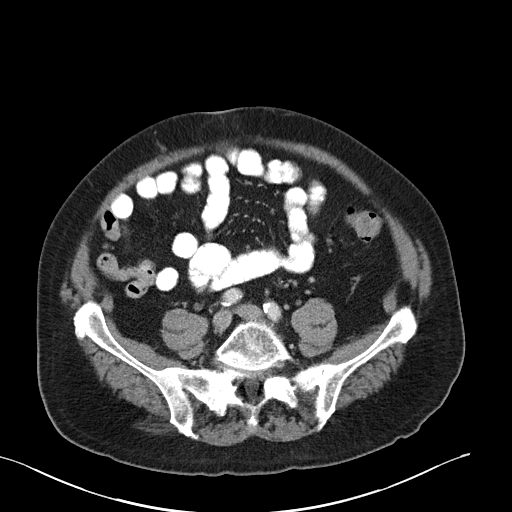
[im 49/98  soft-tissue]
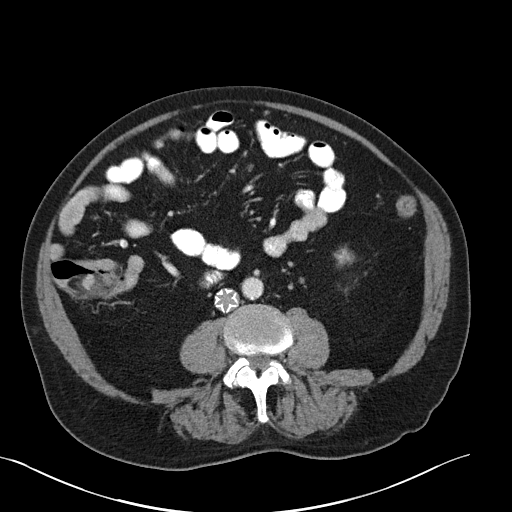
[im 60/98  soft-tissue]
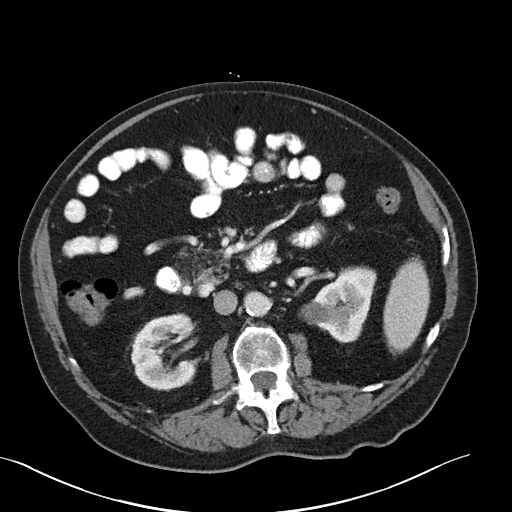
[im 65/98  soft-tissue]
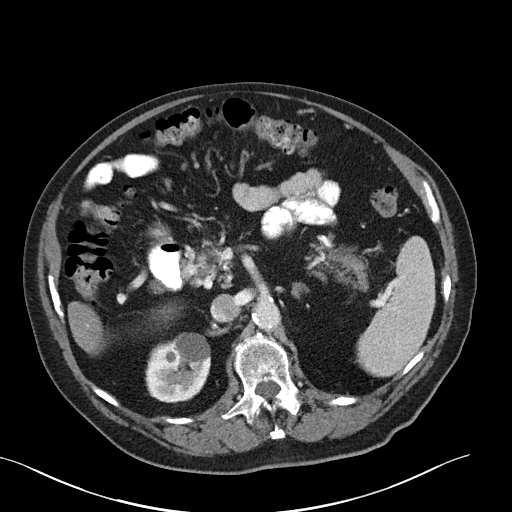
[im 76/98  soft-tissue]
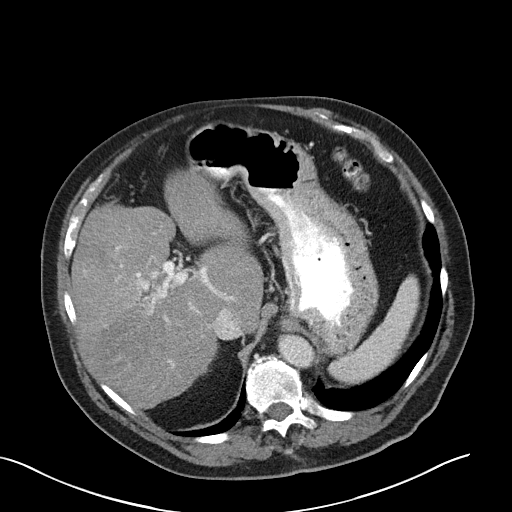
[im 76/98  bone]
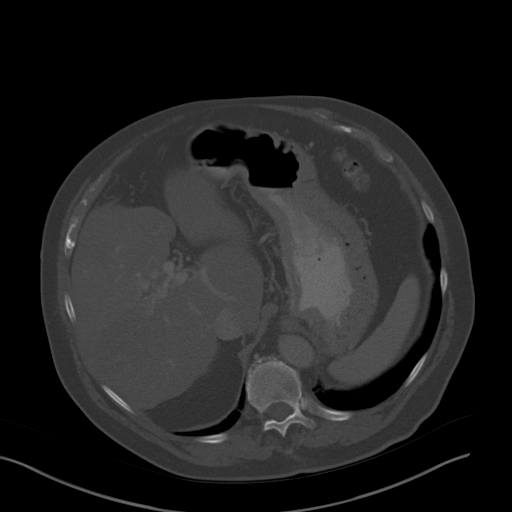
[im 81/98  soft-tissue]
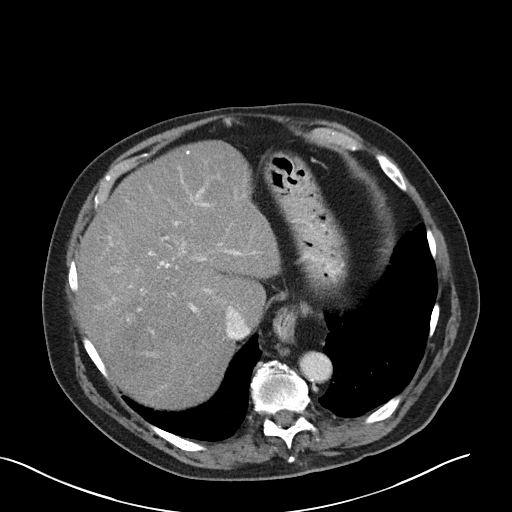
[im 92/98  soft-tissue]
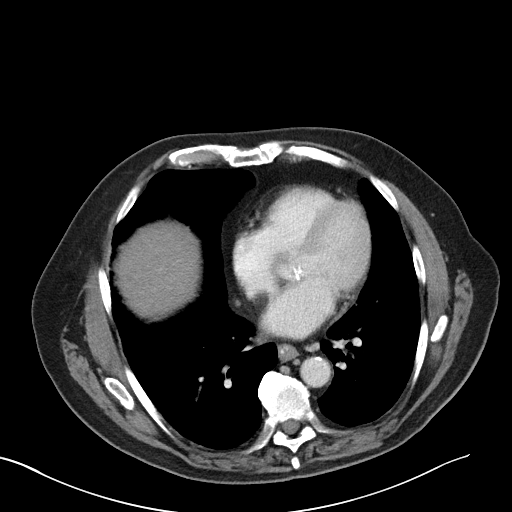

[Series 6: abd/pel w st · coronal · 0.75mm/px · 3 of 98 slices shown]
[im 33/98  soft-tissue]
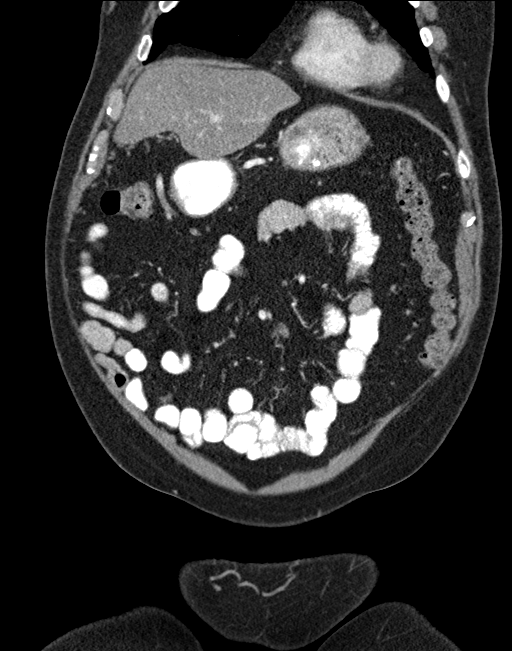
[im 44/98  soft-tissue]
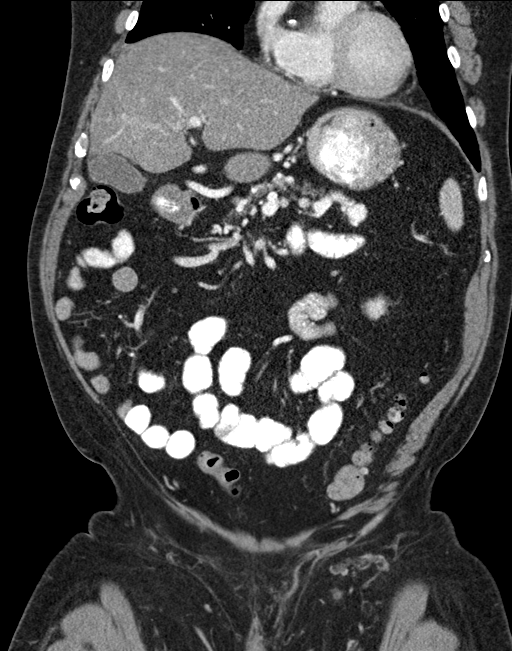
[im 54/98  soft-tissue]
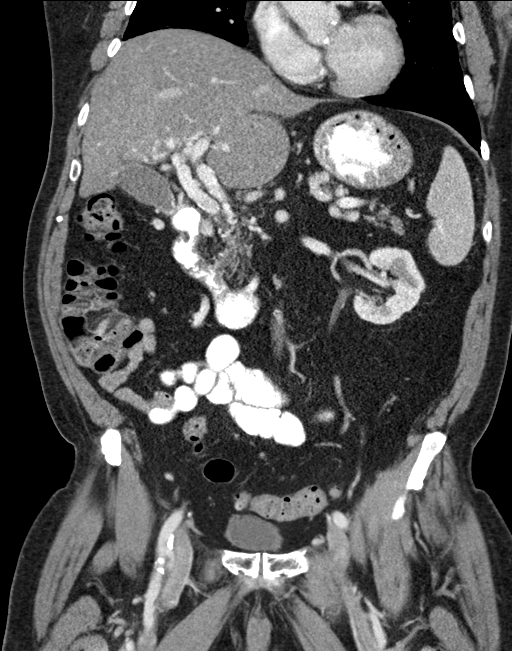

[14 of 46 positions shown; findings below may reference images not displayed]

FINDINGS: Lower chest: Atherosclerotic calcifications in the left anterior
descending, left circumflex and right coronary arteries.
Calcifications of the aortic valve.

Hepatobiliary: Diffuse low attenuation throughout the hepatic
parenchyma, similar to prior studies, compatible with a background
of hepatic steatosis. No suspicious cystic or solid hepatic lesions.
No intra or extrahepatic biliary ductal dilatation. Tiny calcified
granuloma in segment 2 of the liver incidentally noted. Gallbladder
is normal in appearance.

Pancreas: Diffuse fatty infiltration throughout the pancreas. No
pancreatic mass. No pancreatic ductal dilatation. No pancreatic or
peripancreatic fluid or inflammatory changes.

Spleen: Unremarkable.

Adrenals/Urinary Tract: Multiple low-attenuation lesions in both
kidneys, compatible with simple cysts, measuring up to 3.1 cm in the
upper pole the right kidney. No suspicious renal lesions. No
hydroureteronephrosis. Urinary bladder is normal in appearance.
Bilateral adrenal glands are normal in appearance.

Stomach/Bowel: Normal appearance of the stomach. No pathologic
dilatation of small bowel or colon. Numerous colonic diverticulae
are noted, without surrounding inflammatory changes to suggest an
acute diverticulitis at this time. The appendix is not confidently
identified and may be surgically absent. Regardless, there are no
inflammatory changes noted adjacent to the cecum to suggest the
presence of an acute appendicitis at this time.

Vascular/Lymphatic: Aortic atherosclerosis, without evidence of
aneurysm or dissection in the abdominal or pelvic vasculature. IVC
filter in position with tip at the level of the inferior endplate of
L2 below the level of the renal veins. Severe narrowing of the
distal splenic vein immediately before the level of the splenoportal
confluence. Superior mesenteric vein is also small caliber. Numerous
collateral varices are noted surrounding the pancreas, presumably
providing collateral flow for the splenic venous drainage. The
portal vein and main branches are widely patent. However, there is
cavernous transformation in the porta hepatis with what appears to
be essentially a dual system providing additional venous flow into
the hepatic parenchyma (similar to prior studies dating back to
5668). No lymphadenopathy noted in the abdomen or pelvis.

Reproductive: Fiducial markers in the prostate gland. Prostate gland
and seminal vesicles are otherwise unremarkable in appearance.

Other: No significant volume of ascites.  No pneumoperitoneum.

Musculoskeletal: There are no aggressive appearing lytic or blastic
lesions noted in the visualized portions of the skeleton.
IMPRESSION: 1. No acute findings are noted in the abdomen or pelvis to account
for the patient's symptoms.
2. There is extensive colonic diverticulosis, but no findings to
suggest an acute diverticulitis at this time.
3. Hepatic steatosis with morphologic changes in the liver
suggestive of underlying cirrhosis. Sequela of remote thrombotic
event with extensive venous collateralization providing drainage for
the spleen and cavernous transformation in the porta hepatis despite
a patent portal venous system, similar to prior studies.
4. Aortic atherosclerosis, in addition to 3 vessel coronary artery
disease.
5. There are calcifications of the aortic valve. Echocardiographic
correlation for evaluation of potential valvular dysfunction may be
warranted if clinically indicated.
6. Additional incidental findings, as above.

Aortic Atherosclerosis (G71UT-J0S.S).

## 2019-09-24 ENCOUNTER — Other Ambulatory Visit (INDEPENDENT_AMBULATORY_CARE_PROVIDER_SITE_OTHER): Payer: PPO

## 2019-09-24 ENCOUNTER — Other Ambulatory Visit: Payer: Self-pay | Admitting: Adult Health

## 2019-09-24 ENCOUNTER — Other Ambulatory Visit: Payer: Self-pay

## 2019-09-24 DIAGNOSIS — H409 Unspecified glaucoma: Secondary | ICD-10-CM

## 2019-09-24 LAB — CBC WITH DIFFERENTIAL/PLATELET
Basophils Absolute: 0 10*3/uL (ref 0.0–0.1)
Basophils Relative: 0.6 % (ref 0.0–3.0)
Eosinophils Absolute: 0.2 10*3/uL (ref 0.0–0.7)
Eosinophils Relative: 2.8 % (ref 0.0–5.0)
HCT: 39.9 % (ref 39.0–52.0)
Hemoglobin: 13.3 g/dL (ref 13.0–17.0)
Lymphocytes Relative: 33.7 % (ref 12.0–46.0)
Lymphs Abs: 2.2 10*3/uL (ref 0.7–4.0)
MCHC: 33.3 g/dL (ref 30.0–36.0)
MCV: 91.3 fl (ref 78.0–100.0)
Monocytes Absolute: 0.6 10*3/uL (ref 0.1–1.0)
Monocytes Relative: 9.3 % (ref 3.0–12.0)
Neutro Abs: 3.5 10*3/uL (ref 1.4–7.7)
Neutrophils Relative %: 53.6 % (ref 43.0–77.0)
Platelets: 191 10*3/uL (ref 150.0–400.0)
RBC: 4.37 Mil/uL (ref 4.22–5.81)
RDW: 13.1 % (ref 11.5–15.5)
WBC: 6.4 10*3/uL (ref 4.0–10.5)

## 2019-09-24 LAB — SEDIMENTATION RATE: Sed Rate: 11 mm/hr (ref 0–20)

## 2019-09-24 LAB — C-REACTIVE PROTEIN: CRP: <1 mg/dL (ref 0.5–20.0)

## 2019-10-05 ENCOUNTER — Telehealth (INDEPENDENT_AMBULATORY_CARE_PROVIDER_SITE_OTHER): Payer: PPO | Admitting: Adult Health

## 2019-10-05 ENCOUNTER — Other Ambulatory Visit: Payer: Self-pay

## 2019-10-05 DIAGNOSIS — R519 Headache, unspecified: Secondary | ICD-10-CM | POA: Diagnosis not present

## 2019-10-05 NOTE — Progress Notes (Signed)
Virtual Visit via Telephone Note  I connected with Dakota Lewis on 10/05/19 at  1:00 PM EST by telephone and verified that I am speaking with the correct person using two identifiers.   I discussed the limitations, risks, security and privacy concerns of performing an evaluation and management service by telephone and the availability of in person appointments. I also discussed with the patient that there may be a patient responsible charge related to this service. The patient expressed understanding and agreed to proceed.  Location patient: home Location provider: work or home office Participants present for the call: patient, provider, daughter Dakota Lewis) Patient did not have a visit in the prior 7 days to address this/these issue(s).   History of Present Illness: 85 year old male who is being evaluated today for right sided head/temple pain.  He reports that this been intermittent over the last 1 to 2 years.  About a month and a half ago pain became worse and he was getting 2-4 painful episodes a day.  Pain only lasted a minute or 2 and felt as though it was a real sharp pain, like somebody was jabbing me with an ice pack".  Pain has always been on the right side and usually around the temple but can radiate to the back of the ear.  Over the last 2 to 3 weeks he has had no episodes.  He denies blurred vision during these episodes but does endorse photophobia.  No phonophobia noted.  Head pain was not happening at the same time every day  He does have a history of migraines when he was younger but states "these are different".  At times the pain was so severe that he had to stop he was doing and sit down.  He was seen by his eye doctor, and had a sed rate, CRP, and CBC done.  All these were negative.  His eye exam not show any acute abnormalities-per family   Observations/Objective: Patient sounds cheerful and well on the phone. I do not appreciate any SOB. Speech and thought processing are  grossly intact. Patient reported vitals:  Assessment and Plan: 1. Acute nonintractable headache, unspecified headache type -Seems to be a new type of headache.  Possibly cluster headaches.  Due to age of onset will order MRI of the brain.  Since sed rate and CRP were negative doubt temporal arteritis. - MR Brain Wo Contrast; Future   Follow Up Instructions: I did not refer this patient for an OV in the next 24 hours for this/these issue(s).  I discussed the assessment and treatment plan with the patient. The patient was provided an opportunity to ask questions and all were answered. The patient agreed with the plan and demonstrated an understanding of the instructions.   The patient was advised to call back or seek an in-person evaluation if the symptoms worsen or if the condition fails to improve as anticipated.  I provided 18  minutes of non-face-to-face time during this encounter.   [name], NP

## 2019-10-06 ENCOUNTER — Ambulatory Visit
Admission: RE | Admit: 2019-10-06 | Discharge: 2019-10-06 | Disposition: A | Discharge status: Home / Self Care | Payer: PPO | Source: Ambulatory Visit | Attending: Nurse Practitioner | Admitting: Nurse Practitioner

## 2019-10-06 DIAGNOSIS — K7581 Nonalcoholic steatohepatitis (NASH): Secondary | ICD-10-CM

## 2019-10-06 DIAGNOSIS — K760 Fatty (change of) liver, not elsewhere classified: Secondary | ICD-10-CM | POA: Diagnosis not present

## 2019-10-15 DIAGNOSIS — H401121 Primary open-angle glaucoma, left eye, mild stage: Secondary | ICD-10-CM | POA: Diagnosis not present

## 2019-10-20 ENCOUNTER — Ambulatory Visit
Admission: RE | Admit: 2019-10-20 | Discharge: 2019-10-20 | Disposition: A | Discharge status: Home / Self Care | Payer: PPO | Source: Ambulatory Visit | Attending: Adult Health | Admitting: Adult Health

## 2019-10-20 ENCOUNTER — Other Ambulatory Visit: Payer: Self-pay

## 2019-10-20 DIAGNOSIS — R519 Headache, unspecified: Secondary | ICD-10-CM | POA: Diagnosis not present

## 2019-11-22 ENCOUNTER — Telehealth: Payer: Self-pay | Admitting: Adult Health

## 2019-11-22 NOTE — Chronic Care Management (AMB) (Signed)
  Chronic Care Management   Note  11/22/2019 Name: Dakota Lewis MRN: [id number] DOB: 1933-04-08  Dakota Lewis is a 86 y.o. year old male who is a primary care patient of [name], NP. I reached out to [name] by phone today in response to a referral sent by Mr. Rithwik Schmieg FOYDX'A PCP, [name], NP.   Mr. Squier was given information about Chronic Care Management services today including:  1. CCM service includes personalized support from designated clinical staff supervised by his physician, including individualized plan of care and coordination with other care providers 2. 24/7 contact phone numbers for assistance for urgent and routine care needs. 3. Service will only be billed when office clinical staff spend 20 minutes or more in a month to coordinate care. 4. Only one practitioner may furnish and bill the service in a calendar month. 5. The patient may stop CCM services at any time (effective at the end of the month) by phone call to the office staff. 6. The patient will be responsible for cost sharing (co-pay) of up to 20% of the service fee (after annual deductible is met).  Patient agreed to services and verbal consent obtained.   Follow up plan:   Raynicia Dukes [employer] Scheduler

## 2019-11-24 NOTE — Chronic Care Management (AMB) (Signed)
Chronic Care Management Pharmacy  Name: Dakota Lewis  MRN: [id number] DOB: 1933/03/26  Initial Questions: 1. Have you seen any other providers since your last visit? No  2. Any changes in your medicines or health? No   Chief Complaint/ HPI  Dakota Lewis,  86 y.o. , male presents for their Initial CCM visit with the clinical pharmacist via telephone.   PCP : [name], NP  Their chronic conditions include: HTN, history of PE, DM, Prostate Cancer, Cirrhosis, GERD, elevated triglycerides, Osteoarthritis  Office Visits: 10/04/2020- Patient presented via telemedicine to [name], NP for headaches. Patient had migraines when he was younger, but presented with different type of headaches (intermittent over last 1 to 2 years).  Due to age of onset, MRI was ordered.   07/07/2019- Patient presented in office to [name], NP for preventative exam. DM controlled with diet. Considering metformin.  Lab work ordered: CBC, CMP, lipid panel, TSH, A1c. Patient to continue on current regimen and follow up with GI for cirrhosis of liver without ascites  Consult Visit: 09/08/2019- Gastroenterology- Patient presented to Dr. Laren Everts.   08/17/2019- Optometry- Patient presented to [facility] for headache.   06/21/2019- Patient presented to Dr. Franchot Gallo for malignant neoplasm.   Medications: Outpatient Encounter Medications as of 11/25/2019  Medication Sig Note  . acetaminophen (TYLENOL) 500 MG tablet Take 1,000 mg by mouth 2 (two) times daily as needed (pain).   Marland Kitchen amLODipine (NORVASC) 10 MG tablet Take 1 tablet (10 mg total) by mouth daily.   Marland Kitchen apixaban (ELIQUIS) 5 MG TABS tablet Take 5 mg by mouth 2 (two) times daily.   . Ascorbic Acid (VITAMIN C) 1000 MG tablet Take 1,000 mg by mouth daily. Patient reports,1 tablet twice daily   . Blood Glucose Monitoring Suppl (ONE TOUCH ULTRA 2) w/Device KIT USE TO TEST BLOOD GLUCOSE TWICE DAILY   . Calcium Carbonate (CALCIUM-CARB  600 PO) Take 2 tablets by mouth 2 (two) times daily.   . Cholecalciferol (D3-1000 PO) Take 1 capsule by mouth daily.  11/25/2019: Patient reports taking 2 tablets in the morning.   Marland Kitchen glucose blood test strip USE TO TEST BLOOD GLUCOSE TWICE DAILY   . hydrochlorothiazide (HYDRODIURIL) 25 MG tablet Take 1 tablet (25 mg total) by mouth daily.   Marland Kitchen losartan (COZAAR) 100 MG tablet TAKE ONE TABLET (100 mg total) BY MOUTH DAILY   . Multiple Vitamins-Minerals (ICAPS AREDS 2 PO) Take 1 tablet by mouth 2 (two) times daily.   . Omega-3 Fatty Acids (FISH OIL PO) Take 1 capsule by mouth 2 (two) times daily.   Marland Kitchen omeprazole (PRILOSEC) 20 MG capsule Take 20 mg by mouth daily. 1 capsule twice daily 11/25/2019: Patient reports taking 1 capsule twice daily  . ONE TOUCH ULTRA TEST test strip USE TO TEST BLOOD GLUCOSE TWICE DAILY   . ONETOUCH DELICA LANCETS 74Q MISC 2 strips by Does not apply route daily.   . potassium chloride (MICRO-K) 10 MEQ CR capsule Take 1 capsule (10 mEq total) by mouth daily.   . Calcium Carb-Cholecalciferol (CALCIUM 600+D) 600-800 MG-UNIT TABS Take 1 tablet by mouth daily. 2 tablets in the AM and 2 tablets in the PM    No facility-administered encounter medications on file as of 11/25/2019.     Current Diagnosis/Assessment:  Goals Addressed            This Visit's Progress   . Pharmacy Care Plan       Current Barriers:  .  Chronic Disease Management support, education, and care coordination needs related to HTN, DMII, and History of PE, Cirrhosis, GERD, Elevated triglycerides, Osteoarthritis  Pharmacist Clinical Goal(s):  Marland Kitchen Maintain blood pressure within provider goal: (130/88mHg or 140/958mg)  . Maintain A1c <7.0% . Continue working on diet modifications (eating less fats, incorporating vegetables, fruits, nuts, using salt substitutes). . Reduce GERD/ acid reflux symptoms.   Interventions: . Comprehensive medication review performed. . Discussed eating DASH diet:  following a  diet emphasizing fruits and vegetables and low-fat dairy products along with whole grains, fish, poultry, and nuts. Reducing red meats and sugars.  . Discussed using a salt substitute to replace your salt if you need flavor.  . Discussed monitoring for signs and symptoms for bleeding (coughing up blood, prolonged nose bleeds, black, tarry stools) . Discussed non-pharmacological interventions for acid reflux. Take measures to prevent acid reflux, such as avoiding spicy foods, avoiding caffeine, avoid laying down a few hours after eating, and raising the head of the bed.  Patient Self Care Activities:  . Calls provider office for new concerns or questions . Continue current medications as directed by providers.  . Continue at home blood pressure readings. . Obtain diabetic eye exam every 1 to 2 years.  . Obtain at least once yearly foot exams.  Initial goal documentation        Diabetes   Recent Relevant Labs: Lab Results  Component Value Date/Time   HGBA1C 6.3 07/07/2019 09:20 AM   HGBA1C 6.1 04/23/2018 08:25 AM     Checking BG: Daily  Recent FBG Readings: 134-144 mg/dl (can go as high as 170).   Patient has failed these meds in past: none  Patient is currently controlled on the following medications: diet.   Last diabetic Eye exam:  Lab Results  Component Value Date/Time   HMDIABEYEEXA No Retinopathy 04/06/2016 12:00 AM  - Patient mentions obtaining eye exam every year.   Last diabetic Foot exam:  Lab Results  Component Value Date/Time   HMDIABFOOTEX Abstracted/Dr. DoMarily Memos3/15/2018 12:00 AM  - Recommended foot exam at least once a year.   We discussed: diet extensively.  - Patient working on limiting sugars, carbs, and refined and processed foods.  Plan Continue control with diet.    Hypertension   BP today is:  <130/80  Office blood pressures are  BP Readings from Last 3 Encounters:  07/07/19 (!) 176/80  05/25/18 (!) 160/68  04/23/18 132/60    Patient has failed these meds in the past: none   Patient checks BP at home not checking at home.  He states he lost his BP monitor.  Patient home BP readings are ranging:130/ 80-90 mmHg (last BP readings he remembers).     Patient is controlled on:  - amlodipine 1019m1 tablet daily - hydrochlorothiazide 71m57m tablet daily - losartan 100mg67mtablet daily  We discussed diet and exercise extensively  - DASH diet:  following a diet emphasizing fruits and vegetables and low-fat dairy products along with whole grains, fish, poultry, and nuts. Reducing red meats and sugars.  - Reducing the amount of salt intake to <1500mg/39mday.  - Recommend using a salt substitute to replace your salt if you need flavor. (patient using No Salt seasoning).  Plan Patient plans to obtain BP machine.  Will follow up in 1 month for BP readings.  Continue current medications and control with diet and exercise.    History of PE  Patient has failed these meds in past:  Warfarin  Patient is currently controlled on the following medications: Eliquis 32m, 1 tablet twice daily  We discussed:  monitoring for signs and symptoms for bleeding (coughing up blood, prolonged nose bleeds, black, tarry stools)  Plan Continue current medications.   GERD  Patient has failed these meds in past: ranitidine   Patient is currently controlled on the following medications: Omeprazole 273m 1 capsule twice daily  - Patient reports still having acid reflux when eating his trigger foods or laying down immediately after eating.   We discussed: non-pharmacological interventions for acid reflux. Take measures to prevent acid reflux, such as avoiding spicy foods, avoiding caffeine, avoid laying down a few hours after eating, and raising the head of the bed.      Plan  Continue current medications.   Elevated triglycerides   Lipid Panel     Component Value Date/Time   CHOL 191 07/07/2019 0920   TRIG 205.0 (H)  07/07/2019 0920   HDL 55.20 07/07/2019 0920   CHOLHDL 3 07/07/2019 0920   VLDL 41.0 (H) 07/07/2019 0920   LDLCALC 86 04/23/2018 0825   LDLDIRECT 100.0 07/07/2019 0920     ASCVD 10-year risk: unable to calculate due to age.   Patient has failed these meds in past: none   Patient is currently uncontrolled on the following medications:omega-3, 1 capsule twice daily.    We discussed:  diet and exercise extensively. - Discussed cutting back on fats.   Plan Continue current medications and control with diet and exercise.    Osteoarthritis  Patient has failed these meds in past: none  Patient is currently managed on the following medications: Tylenol 50035m2 tablets twice daily as needed for pain, CBD oil as needed  We discussed:  APAP maximum daily amount: 2000m74m patient has cirrhosis.  Plan Continue current medications.    Medication Management   [facility] [place] Follow up Telephone follow up in 1 months for BP readings.  Follow up appointment in 6 months.    AnneAnson CroftsarmD Clinical Pharmacist [facility] [phone]

## 2019-11-25 ENCOUNTER — Ambulatory Visit: Payer: PPO

## 2019-11-25 ENCOUNTER — Other Ambulatory Visit: Payer: Self-pay

## 2019-11-25 ENCOUNTER — Telehealth: Payer: Self-pay

## 2019-11-25 DIAGNOSIS — I10 Essential (primary) hypertension: Secondary | ICD-10-CM

## 2019-11-25 DIAGNOSIS — E119 Type 2 diabetes mellitus without complications: Secondary | ICD-10-CM

## 2019-11-25 NOTE — Telephone Encounter (Signed)
I am requesting an ambulatory referral to CCM be placed for this patient. Referral will need to have 2 current diagnosis attached to it. Thank you!  

## 2019-11-25 NOTE — Patient Instructions (Addendum)
Visit Information  Goals Addressed            This Visit's Progress   . Pharmacy Care Plan       Current Barriers:  . Chronic Disease Management support, education, and care coordination needs related to HTN, DMII, and History of PE, Cirrhosis, GERD, Elevated triglycerides, Osteoarthritis  Pharmacist Clinical Goal(s):  Marland Kitchen Maintain blood pressure within provider goal: (130/100mHg or 140/985mg)  . Maintain A1c <7.0% . Continue working on diet modifications (eating less fats, incorporating vegetables, fruits, nuts, using salt substitutes). . Reduce GERD/ acid reflux symptoms.   Interventions: . Comprehensive medication review performed. . Discussed eating DASH diet:  following a diet emphasizing fruits and vegetables and low-fat dairy products along with whole grains, fish, poultry, and nuts. Reducing red meats and sugars.  . Discussed using a salt substitute to replace your salt if you need flavor.  . Discussed monitoring for signs and symptoms for bleeding (coughing up blood, prolonged nose bleeds, black, tarry stools) . Discussed non-pharmacological interventions for acid reflux. Take measures to prevent acid reflux, such as avoiding spicy foods, avoiding caffeine, avoid laying down a few hours after eating, and raising the head of the bed.  Patient Self Care Activities:  . Calls provider office for new concerns or questions . Continue current medications as directed by providers.  . Continue at home blood pressure readings. . Obtain diabetic eye exam every 1 to 2 years.  . Obtain at least once yearly foot exams.  Initial goal documentation        Dakota Lewis given information about Chronic Care Management services today including:  1. CCM service includes personalized support from designated clinical staff supervised by his physician, including individualized plan of care and coordination with other care providers 2. 24/7 contact phone numbers for assistance for urgent and  routine care needs. 3. Service will only be billed when office clinical staff spend 20 minutes or more in a month to coordinate care. 4. Only one practitioner may furnish and bill the service in a calendar month. 5. The patient may stop CCM services at any time (effective at the end of the month) by phone call to the office staff. 6. The patient will be responsible for cost sharing (co-pay) of up to 20% of the service fee (after annual deductible is met).  Patient agreed to services and verbal consent obtained.   Print copy of patient instructions provided.  Telephone follow up appointment with pharmacy team member scheduled for: 05/25/2020  AnAnson CroftsPharmD Clinical Pharmacist [facility] [phone]  High Triglycerides Eating Plan Triglycerides are a type of fat in the blood. High levels of triglycerides can increase your risk of heart disease and stroke. If your triglyceride levels are high, choosing the right foods can help lower your triglycerides and keep your heart healthy. Work with your health care provider or a diet and nutrition specialist (dietitian) to develop an eating plan that is right for you. What are tips for following this plan? General guidelines   Lose weight, if you are overweight. For most people, losing 5-10 lbs (2-5 kg) helps lower triglyceride levels. A weight-loss plan may include. ? 30 minutes of exercise at least 5 days a week. ? Reducing the amount of calories, sugar, and fat you eat.  Eat a wide variety of fresh fruits, vegetables, and whole grains. These foods are high in fiber.  Eat foods that contain healthy fats, such as fatty  fish, nuts, seeds, and olive oil.  Avoid foods that are high in added sugar, added salt (sodium), saturated fat, and trans fat.  Avoid low-fiber, refined carbohydrates such as white bread, crackers, noodles, and white rice.  Avoid foods with partially hydrogenated oils (trans fats),  such as fried foods or stick margarine.  Limit alcohol intake to no more than 1 drink a day for nonpregnant women and 2 drinks a day for men. One drink equals 12 oz of beer, 5 oz of wine, or 1 oz of hard liquor. Your health care provider may recommend that you drink less depending on your overall health. Reading food labels  Check food labels for the amount of saturated fat. Choose foods with no or very little saturated fat.  Check food labels for the amount of trans fat. Choose foods with no trans fat.  Check food labels for the amount of cholesterol. Choose foods low in cholesterol. Ask your dietitian how much cholesterol you should have each day.  Check food labels for the amount of sodium. Choose foods with less than 140 milligrams (mg) per serving. Shopping  Buy dairy products labeled as nonfat (skim) or low-fat (1%).  Avoid buying processed or prepackaged foods. These are often high in added sugar, sodium, and fat. Cooking  Choose healthy fats when cooking, such as olive oil or canola oil.  Cook foods using lower fat methods, such as baking, broiling, boiling, or grilling.  Make your own sauces, dressings, and marinades when possible, instead of buying them. Store-bought sauces, dressings, and marinades are often high in sodium and sugar. Meal planning  Eat more home-cooked food and less restaurant, buffet, and fast food.  Eat fatty fish at least 2 times each week. Examples of fatty fish include salmon, trout, mackerel, tuna, and herring.  If you eat whole eggs, do not eat more than 3 egg yolks per week. What foods are recommended? The items listed may not be a complete list. Talk with your dietitian about what dietary choices are best for you. Grains Whole wheat or whole grain breads, crackers, cereals, and pasta. Unsweetened oatmeal. Bulgur. Barley. Quinoa. Brown rice. Whole wheat flour tortillas. Vegetables Fresh or frozen vegetables. Low-sodium canned  vegetables. Fruits All fresh, canned (in natural juice), or frozen fruits. Meats and other protein foods Skinless chicken or [place]. Ground chicken or [place]. Lean cuts of pork, trimmed of fat. Fish and seafood, especially salmon, trout, and herring. Egg whites. Dried beans, peas, or lentils. Unsalted nuts or seeds. Unsalted canned beans. Natural peanut or almond butter. Dairy Low-fat dairy products. Skim or low-fat (1%) milk. Reduced fat (2%) and low-sodium cheese. Low-fat ricotta cheese. Low-fat cottage cheese. Plain, low-fat yogurt. Fats and oils Tub margarine without trans fats. Light or reduced-fat mayonnaise. Light or reduced-fat salad dressings. Avocado. Safflower, olive, sunflower, soybean, and canola oils. What foods are not recommended? The items listed may not be a complete list. Talk with your dietitian about what dietary choices are best for you. Grains White bread. White (regular) pasta. White rice. Cornbread. Bagels. Pastries. Crackers that contain trans fat. Vegetables Creamed or fried vegetables. Vegetables in a cheese sauce. Fruits Sweetened dried fruit. Canned fruit in syrup. Fruit juice. Meats and other protein foods Fatty cuts of meat. Ribs. Chicken wings. Berniece Salines. Sausage. Bologna. Salami. Chitterlings. Fatback. Hot dogs. Bratwurst. Packaged lunch meats. Dairy Whole or reduced-fat (2%) milk. Half-and-half. Cream cheese. Full-fat or sweetened yogurt. Full-fat cheese. Nondairy creamers. Whipped toppings. Processed cheese or cheese spreads. Cheese curds. Beverages Alcohol.  Sweetened drinks, such as soda, lemonade, fruit drinks, or punches. Fats and oils Butter. Stick margarine. Lard. Shortening. Ghee. Bacon fat. Tropical oils, such as coconut, palm kernel, or palm oils. Sweets and desserts Corn syrup. Sugars. Honey. Molasses. Candy. Jam and jelly. Syrup. Sweetened cereals. Cookies. Pies. Cakes. Donuts. Muffins. Ice cream. Condiments Store-bought sauces, dressings, and  marinades that are high in sugar, such as ketchup and barbecue sauce. Summary  High levels of triglycerides can increase the risk of heart disease and stroke. Choosing the right foods can help lower your triglycerides.  Eat plenty of fresh fruits, vegetables, and whole grains. Choose low-fat dairy and lean meats. Eat fatty fish at least twice a week.  Avoid processed and prepackaged foods with added sugar, sodium, saturated fat, and trans fat.  If you need suggestions or have questions about what types of food are good for you, talk with your health care provider or a dietitian. This information is not intended to replace advice given to you by your health care provider. Make sure you discuss any questions you have with your health care provider. Document Revised: 09/05/2017 Document Reviewed: 11/26/2016 Elsevier Patient Education  [place].  Heartburn Heartburn is a type of pain or discomfort that can happen in the throat or chest. It is often described as a burning pain. It may also cause a bad, acid-like taste in the mouth. Heartburn may feel worse when you lie down or bend over. It may be worse at night. It may be caused by stomach contents that move back up (reflux) into the tube that connects the mouth with the stomach (esophagus). Follow these instructions at home: Eating and drinking   Avoid certain foods and drinks as told by your doctor. This may include: ? Coffee and tea (with or without caffeine). ? Drinks that have alcohol. ? Energy drinks and sports drinks. ? Carbonated drinks or sodas. ? Chocolate and cocoa. ? Peppermint and mint flavorings. ? Garlic and onions. ? Horseradish. ? Spicy and acidic foods, such as:  Peppers.  Chili powder and curry powder.  Vinegar.  Hot sauces and BBQ sauce. ? Citrus fruit juices and citrus fruits, such as:  Oranges.  Lemons.  Limes. ? Tomato-based foods, such as:  Red sauce and pizza with red  sauce.  Chili.  Salsa. ? Fried and fatty foods, such as:  Donuts.  [place] fries and potato chips.  High-fat dressings. ? High-fat meats, such as:  Hot dogs and sausage.  Rib eye steak.  Ham and bacon. ? High-fat dairy items, such as:  Whole milk.  Butter.  Cream cheese.  Eat small meals often. Avoid eating large meals.  Avoid drinking large amounts of liquid with your meals.  Avoid eating meals during the 2-3 hours before bedtime.  Avoid lying down right after you eat.  Do not exercise right after you eat. Lifestyle      If you are overweight, lose an amount of weight that is healthy for you. Ask your doctor about a safe weight loss goal.  Do not use any products that contain nicotine or tobacco, including cigarettes, e-cigarettes, and chewing tobacco. These can make your symptoms worse. If you need help quitting, ask your doctor.  Wear loose clothes. Do not wear anything tight around your waist.  Raise (elevate) the head of your bed about 6 inches (15 cm) when you sleep.  Try to lower your stress. If you need help doing this, ask your doctor. General instructions  Pay attention  to any changes in your symptoms.  Take over-the-counter and prescription medicines only as told by your doctor. ? Do not take aspirin, ibuprofen, or other NSAIDs unless your doctor says it is okay. ? Stop medicines only as told by your doctor.  Keep all follow-up visits as told by your doctor. This is important. Contact a doctor if:  You have new symptoms.  You lose weight and you do not know why it is happening.  You have trouble swallowing, or it hurts to swallow.  You have wheezing or a cough that keeps happening.  Your symptoms do not get better with treatment.  You have heartburn often for more than 2 weeks. Get help right away if:  You have pain in your arms, neck, jaw, teeth, or back.  You feel sweaty, dizzy, or light-headed.  You have chest pain or  shortness of breath.  You throw up (vomit) and your throw up looks like blood or coffee grounds.  Your poop (stool) is bloody or black. These symptoms may represent a serious problem that is an emergency. Do not wait to see if the symptoms will go away. Get medical help right away. Call your local emergency services (911 in the [place] Do not drive yourself to the hospital. Summary  Heartburn is a type of pain that can happen in the throat or chest. It can feel like a burning pain. It may also cause a bad, acid-like taste in the mouth.  You may need to avoid certain foods and drinks to help your symptoms. Ask your doctor what foods and drinks you should avoid.  Take over-the-counter and prescription medicines only as told by your doctor. Do not take aspirin, ibuprofen, or other NSAIDs unless your doctor told you to do so.  Contact your doctor if your symptoms do not get better or they get worse. This information is not intended to replace advice given to you by your health care provider. Make sure you discuss any questions you have with your health care provider. Document Revised: 02/23/2018 Document Reviewed: 02/23/2018 Elsevier Patient Education  [place].

## 2019-11-28 ENCOUNTER — Other Ambulatory Visit: Payer: Self-pay | Admitting: Adult Health

## 2019-11-28 DIAGNOSIS — E119 Type 2 diabetes mellitus without complications: Secondary | ICD-10-CM

## 2019-11-28 DIAGNOSIS — I10 Essential (primary) hypertension: Secondary | ICD-10-CM

## 2020-01-05 DIAGNOSIS — B029 Zoster without complications: Secondary | ICD-10-CM | POA: Diagnosis not present

## 2020-01-05 DIAGNOSIS — R509 Fever, unspecified: Secondary | ICD-10-CM | POA: Diagnosis not present

## 2020-01-05 DIAGNOSIS — R519 Headache, unspecified: Secondary | ICD-10-CM | POA: Diagnosis not present

## 2020-01-21 DIAGNOSIS — B029 Zoster without complications: Secondary | ICD-10-CM | POA: Diagnosis not present

## 2020-01-21 DIAGNOSIS — R21 Rash and other nonspecific skin eruption: Secondary | ICD-10-CM | POA: Diagnosis not present

## 2020-02-06 DIAGNOSIS — B029 Zoster without complications: Secondary | ICD-10-CM | POA: Diagnosis not present

## 2020-02-06 DIAGNOSIS — B0229 Other postherpetic nervous system involvement: Secondary | ICD-10-CM | POA: Diagnosis not present

## 2020-02-08 ENCOUNTER — Other Ambulatory Visit: Payer: Self-pay

## 2020-02-08 ENCOUNTER — Encounter: Payer: Self-pay | Admitting: Adult Health

## 2020-02-08 ENCOUNTER — Ambulatory Visit (INDEPENDENT_AMBULATORY_CARE_PROVIDER_SITE_OTHER): Payer: PPO | Admitting: Adult Health

## 2020-02-08 VITALS — BP 134/74 | Temp 98.0°F | Wt 189.0 lb

## 2020-02-08 DIAGNOSIS — R21 Rash and other nonspecific skin eruption: Secondary | ICD-10-CM

## 2020-02-08 MED ORDER — TRIAMCINOLONE ACETONIDE 0.1 % EX CREA: 1.0000 "application " | TOPICAL_CREAM | Freq: Two times a day (BID) | CUTANEOUS | 0 refills | Status: DC

## 2020-02-08 NOTE — Progress Notes (Signed)
Subjective:    Patient ID: Dakota Lewis, male    DOB: 1932/11/03, 86 y.o.   MRN: [id number]  HPI 86 year old male who  has a past medical history of Bilateral pulmonary embolism ([place]), Chronic cough, DM (diabetes mellitus) ([place]), DVT (deep venous thrombosis) ([place]), Gastrointestinal bleed, H/O cardiovascular stress test (05/07/2011), H/O Doppler ultrasound (2013), H/O Doppler ultrasound (2012), H/O echocardiogram (03/14/2011), H/O exercise stress test (2006), Hiatal hernia, Hypertension, and Prostate cancer ([place]).  He presents to the office today for follow-up regarding a rash on both sides of his forehead.  He reports that he has been seen at urgent care 3 times since January 05, 2020 and has been diagnosed with shingles.  He is taken a total of 4 weeks of gabapentin which he reports helps with the pain.  His rash "burns and itches".  At times it becomes red but he has not had any blisters or vesicles.  He does report the Gabapentin helps with his discomfort.   Review of Systems See HPI   Past Medical History:  Diagnosis Date  . Bilateral pulmonary embolism (HCC)    lovenox & coumadin thearpy  . Chronic cough   . DM (diabetes mellitus) ([place])   . DVT (deep venous thrombosis) (HCC)    left lower  . Gastrointestinal bleed    prior  . H/O cardiovascular stress test 05/07/2011   normal study, low risk scan  . H/O Doppler ultrasound 2013   venous duplex doppler  . H/O Doppler ultrasound 2012   venous duplex doppler  . H/O echocardiogram 03/14/2011   EF 65-70%  . H/O exercise stress test 2006   neg bruce protocol excercise stress test  . Hiatal hernia   . Hypertension   . Prostate cancer [facility])    s/p radiation    Social History   Socioeconomic History  . Marital status: Married    Spouse name: Not on file  . Number of children: Not on file  . Years of education: Not on file  . Highest education level: Not on file  Occupational History  . Not on file  Tobacco Use  . Smoking  status: Former Smoker    Packs/day: 0.75    Years: 20.00    Pack years: 15.00  . Smokeless tobacco: Never Used  Substance and Sexual Activity  . Alcohol use: No  . Drug use: No  . Sexual activity: Not on file  Other Topics Concern  . Not on file  Social History Narrative  . Not on file   Social Determinants of Health   Financial Resource Strain:   . Difficulty of Paying Living Expenses:   Food Insecurity:   . Worried About Charity fundraiser in the Last Year:   . Arboriculturist in the Last Year:   Transportation Needs:   . Film/video editor (Medical):   Marland Kitchen Lack of Transportation (Non-Medical):   Physical Activity:   . Days of Exercise per Week:   . Minutes of Exercise per Session:   Stress:   . Feeling of Stress :   Social Connections:   . Frequency of Communication with Friends and Family:   . Frequency of Social Gatherings with Friends and Family:   . Attends Religious Services:   . Active Member of Clubs or Organizations:   . Attends Archivist Meetings:   Marland Kitchen Marital Status:   Intimate Partner Violence:   . Fear of Current or Ex-Partner:   .  Emotionally Abused:   Marland Kitchen Physically Abused:   . Sexually Abused:     Past Surgical History:  Procedure Laterality Date  . event monitor     2012  . HERNIA REPAIR Bilateral    1991, 1992  . HIATAL HERNIA REPAIR     1997  . IVC FILTER INSERTION    . PROSTATE SURGERY      Family History  Problem Relation Age of Onset  . Stroke Mother   . Kidney disease Father   . Heart disease Brother   . Arthritis Brother   . Heart disease Brother   . Prostate cancer Brother     Allergies  Allergen Reactions  . Lidocaine     ANTI ITCH CREAM, NO SPECIFICATIONS IN RECORDS.  Marland Kitchen Lisinopril     cough    Current Outpatient Medications on File Prior to Visit  Medication Sig Dispense Refill  . acetaminophen (TYLENOL) 500 MG tablet Take 1,000 mg by mouth 2 (two) times daily as needed (pain).    Marland Kitchen amLODipine  (NORVASC) 10 MG tablet Take 1 tablet (10 mg total) by mouth daily. 90 tablet 3  . apixaban (ELIQUIS) 5 MG TABS tablet Take 5 mg by mouth 2 (two) times daily.    . Ascorbic Acid (VITAMIN C) 1000 MG tablet Take 1,000 mg by mouth daily. Patient reports,1 tablet twice daily    . Blood Glucose Monitoring Suppl (ONE TOUCH ULTRA 2) w/Device KIT USE TO TEST BLOOD GLUCOSE TWICE DAILY 1 each 0  . Calcium Carb-Cholecalciferol (CALCIUM 600+D) 600-800 MG-UNIT TABS Take 1 tablet by mouth daily. 2 tablets in the AM and 2 tablets in the PM    . Calcium Carbonate (CALCIUM-CARB 600 PO) Take 2 tablets by mouth 2 (two) times daily.    . Cholecalciferol (D3-1000 PO) Take 1 capsule by mouth daily.     Marland Kitchen gabapentin (NEURONTIN) 100 MG capsule TAKE 1 CAPSULE BY MOUTH THREE TIMES DAILY FOR 14 DAYS    . glucose blood test strip USE TO TEST BLOOD GLUCOSE TWICE DAILY 200 each 3  . hydrochlorothiazide (HYDRODIURIL) 25 MG tablet Take 1 tablet (25 mg total) by mouth daily. 90 tablet 3  . losartan (COZAAR) 100 MG tablet TAKE ONE TABLET (100 mg total) BY MOUTH DAILY 90 tablet 3  . Multiple Vitamins-Minerals (ICAPS AREDS 2 PO) Take 1 tablet by mouth 2 (two) times daily.    . Omega-3 Fatty Acids (FISH OIL PO) Take 1 capsule by mouth 2 (two) times daily.    Marland Kitchen omeprazole (PRILOSEC) 20 MG capsule Take 20 mg by mouth daily. 1 capsule twice daily    . ONE TOUCH ULTRA TEST test strip USE TO TEST BLOOD GLUCOSE TWICE DAILY 200 each 3  . ONETOUCH DELICA LANCETS 02R MISC 2 strips by Does not apply route daily. 200 each 3  . potassium chloride (MICRO-K) 10 MEQ CR capsule Take 1 capsule (10 mEq total) by mouth daily. 90 capsule 3  . valACYclovir (VALTREX) 1000 MG tablet Take 1,000 mg by mouth 2 (two) times daily.     No current facility-administered medications on file prior to visit.    BP 134/74   Temp 98 F (36.7 C)   Wt 189 lb (85.7 kg)   BMI 29.60 kg/m       Objective:   Physical Exam Vitals and nursing note reviewed.   Constitutional:      Appearance: Normal appearance.  HENT:     Head:      Comments:  Hypopigmented patches of papules noted bilaterally on forehead.  Vesicles noted Skin:    General: Skin is warm and dry.  Neurological:     General: No focal deficit present.     Mental Status: He is alert and oriented to person, place, and time.  Psychiatric:        Mood and Affect: Mood normal.        Behavior: Behavior normal.        Thought Content: Thought content normal.        Judgment: Judgment normal.       Assessment & Plan:  1. Facial rash -Rash of unknown cause.  Advised the patient that this was not shingles as it does past midline and there is no vesicles.  The rash is hypopigmented hated.  We will trial him on some Kenalog cream twice a day for 7 to 10 days.  He was also advised to follow-up with dermatology - triamcinolone cream (KENALOG) 0.1 %; Apply 1 application topically 2 (two) times daily.  Dispense: 30 g; Refill: 0  [name], NP

## 2020-02-08 NOTE — Patient Instructions (Addendum)
I do not know what the rash is but it is not shingles.   I am going to send in a steroid cream for you to apply twice a day   Please follow up with dermatology if the the steroid cream does not help

## 2020-02-08 NOTE — Progress Notes (Signed)
   Subjective:    Patient ID: Dakota Lewis, male    DOB: [birth year], 86 y.o.   MRN: [id number]  HPI    Review of Systems     Objective:   Physical Exam        Assessment & Plan:

## 2020-03-10 DIAGNOSIS — K7581 Nonalcoholic steatohepatitis (NASH): Secondary | ICD-10-CM | POA: Diagnosis not present

## 2020-03-13 ENCOUNTER — Other Ambulatory Visit: Payer: Self-pay | Admitting: Nurse Practitioner

## 2020-03-13 DIAGNOSIS — K7581 Nonalcoholic steatohepatitis (NASH): Secondary | ICD-10-CM

## 2020-03-20 ENCOUNTER — Ambulatory Visit
Admission: RE | Admit: 2020-03-20 | Discharge: 2020-03-20 | Disposition: A | Discharge status: Home / Self Care | Payer: PPO | Source: Ambulatory Visit | Attending: Nurse Practitioner | Admitting: Nurse Practitioner

## 2020-03-20 DIAGNOSIS — K7581 Nonalcoholic steatohepatitis (NASH): Secondary | ICD-10-CM

## 2020-03-20 DIAGNOSIS — K760 Fatty (change of) liver, not elsewhere classified: Secondary | ICD-10-CM | POA: Diagnosis not present

## 2020-04-25 DIAGNOSIS — K7460 Unspecified cirrhosis of liver: Secondary | ICD-10-CM | POA: Diagnosis not present

## 2020-05-17 ENCOUNTER — Telehealth: Payer: Self-pay | Admitting: *Deleted

## 2020-05-17 NOTE — Telephone Encounter (Signed)
Pt scheduled for 05/23/20 @ 2 PM.  Nothing further needed.

## 2020-05-17 NOTE — Telephone Encounter (Signed)
The picture he sent to you looks like a varicose vein.  We can see him in the office

## 2020-05-17 NOTE — Telephone Encounter (Signed)
Patient daughter called after hours line. Daughter reports her father has a blood vessel that has popped up on the front of his leg that is about 6-8 inches long. He said the area feels like something is crawling there. That feeling comes and goes. His ankles were swollen last night. He has a Hx of blood clots in his legs. He is on an anti-coagulant. Triage nurse referred patient to PCP

## 2020-05-17 NOTE — Telephone Encounter (Signed)
Called and spoke to [place].  Has had 1 incident of SOB when he went to check the mail.  [name] reports it may have been the heat.  No swelling or redness.  She reports he has a hx of DVT.  She will be sending me a picture of the area.  She reports it being more "blue" today.

## 2020-05-22 ENCOUNTER — Telehealth: Payer: Self-pay

## 2020-05-22 DIAGNOSIS — I10 Essential (primary) hypertension: Secondary | ICD-10-CM

## 2020-05-22 DIAGNOSIS — E119 Type 2 diabetes mellitus without complications: Secondary | ICD-10-CM

## 2020-05-22 NOTE — Progress Notes (Addendum)
Chronic Care Management Pharmacy Assistant   Name: Dakota Lewis  MRN: [id number] DOB: 29-Sep-1933  Reason for Encounter: Disease State  Patient Questions:  1.  Have you seen any other providers since your last visit? Yes, with PCP  2.  Any changes in your medicines or health? No   PCP : [name], NP   Their chronic conditions include: HTN, history of PE, DM, Prostate Cancer, Cirrhosis, GERD, elevated triglycerides, Osteoarthritis  Office Visits: 02-08-2020 (PCP) Patient presented in the office with Dr. Carlisle Cater for a follow-up regarding a rash ion both sides of his forehead. Patient reports being seen at urgent care three times since January 05, 2020. The patient sates the rash burns and itches. He took Gabapentin for the pain. Per Dr. Carlisle Cater, the rash was hypopigmented related. The patient was started on a trial of Kenalog cream twice a day for seven to ten days and was advised to follow-up with dermatology.  TE: 05-17-2020 Patient daughter called after hours line. Daughter reports her father has a blood vessel that has popped up on the front of his leg that is about 6-8 inches long. He said the area feels like something is crawling there. That feeling comes and goes. His ankles were swollen last night. He has a Hx of blood clots in his legs. He is on an anti-coagulant. Triage nurse referred patient to PCP. Patient was scheduled to come into the office with PCP on 05-23-2020.  Allergies:   Allergies  Allergen Reactions  . Lidocaine     ANTI ITCH CREAM, NO SPECIFICATIONS IN RECORDS.  Marland Kitchen Lisinopril     cough    Medications: Outpatient Encounter Medications as of 05/22/2020  Medication Sig Note  . acetaminophen (TYLENOL) 500 MG tablet Take 1,000 mg by mouth 2 (two) times daily as needed (pain).   Marland Kitchen amLODipine (NORVASC) 10 MG tablet Take 1 tablet (10 mg total) by mouth daily.   Marland Kitchen apixaban (ELIQUIS) 5 MG TABS tablet Take 5 mg by mouth 2 (two) times daily.   . Ascorbic Acid  (VITAMIN C) 1000 MG tablet Take 1,000 mg by mouth daily. Patient reports,1 tablet twice daily   . Blood Glucose Monitoring Suppl (ONE TOUCH ULTRA 2) w/Device KIT USE TO TEST BLOOD GLUCOSE TWICE DAILY   . Calcium Carb-Cholecalciferol (CALCIUM 600+D) 600-800 MG-UNIT TABS Take 1 tablet by mouth daily. 2 tablets in the AM and 2 tablets in the PM   . Calcium Carbonate (CALCIUM-CARB 600 PO) Take 2 tablets by mouth 2 (two) times daily.   . Cholecalciferol (D3-1000 PO) Take 1 capsule by mouth daily.  11/25/2019: Patient reports taking 2 tablets in the morning.   . gabapentin (NEURONTIN) 100 MG capsule TAKE 1 CAPSULE BY MOUTH THREE TIMES DAILY FOR 14 DAYS   . glucose blood test strip USE TO TEST BLOOD GLUCOSE TWICE DAILY   . hydrochlorothiazide (HYDRODIURIL) 25 MG tablet Take 1 tablet (25 mg total) by mouth daily.   Marland Kitchen losartan (COZAAR) 100 MG tablet TAKE ONE TABLET (100 mg total) BY MOUTH DAILY   . Multiple Vitamins-Minerals (ICAPS AREDS 2 PO) Take 1 tablet by mouth 2 (two) times daily.   . Omega-3 Fatty Acids (FISH OIL PO) Take 1 capsule by mouth 2 (two) times daily.   Marland Kitchen omeprazole (PRILOSEC) 20 MG capsule Take 20 mg by mouth daily. 1 capsule twice daily 11/25/2019: Patient reports taking 1 capsule twice daily  . ONE TOUCH ULTRA TEST test strip USE TO TEST BLOOD GLUCOSE TWICE  DAILY   . ONETOUCH DELICA LANCETS 62G MISC 2 strips by Does not apply route daily.   . potassium chloride (MICRO-K) 10 MEQ CR capsule Take 1 capsule (10 mEq total) by mouth daily.   Marland Kitchen triamcinolone cream (KENALOG) 0.1 % Apply 1 application topically 2 (two) times daily.   . valACYclovir (VALTREX) 1000 MG tablet Take 1,000 mg by mouth 2 (two) times daily.    No facility-administered encounter medications on file as of 05/22/2020.    Current Diagnosis: Patient Active Problem List   Diagnosis Date Noted  . Diet-controlled diabetes mellitus ([place]) 12/30/2017  . Atypical chest pain 11/28/2017  . Pulmonary embolism ([place]) 07/09/2017  .  Prostate cancer ([place])   . Hypertension     Goals Addressed   None    Reviewed chart prior to disease state call. Spoke with patient regarding BP  Recent Office Vitals: BP Readings from Last 3 Encounters:  02/08/20 134/74  07/07/19 (!) 176/80  05/25/18 (!) 160/68   Pulse Readings from Last 3 Encounters:  05/25/18 (!) 58  02/25/18 62  12/09/17 (!) 54    Wt Readings from Last 3 Encounters:  02/08/20 189 lb (85.7 kg)  07/07/19 193 lb (87.5 kg)  05/25/18 184 lb 9.6 oz (83.7 kg)     Kidney Function Lab Results  Component Value Date/Time   CREATININE 1.03 07/07/2019 09:20 AM   CREATININE 1.11 04/23/2018 08:25 AM   GFR 68.71 07/07/2019 09:20 AM   GFRNONAA >60 03/15/2011 04:00 AM   GFRAA  03/15/2011 04:00 AM    >60        The eGFR has been calculated using the MDRD equation. This calculation has not been validated in all clinical situations. eGFR's persistently <60 mL/min signify possible Chronic Kidney Disease.    BMP Latest Ref Rng & Units 07/07/2019 05/25/2018 04/23/2018  Glucose 70 - 99 mg/dL 123(H) - 134(H)  BUN 6 - 23 mg/dL 23 - 28(H)  Creatinine 0.40 - 1.50 mg/dL 1.03 - 1.11  Sodium 135 - 145 mEq/L 138 - 138  Potassium 3.5 - 5.1 mEq/L 4.7 - 5.0  Chloride 96 - 112 mEq/L 100 - 103  CO2 19 - 32 mEq/L 29 - 29  Calcium 8.4 - 10.5 mg/dL 10.2 10.7(H) 10.7(H)    05-23-2020 Attempting to complete Disease State call and reschedule their up and coming appointment with the Clinical Pharmacist. I was unable to make contact with the patient. [place]. 05-23-2020 Patient returned my call.  . Current antihypertensive regimen:   amlodipine 58m, 1 tablet daily  hydrochlorothiazide 240m 1 tablet daily   losartan 10065m1 tablet daily  . How often are you checking your Blood Pressure? infrequently patient does not check his blood pressure at home because he does not have a machine. . Current home BP readings: 124/68 today at the office with Dr. NafCarlisle Cater What recent  interventions/DTPs have been made by any provider to improve Blood Pressure control since last CPP Visit: none . Any recent hospitalizations or ED visits since last visit with CPP? Yes . What diet changes have been made to improve Blood Pressure Control?  o Patient states he usually has ham and biscuits with an egg and four cups of coffee. He may have sandwich for lunch. Dinner usually consists of a cooked meal, enjoys having vegetables from his garden. . What exercise is being done to improve your Blood Pressure Control?  o Patient states he is active. Works in the yard, and helping his grandson with preparing  a house. He mostly rides a tractor.  Adherence Review: Is the patient currently on ACE/ARB medication? Yes Does the patient have >5 day gap between last estimated fill dates? No   Lafonda Mosses  Follow-Up:  Scheduled Follow-Up With Clinical Pharmacist  Patient prefers early morning telephone appointments  Fanny Skates, [place] Pharmacist Assistant [phone]  Addendum 06/06/20:   Last office blood pressure in range but previously uncontrolled and patient not monitoring at home. Patient not following HTN friendly diet (High Sodium, High caffeine).   Doristine Section Clinical Pharmacist [facility]

## 2020-05-23 ENCOUNTER — Ambulatory Visit (INDEPENDENT_AMBULATORY_CARE_PROVIDER_SITE_OTHER): Payer: PPO | Admitting: Adult Health

## 2020-05-23 ENCOUNTER — Encounter: Payer: Self-pay | Admitting: Adult Health

## 2020-05-23 ENCOUNTER — Other Ambulatory Visit: Payer: Self-pay

## 2020-05-23 ENCOUNTER — Ambulatory Visit (INDEPENDENT_AMBULATORY_CARE_PROVIDER_SITE_OTHER)
Admission: RE | Admit: 2020-05-23 | Discharge: 2020-05-23 | Disposition: A | Discharge status: Home / Self Care | Payer: PPO | Source: Ambulatory Visit | Attending: Adult Health | Admitting: Adult Health

## 2020-05-23 VITALS — BP 124/68 | Temp 97.8°F | Wt 190.0 lb

## 2020-05-23 DIAGNOSIS — M25572 Pain in left ankle and joints of left foot: Secondary | ICD-10-CM | POA: Diagnosis not present

## 2020-05-23 DIAGNOSIS — I8391 Asymptomatic varicose veins of right lower extremity: Secondary | ICD-10-CM

## 2020-05-23 DIAGNOSIS — M25512 Pain in left shoulder: Secondary | ICD-10-CM

## 2020-05-23 DIAGNOSIS — G8929 Other chronic pain: Secondary | ICD-10-CM

## 2020-05-23 DIAGNOSIS — S93402A Sprain of unspecified ligament of left ankle, initial encounter: Secondary | ICD-10-CM | POA: Diagnosis not present

## 2020-05-23 MED ORDER — METHYLPREDNISOLONE ACETATE 80 MG/ML IJ SUSP
80.0000 mg | Freq: Once | INTRAMUSCULAR | Status: AC
  Administered 2020-05-23: 80 mg via INTRA_ARTICULAR

## 2020-05-23 MED ORDER — METHYLPREDNISOLONE ACETATE 80 MG/ML IJ SUSP: 80.0000 mg | Freq: Once | INTRAMUSCULAR | Status: DC

## 2020-05-23 NOTE — Progress Notes (Signed)
Subjective:    Patient ID: Dakota Lewis, male    DOB: 11-19-1932, 86 y.o.   MRN: [id number]  HPI  86 year old male who  has a past medical history of Bilateral pulmonary embolism ([place]), Chronic cough, DM (diabetes mellitus) ([place]), DVT (deep venous thrombosis) ([place]), Gastrointestinal bleed, H/O cardiovascular stress test (05/07/2011), H/O Doppler ultrasound (2013), H/O Doppler ultrasound (2012), H/O echocardiogram (03/14/2011), H/O exercise stress test (2006), Hiatal hernia, Hypertension, and Prostate cancer ([place]).  He presents with his daughter for multiple issues.   Varicose Vein -this is a recent development.  Sudden onset of varicose vein on his right leg.  Denies any pain but states "feels like I have a bug crawling inside my vein".  He is an active 86 year old male and is on Eliquis.  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  She is interested in having him see vein and vascular.  Additionally, he reports chronic pain in his left shoulder that is worse in the morning and eases up slightly in the afternoon.  Has no issues with range of motion but has pain with doing so.  He also reports left ankle pain that has been present for 2 to 3 months.  The only contributing factor he can think of is when he had a large piece of wood rolled up on his left foot at the beginning of June.  He was in [place] over the summer and had to buy a cane in order to walk due to the pain.  Review of Systems See HPI   Past Medical History:  Diagnosis Date  . Bilateral pulmonary embolism (HCC)    lovenox & coumadin thearpy  . Chronic cough   . DM (diabetes mellitus) ([place])   . DVT (deep venous thrombosis) (HCC)    left lower  . Gastrointestinal bleed    prior  . H/O cardiovascular stress test 05/07/2011   normal study, low risk scan  . H/O Doppler ultrasound 2013   venous duplex doppler  . H/O  Doppler ultrasound 2012   venous duplex doppler  . H/O echocardiogram 03/14/2011   EF 65-70%  . H/O exercise stress test 2006   neg bruce protocol excercise stress test  . Hiatal hernia   . Hypertension   . Prostate cancer [facility])    s/p radiation    Social History   Socioeconomic History  . Marital status: Married    Spouse name: Not on file  . Number of children: Not on file  . Years of education: Not on file  . Highest education level: Not on file  Occupational History  . Not on file  Tobacco Use  . Smoking status: Former Smoker    Packs/day: 0.75    Years: 20.00    Pack years: 15.00  . Smokeless tobacco: Never Used  Vaping Use  . Vaping Use: Never used  Substance and Sexual Activity  . Alcohol use: No  . Drug use: No  . Sexual activity: Not on file  Other Topics Concern  . Not on file  Social History Narrative  . Not on file   Social Determinants of Health   Financial Resource Strain:   . Difficulty of Paying Living Expenses:   Food Insecurity:   . Worried About Charity fundraiser in the Last Year:   . [place] in the Last Year:  Transportation Needs:   . Film/video editor (Medical):   Marland Kitchen Lack of Transportation (Non-Medical):   Physical Activity:   . Days of Exercise per Week:   . Minutes of Exercise per Session:   Stress:   . Feeling of Stress :   Social Connections:   . Frequency of Communication with Friends and Family:   . Frequency of Social Gatherings with Friends and Family:   . Attends Religious Services:   . Active Member of Clubs or Organizations:   . Attends Archivist Meetings:   Marland Kitchen Marital Status:   Intimate Partner Violence:   . Fear of Current or Ex-Partner:   . Emotionally Abused:   Marland Kitchen Physically Abused:   . Sexually Abused:     Past Surgical History:  Procedure Laterality Date  . event monitor     2012  . HERNIA REPAIR Bilateral    1991, 1992  . HIATAL HERNIA REPAIR     1997  . IVC FILTER INSERTION    .  PROSTATE SURGERY      Family History  Problem Relation Age of Onset  . Stroke Mother   . Kidney disease Father   . Heart disease Brother   . Arthritis Brother   . Heart disease Brother   . Prostate cancer Brother     Allergies  Allergen Reactions  . Lidocaine     ANTI ITCH CREAM, NO SPECIFICATIONS IN RECORDS.  Marland Kitchen Lisinopril     cough    Current Outpatient Medications on File Prior to Visit  Medication Sig Dispense Refill  . acetaminophen (TYLENOL) 500 MG tablet Take 1,000 mg by mouth 2 (two) times daily as needed (pain).    Marland Kitchen amLODipine (NORVASC) 10 MG tablet Take 1 tablet (10 mg total) by mouth daily. 90 tablet 3  . apixaban (ELIQUIS) 5 MG TABS tablet Take 5 mg by mouth 2 (two) times daily.    . Ascorbic Acid (VITAMIN C) 1000 MG tablet Take 1,000 mg by mouth daily. Patient reports,1 tablet twice daily    . Blood Glucose Monitoring Suppl (ONE TOUCH ULTRA 2) w/Device KIT USE TO TEST BLOOD GLUCOSE TWICE DAILY 1 each 0  . Calcium Carb-Cholecalciferol (CALCIUM 600+D) 600-800 MG-UNIT TABS Take 1 tablet by mouth daily. 2 tablets in the AM and 2 tablets in the PM    . Calcium Carbonate (CALCIUM-CARB 600 PO) Take 2 tablets by mouth 2 (two) times daily.    . Cholecalciferol (D3-1000 PO) Take 1 capsule by mouth daily.     Marland Kitchen gabapentin (NEURONTIN) 100 MG capsule TAKE 1 CAPSULE BY MOUTH THREE TIMES DAILY FOR 14 DAYS    . glucose blood test strip USE TO TEST BLOOD GLUCOSE TWICE DAILY 200 each 3  . hydrochlorothiazide (HYDRODIURIL) 25 MG tablet Take 1 tablet (25 mg total) by mouth daily. 90 tablet 3  . losartan (COZAAR) 100 MG tablet TAKE ONE TABLET (100 mg total) BY MOUTH DAILY 90 tablet 3  . Multiple Vitamins-Minerals (ICAPS AREDS 2 PO) Take 1 tablet by mouth 2 (two) times daily.    . Omega-3 Fatty Acids (FISH OIL PO) Take 1 capsule by mouth 2 (two) times daily.    Marland Kitchen omeprazole (PRILOSEC) 20 MG capsule Take 20 mg by mouth daily. 1 capsule twice daily    . ONE TOUCH ULTRA TEST test strip USE  TO TEST BLOOD GLUCOSE TWICE DAILY 200 each 3  . ONETOUCH DELICA LANCETS 81O MISC 2 strips by Does not apply route daily. [place]  each 3  . potassium chloride (MICRO-K) 10 MEQ CR capsule Take 1 capsule (10 mEq total) by mouth daily. 90 capsule 3  . triamcinolone cream (KENALOG) 0.1 % Apply 1 application topically 2 (two) times daily. 30 g 0  . valACYclovir (VALTREX) 1000 MG tablet Take 1,000 mg by mouth 2 (two) times daily.     No current facility-administered medications on file prior to visit.    BP 124/68   Temp 97.8 F (36.6 C)   Wt 190 lb (86.2 kg)   BMI 29.76 kg/m       Objective:   Physical Exam Vitals and nursing note reviewed.  Constitutional:      Appearance: Normal appearance.  Musculoskeletal:        General: Tenderness present.     Right shoulder: Normal.     Left shoulder: No deformity, tenderness, bony tenderness or crepitus. Normal range of motion. Normal strength. Normal pulse.     Right ankle: Normal.     Left ankle: No swelling, deformity or ecchymosis. No tenderness. Normal range of motion.       Legs:     Comments: He is able to perform all range of motion exercises but has pain with doing so.  No loss of range of motion throughout left shoulder  Neurological:     General: No focal deficit present.     Mental Status: He is alert and oriented to person, place, and time.  Psychiatric:        Mood and Affect: Mood normal.        Behavior: Behavior normal.        Thought Content: Thought content normal.        Judgment: Judgment normal.       Assessment & Plan:  1. Chronic left shoulder pain -Likely osteoarthritis causing pain.  We discussed options of treatment and ultimately cited on Depo-Medrol injection. Shoulder injection Verbal consent obtained and verified. Sterile betadine prep. Furthur cleansed with alcohol. Topical analgesic spray: Ethyl chloride. Joint: left subacromial injection Approached in typical fashion with: posterior approach  Completed without difficulty Meds: 3 cc lidocaine 2% no epi, 1 cc depomedrol 74m/cc Needle:1.5 inch 25 gauge Aftercare instructions and Red flags advised. Immediate improvement in pain noted  - methylPREDNISolone acetate (DEPO-MEDROL) injection 80 mg  2. Acute left ankle pain  - DG Ankle Complete Left; Future   3. Varicose veins of right lower extremity, unspecified whether complicated  - Ambulatory referral to Vascular Surgery  CDorothyann Peng NP

## 2020-05-25 ENCOUNTER — Telehealth: Payer: Self-pay | Admitting: Adult Health

## 2020-05-25 ENCOUNTER — Telehealth: Payer: PPO

## 2020-05-25 NOTE — Telephone Encounter (Signed)
Updated patient's daughter on the patient's x-ray of his ankle which showed  IMPRESSION: 1. No acute osseous abnormality, left ankle. 2. Mild degenerative changes.  Advised conservative measures such as Tylenol, rest and ice

## 2020-06-16 LAB — CBC AND DIFFERENTIAL
HCT: 42 (ref 41–53)
Hemoglobin: 14.1 (ref 13.5–17.5)
Neutrophils Absolute: 55
Platelets: 196 (ref 150–399)
WBC: 6.7

## 2020-06-16 LAB — HEMOGLOBIN A1C: Hemoglobin A1C: 6.8

## 2020-06-16 LAB — BASIC METABOLIC PANEL
BUN: 32 — AB (ref 4–21)
CO2: 30 — AB (ref 13–22)
Chloride: 101 (ref 99–108)
Creatinine: 1 (ref 0.6–1.3)
Glucose: 125
Potassium: 4.8 (ref 3.4–5.3)
Sodium: 135 — AB (ref 137–147)

## 2020-06-16 LAB — COMPREHENSIVE METABOLIC PANEL
Albumin: 3.9 (ref 3.5–5.0)
GFR calc non Af Amer: >60

## 2020-06-16 LAB — TSH: TSH: 1.21 (ref 0.41–5.90)

## 2020-06-16 LAB — VITAMIN B12: Vitamin B-12: 255

## 2020-06-16 LAB — LIPID PANEL
Cholesterol: 206 — AB (ref 0–200)
HDL: 83 — AB (ref 35–70)
LDL Cholesterol: 105
Triglycerides: 92 (ref 40–160)

## 2020-06-16 LAB — CBC: RBC: 4.58 (ref 3.87–5.11)

## 2020-06-16 LAB — HEPATIC FUNCTION PANEL
ALT: 49 — AB (ref 10–40)
AST: 24 (ref 14–40)
Bilirubin, Direct: 0.2 (ref 0.01–0.4)
Bilirubin, Total: 0.7

## 2020-06-30 ENCOUNTER — Other Ambulatory Visit: Payer: Self-pay

## 2020-06-30 DIAGNOSIS — I8390 Asymptomatic varicose veins of unspecified lower extremity: Secondary | ICD-10-CM

## 2020-07-07 ENCOUNTER — Ambulatory Visit (INDEPENDENT_AMBULATORY_CARE_PROVIDER_SITE_OTHER): Payer: PPO | Admitting: Adult Health

## 2020-07-07 ENCOUNTER — Other Ambulatory Visit: Payer: Self-pay

## 2020-07-07 ENCOUNTER — Encounter: Payer: Self-pay | Admitting: Adult Health

## 2020-07-07 VITALS — BP 152/68 | HR 60 | Temp 98.3°F | Ht 67.0 in | Wt 198.2 lb

## 2020-07-07 DIAGNOSIS — I2699 Other pulmonary embolism without acute cor pulmonale: Secondary | ICD-10-CM | POA: Diagnosis not present

## 2020-07-07 DIAGNOSIS — K7460 Unspecified cirrhosis of liver: Secondary | ICD-10-CM

## 2020-07-07 DIAGNOSIS — Z23 Encounter for immunization: Secondary | ICD-10-CM | POA: Diagnosis not present

## 2020-07-07 DIAGNOSIS — I10 Essential (primary) hypertension: Secondary | ICD-10-CM

## 2020-07-07 DIAGNOSIS — E782 Mixed hyperlipidemia: Secondary | ICD-10-CM | POA: Diagnosis not present

## 2020-07-07 DIAGNOSIS — E119 Type 2 diabetes mellitus without complications: Secondary | ICD-10-CM

## 2020-07-07 DIAGNOSIS — C61 Malignant neoplasm of prostate: Secondary | ICD-10-CM | POA: Diagnosis not present

## 2020-07-07 DIAGNOSIS — Z0000 Encounter for general adult medical examination without abnormal findings: Secondary | ICD-10-CM

## 2020-07-07 MED ORDER — AMLODIPINE BESYLATE 10 MG PO TABS: 10.0000 mg | ORAL_TABLET | Freq: Every day | ORAL | 3 refills | Status: DC

## 2020-07-07 MED ORDER — HYDROCHLOROTHIAZIDE 25 MG PO TABS: 25.0000 mg | ORAL_TABLET | Freq: Every day | ORAL | 3 refills | Status: DC

## 2020-07-07 MED ORDER — LOSARTAN POTASSIUM 100 MG PO TABS: ORAL_TABLET | ORAL | 3 refills | Status: DC

## 2020-07-07 MED ORDER — POTASSIUM CHLORIDE ER 10 MEQ PO CPCR: 10.0000 meq | ORAL_CAPSULE | Freq: Every day | ORAL | 3 refills | Status: DC

## 2020-07-07 NOTE — Patient Instructions (Signed)
It was great seeing you today   I reviewed all your labs from the [place] and everything looks fine, we do not need to recheck blood work today   I have sent in refills of your blood pressure medication   I will see you back in 1 year or sooner if needed

## 2020-07-07 NOTE — Progress Notes (Signed)
Subjective:    Patient ID: Dakota Lewis, male    DOB: 08-03-1933, 86 y.o.   MRN: [id number]  HPI Patient presents for yearly preventative medicine examination. He is a pleasant 86 year old male who  has a past medical history of Bilateral pulmonary embolism ([place]), Chronic cough, DM (diabetes mellitus) ([place]), DVT (deep venous thrombosis) ([place]), Gastrointestinal bleed, H/O cardiovascular stress test (05/07/2011), H/O Doppler ultrasound (2013), H/O Doppler ultrasound (2012), H/O echocardiogram (03/14/2011), H/O exercise stress test (2006), Hiatal hernia, Hypertension, and Prostate cancer ([place]).   He gets care at the [facility] and recently had labs done in September 2021  DM -currently diet controlled. His last A1c at the [place] was 6.8  Lab Results  Component Value Date   HGBA1C 6.8 06/16/2020   Hypertension-takes Norvasc 10 mg, hydrochlorothiazide 25 mg, and losartan 100 mg daily.  He does monitor his blood pressures at home and consistently gets readings in the 130s over 80s to 90s. He did not take his medications this mornign.   BP Readings from Last 3 Encounters:  07/07/20 (!) 152/68  05/23/20 124/68  02/08/20 134/74   History of PE -a well controlled with Eliquis 5 mg twice daily.  He denies black tarry stools, nosebleeds, or coughing up blood.  GERD-omeprazole 20 mg twice daily.  For the most part he is well controlled but still has acid reflux symptoms would trigger foods or laying down immediately after eating  Hyperlipidemia-takes omega-3 fatty acids twice daily. Lab Results  Component Value Date   CHOL 206 (A) 06/16/2020   HDL 83 (A) 06/16/2020   LDLCALC 105 06/16/2020   LDLDIRECT 100.0 07/07/2019   TRIG 92 06/16/2020   CHOLHDL 3 07/07/2019   H/h prostate cancer-at his post radiation therapy in 2009.  He is followed by urology yearly  NASH-followed by GI in [place]  All immunizations and health maintenance protocols were reviewed with the patient and needed orders were  placed.  Appropriate screening laboratory values were ordered for the patient including screening of hyperlipidemia, renal function and hepatic function. If indicated by BPH, a PSA was ordered.  Medication reconciliation,  past medical history, social history, problem list and allergies were reviewed in detail with the patient  Goals were established with regard to weight loss, exercise, and  diet in compliance with medications  End of life planning was discussed.  He has an advanced directive and living will   Review of Systems  Constitutional: Negative.   HENT: Negative.   Eyes: Negative.   Respiratory: Negative.   Cardiovascular: Negative.   Gastrointestinal: Negative.   Endocrine: Negative.   Genitourinary: Negative.   Musculoskeletal: Positive for arthralgias and back pain.  Skin: Negative.   Allergic/Immunologic: Negative.   Neurological: Negative.   Hematological: Negative.   Psychiatric/Behavioral: Negative.   All other systems reviewed and are negative.  Past Medical History:  Diagnosis Date  . Bilateral pulmonary embolism (HCC)    lovenox & coumadin thearpy  . Chronic cough   . DM (diabetes mellitus) ([place])   . DVT (deep venous thrombosis) (HCC)    left lower  . Gastrointestinal bleed    prior  . H/O cardiovascular stress test 05/07/2011   normal study, low risk scan  . H/O Doppler ultrasound 2013   venous duplex doppler  . H/O Doppler ultrasound 2012   venous duplex doppler  . H/O echocardiogram 03/14/2011   EF 65-70%  . H/O exercise stress test 2006   neg bruce protocol excercise stress test  .  Hiatal hernia   . Hypertension   . Prostate cancer [facility])    s/p radiation    Social History   Socioeconomic History  . Marital status: Married    Spouse name: Not on file  . Number of children: Not on file  . Years of education: Not on file  . Highest education level: Not on file  Occupational History  . Not on file  Tobacco Use  . Smoking status:  Former Smoker    Packs/day: 0.75    Years: 20.00    Pack years: 15.00  . Smokeless tobacco: Never Used  Vaping Use  . Vaping Use: Never used  Substance and Sexual Activity  . Alcohol use: No  . Drug use: No  . Sexual activity: Not on file  Other Topics Concern  . Not on file  Social History Narrative  . Not on file   Social Determinants of Health   Financial Resource Strain:   . Difficulty of Paying Living Expenses: Not on file  Food Insecurity:   . Worried About Charity fundraiser in the Last Year: Not on file  . Ran Out of Food in the Last Year: Not on file  Transportation Needs:   . Lack of Transportation (Medical): Not on file  . Lack of Transportation (Non-Medical): Not on file  Physical Activity:   . Days of Exercise per Week: Not on file  . Minutes of Exercise per Session: Not on file  Stress:   . Feeling of Stress : Not on file  Social Connections:   . Frequency of Communication with Friends and Family: Not on file  . Frequency of Social Gatherings with Friends and Family: Not on file  . Attends Religious Services: Not on file  . Active Member of Clubs or Organizations: Not on file  . Attends Archivist Meetings: Not on file  . Marital Status: Not on file  Intimate Partner Violence:   . Fear of Current or Ex-Partner: Not on file  . Emotionally Abused: Not on file  . Physically Abused: Not on file  . Sexually Abused: Not on file    Past Surgical History:  Procedure Laterality Date  . event monitor     2012  . HERNIA REPAIR Bilateral    1991, 1992  . HIATAL HERNIA REPAIR     1997  . IVC FILTER INSERTION    . PROSTATE SURGERY      Family History  Problem Relation Age of Onset  . Stroke Mother   . Kidney disease Father   . Heart disease Brother   . Arthritis Brother   . Heart disease Brother   . Prostate cancer Brother     Allergies  Allergen Reactions  . Lidocaine     ANTI ITCH CREAM, NO SPECIFICATIONS IN RECORDS.  Marland Kitchen Lisinopril      cough    Current Outpatient Medications on File Prior to Visit  Medication Sig Dispense Refill  . acetaminophen (TYLENOL) 500 MG tablet Take 1,000 mg by mouth 2 (two) times daily as needed (pain).    Marland Kitchen amLODipine (NORVASC) 10 MG tablet Take 1 tablet (10 mg total) by mouth daily. 90 tablet 3  . apixaban (ELIQUIS) 5 MG TABS tablet Take 5 mg by mouth 2 (two) times daily.    . Ascorbic Acid (VITAMIN C) 1000 MG tablet Take 1,000 mg by mouth daily. Patient reports,1 tablet twice daily    . Blood Glucose Monitoring Suppl (ONE TOUCH ULTRA 2) w/Device  KIT USE TO TEST BLOOD GLUCOSE TWICE DAILY 1 each 0  . Calcium Carbonate (CALCIUM-CARB 600 PO) Take 2 tablets by mouth 2 (two) times daily.    . Cholecalciferol (D3-1000 PO) Take 1 capsule by mouth daily.     Marland Kitchen glucose blood test strip USE TO TEST BLOOD GLUCOSE TWICE DAILY 200 each 3  . hydrochlorothiazide (HYDRODIURIL) 25 MG tablet Take 1 tablet (25 mg total) by mouth daily. 90 tablet 3  . losartan (COZAAR) 100 MG tablet TAKE ONE TABLET (100 mg total) BY MOUTH DAILY 90 tablet 3  . Multiple Vitamins-Minerals (ICAPS AREDS 2 PO) Take 1 tablet by mouth 2 (two) times daily.    . Omega-3 Fatty Acids (FISH OIL PO) Take 1 capsule by mouth 2 (two) times daily.    Marland Kitchen omeprazole (PRILOSEC) 20 MG capsule Take 20 mg by mouth daily. 1 capsule twice daily    . ONE TOUCH ULTRA TEST test strip USE TO TEST BLOOD GLUCOSE TWICE DAILY 200 each 3  . ONETOUCH DELICA LANCETS 47W MISC 2 strips by Does not apply route daily. 200 each 3  . potassium chloride (MICRO-K) 10 MEQ CR capsule Take 1 capsule (10 mEq total) by mouth daily. 90 capsule 3   No current facility-administered medications on file prior to visit.    BP (!) 152/68 (BP Location: Left Arm, Patient Position: Sitting, Cuff Size: Normal)   Pulse 60   Temp 98.3 F (36.8 C) (Oral)   Ht 5' 7" (1.702 m)   Wt 198 lb 3 oz (89.9 kg)   SpO2 99%   BMI 31.04 kg/m       Objective:   Physical Exam Vitals and  nursing note reviewed.  Constitutional:      General: He is not in acute distress.    Appearance: Normal appearance. He is well-developed. He is obese.  HENT:     Head: Normocephalic and atraumatic.     Right Ear: Tympanic membrane, ear canal and external ear normal. There is no impacted cerumen.     Left Ear: Tympanic membrane, ear canal and external ear normal. There is no impacted cerumen.     Nose: Nose normal. No congestion or rhinorrhea.     Mouth/Throat:     Mouth: Mucous membranes are moist.     Pharynx: Oropharynx is clear. No oropharyngeal exudate or posterior oropharyngeal erythema.  Eyes:     General:        Right eye: No discharge.        Left eye: No discharge.     Extraocular Movements: Extraocular movements intact.     Conjunctiva/sclera: Conjunctivae normal.     Pupils: Pupils are equal, round, and reactive to light.  Neck:     Vascular: No carotid bruit.     Trachea: No tracheal deviation.  Cardiovascular:     Rate and Rhythm: Normal rate and regular rhythm.     Pulses: Normal pulses.     Heart sounds: Normal heart sounds. No murmur heard.  No friction rub. No gallop.   Pulmonary:     Effort: Pulmonary effort is normal. No respiratory distress.     Breath sounds: Normal breath sounds. No stridor. No wheezing, rhonchi or rales.  Chest:     Chest wall: No tenderness.  Abdominal:     General: Bowel sounds are normal. There is no distension.     Palpations: Abdomen is soft. There is no mass.     Tenderness: There is no abdominal tenderness. There  is no right CVA tenderness, left CVA tenderness, guarding or rebound.     Hernia: No hernia is present.  Musculoskeletal:        General: No swelling, tenderness, deformity or signs of injury. Normal range of motion.     Right lower leg: No edema.     Left lower leg: No edema.  Lymphadenopathy:     Cervical: No cervical adenopathy.  Skin:    General: Skin is warm and dry.     Capillary Refill: Capillary refill takes  less than 2 seconds.     Coloration: Skin is not jaundiced or pale.     Findings: No bruising, erythema, lesion or rash.  Neurological:     General: No focal deficit present.     Mental Status: He is alert and oriented to person, place, and time.     Cranial Nerves: No cranial nerve deficit.     Sensory: No sensory deficit.     Motor: No weakness.     Coordination: Coordination normal.     Gait: Gait normal.     Deep Tendon Reflexes: Reflexes normal.  Psychiatric:        Mood and Affect: Mood normal.        Behavior: Behavior normal.        Thought Content: Thought content normal.        Judgment: Judgment normal.        Assessment & Plan:  1. Routine general medical examination at a health care facility - Reviewed labs from the [place] - all stable. I do not feel as though we need to redo these today  2. Essential hypertension  - potassium chloride (MICRO-K) 10 MEQ CR capsule; Take 1 capsule (10 mEq total) by mouth daily.  Dispense: 90 capsule; Refill: 3 - amLODipine (NORVASC) 10 MG tablet; Take 1 tablet (10 mg total) by mouth daily.  Dispense: 90 tablet; Refill: 3 - hydrochlorothiazide (HYDRODIURIL) 25 MG tablet; Take 1 tablet (25 mg total) by mouth daily.  Dispense: 90 tablet; Refill: 3  3. Diet-controlled diabetes mellitus ([place]) - Continue monitoring Q6 Months   4. Pulmonary embolism, unspecified chronicity, unspecified pulmonary embolism type, unspecified whether acute cor pulmonale present (HCC) - Continue with Eliquis 5 mg BID  5. Cirrhosis of liver without ascites, unspecified hepatic cirrhosis type ([place]) - Follow up with GI  6. Prostate cancer ([place]) - Follow up with Urology   7. Mixed hyperlipidemia - Continue with Omega 3 fatty acids   8. Need for influenza vaccination  - Flu Vaccine QUAD High Dose(Fluad)   [name], NP

## 2020-07-17 ENCOUNTER — Ambulatory Visit (INDEPENDENT_AMBULATORY_CARE_PROVIDER_SITE_OTHER): Payer: PPO | Admitting: Physician Assistant

## 2020-07-17 ENCOUNTER — Ambulatory Visit (HOSPITAL_COMMUNITY)
Admission: RE | Admit: 2020-07-17 | Discharge: 2020-07-17 | Disposition: A | Discharge status: Home / Self Care | Payer: PPO | Source: Ambulatory Visit | Attending: Surgery | Admitting: Surgery

## 2020-07-17 ENCOUNTER — Other Ambulatory Visit: Payer: Self-pay

## 2020-07-17 VITALS — BP 149/75 | HR 51 | Temp 98.3°F | Resp 20 | Ht 67.0 in | Wt 193.7 lb

## 2020-07-17 DIAGNOSIS — I872 Venous insufficiency (chronic) (peripheral): Secondary | ICD-10-CM

## 2020-07-17 DIAGNOSIS — I8390 Asymptomatic varicose veins of unspecified lower extremity: Secondary | ICD-10-CM

## 2020-07-17 NOTE — Progress Notes (Signed)
Requested by:  [name], NP [facility] [place],  [place] [address]  Reason for consultation: Right lower extremity varicosities   History of Present Illness   Dakota Lewis is a 86 y.o. (May 04, 1933) male who presents for evaluation of right lower leg venous varicosity.  He noticed a bulging vein across his right anterior shin approximately 1 month ago.  He he describes a sensation of crawling underneath his skin.  He has mild chronic lower extremity edema.  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  His past medical history is significant for left lower extremity DVT and pulmonary embolism in approximately 2010.  He was initially placed on Coumadin and now is on lifelong Eliquis.  An IVC filter was also placed.  His daughter accompanies him to his visit today.  One of their concerns is the possibility of spontaneous bleeding from the varicosity and the fact that he is anticoagulated.  Venous symptoms include: (Left medial thigh aching. Burning sensation of both feet. Mild itching, swelling. No bleeding, ulcer. Onset/duration: 1 month Occupation: Retired, farm work currently Aggravating factors: None Alleviating factors: Elevation reduces edema Compression: He has been wearing some form of compression for many years.  The higher level of compression is very difficult for him to don and doff. Helps:  yes Pain medications: None Previous vein procedures: None History of DVT:  yes  Medical history reviewed.  Patient has history of left lower extremity DVT and pulmonary embolism on Eliquis  Past Medical History:  Diagnosis Date  . Bilateral pulmonary embolism (HCC)    lovenox & coumadin thearpy  . Chronic cough   . DM (diabetes mellitus) ([name])   . DVT (deep venous thrombosis) (HCC)    left lower  . Gastrointestinal bleed    prior  . H/O cardiovascular stress test 05/07/2011   normal study, low risk scan  . H/O Doppler ultrasound 2013   venous duplex doppler  . H/O Doppler  ultrasound 2012   venous duplex doppler  . H/O echocardiogram 03/14/2011   EF 65-70%  . H/O exercise stress test 2006   neg bruce protocol excercise stress test  . Hiatal hernia   . Hypertension   . Prostate cancer [facility])    s/p radiation    Past Surgical History:  Procedure Laterality Date  . event monitor     2012  . HERNIA REPAIR Bilateral    1991, 1992  . HIATAL HERNIA REPAIR     1997  . IVC FILTER INSERTION    . PROSTATE SURGERY      Social History   Socioeconomic History  . Marital status: Married    Spouse name: Not on file  . Number of children: Not on file  . Years of education: Not on file  . Highest education level: Not on file  Occupational History  . Not on file  Tobacco Use  . Smoking status: Former Smoker    Packs/day: 0.75    Years: 20.00    Pack years: 15.00  . Smokeless tobacco: Never Used  Vaping Use  . Vaping Use: Never used  Substance and Sexual Activity  . Alcohol use: No  . Drug use: No  . Sexual activity: Not on file  Other Topics Concern  . Not on file  Social History Narrative  . Not on file   Social Determinants of Health   Financial Resource Strain:   . Difficulty of Paying Living Expenses: Not on file  Food Insecurity:   .  Worried About Charity fundraiser in the Last Year: Not on file  . Ran Out of Food in the Last Year: Not on file  Transportation Needs:   . Lack of Transportation (Medical): Not on file  . Lack of Transportation (Non-Medical): Not on file  Physical Activity:   . Days of Exercise per Week: Not on file  . Minutes of Exercise per Session: Not on file  Stress:   . Feeling of Stress : Not on file  Social Connections:   . Frequency of Communication with Friends and Family: Not on file  . Frequency of Social Gatherings with Friends and Family: Not on file  . Attends Religious Services: Not on file  . Active Member of Clubs or Organizations: Not on file  . Attends Archivist Meetings: Not on file  .  Marital Status: Not on file  Intimate Partner Violence:   . Fear of Current or Ex-Partner: Not on file  . Emotionally Abused: Not on file  . Physically Abused: Not on file  . Sexually Abused: Not on file    Family History  Problem Relation Age of Onset  . Stroke Mother   . Kidney disease Father   . Heart disease Brother   . Arthritis Brother   . Heart disease Brother   . Prostate cancer Brother     Current Outpatient Medications  Medication Sig Dispense Refill  . acetaminophen (TYLENOL) 500 MG tablet Take 1,000 mg by mouth 2 (two) times daily as needed (pain).    Marland Kitchen amLODipine (NORVASC) 10 MG tablet Take 1 tablet (10 mg total) by mouth daily. 90 tablet 3  . apixaban (ELIQUIS) 5 MG TABS tablet Take 5 mg by mouth 2 (two) times daily.    . Ascorbic Acid (VITAMIN C) 1000 MG tablet Take 1,000 mg by mouth daily. Patient reports,1 tablet twice daily    . Blood Glucose Monitoring Suppl (ONE TOUCH ULTRA 2) w/Device KIT USE TO TEST BLOOD GLUCOSE TWICE DAILY 1 each 0  . Calcium Carbonate (CALCIUM-CARB 600 PO) Take 2 tablets by mouth 2 (two) times daily.    . Cholecalciferol (D3-1000 PO) Take 1 capsule by mouth daily.     Marland Kitchen glucose blood test strip USE TO TEST BLOOD GLUCOSE TWICE DAILY 200 each 3  . hydrochlorothiazide (HYDRODIURIL) 25 MG tablet Take 1 tablet (25 mg total) by mouth daily. 90 tablet 3  . losartan (COZAAR) 100 MG tablet TAKE ONE TABLET (100 mg total) BY MOUTH DAILY 90 tablet 3  . Multiple Vitamins-Minerals (ICAPS AREDS 2 PO) Take 1 tablet by mouth 2 (two) times daily.    . Omega-3 Fatty Acids (FISH OIL PO) Take 1 capsule by mouth 2 (two) times daily.    Marland Kitchen omeprazole (PRILOSEC) 20 MG capsule Take 20 mg by mouth daily. 1 capsule twice daily    . ONE TOUCH ULTRA TEST test strip USE TO TEST BLOOD GLUCOSE TWICE DAILY 200 each 3  . ONETOUCH DELICA LANCETS 19J MISC 2 strips by Does not apply route daily. 200 each 3  . potassium chloride (MICRO-K) 10 MEQ CR capsule Take 1 capsule (10 mEq  total) by mouth daily. 90 capsule 3   No current facility-administered medications for this visit.    Allergies  Allergen Reactions  . Lidocaine     ANTI ITCH CREAM, NO SPECIFICATIONS IN RECORDS.  Marland Kitchen Lisinopril     cough    REVIEW OF SYSTEMS (negative unless checked):   Cardiac:  _0  Chest pain  or chest pressure? _0  Shortness of breath upon activity? _1  Shortness of breath when lying flat? _2  Irregular heart rhythm?  Vascular:  _3  Pain in calf, thigh, or hip brought on by walking? _4  Pain in feet at night that wakes you up from your sleep? _5  Blood clot in your veins? _6  Leg swelling?  Pulmonary:  _7  Oxygen at home? _8  Productive cough? _9  Wheezing?  Neurologic:  _10  Sudden weakness in arms or legs? _11  Sudden numbness in arms or legs? _12  Sudden onset of difficult speaking or slurred speech? _13  Temporary loss of vision in one eye? _14  Problems with dizziness?  Gastrointestinal:  _15  Blood in stool? _16  Vomited blood?  Genitourinary:  _17  Burning when urinating? _18  Blood in urine?  Psychiatric:  _19  Major depression  Hematologic:  _20  Bleeding problems? _21  Problems with blood clotting?  Dermatologic:  _22  Rashes or ulcers?  Constitutional:  _23  Fever or chills?  Ear/Nose/Throat:  _24  Change in hearing? _25  Nose bleeds? _26  Sore throat?  Musculoskeletal:  _27  Back pain? _28  Joint pain? _29  Muscle pain?   Physical Examination     Vitals:   07/17/20 1404  BP: (!) 149/75  Pulse: (!) 51  Resp: 20  Temp: 98.3 F (36.8 C)  SpO2: 98%   General:  WDWN in NAD; vital signs documented above Gait: Unaided, no ataxia HENT: WNL, normocephalic Pulmonary: normal non-labored breathing , without Rales, rhonchi,  wheezing Cardiac: regular HR, without  Murmurs without carotid bruits Abdomen: soft, NT, no masses Skin: without rashes Vascular Exam/Pulses:  Right Left  Radial 2+ (normal) 2+ (normal)  Ulnar  not evaluated  not evaluated  Femoral 2+ (normal) 2+  (normal)  Popliteal 2+ (normal) 2+ (normal)  DP 2+ (normal) 2+ (normal)  PT 1+ (weak) 1+ (weak)   Extremities: with varicose veins, with reticular veins, with edema, with stasis pigmentation, without lipodermatosclerosis, without ulcers Musculoskeletal: no muscle wasting or atrophy  Neurologic: A&O X 3;  No focal weakness or paresthesias are detected Psychiatric:  The pt has Normal affect.       Non-invasive Vascular Imaging   BLE Venous Insufficiency Duplex (07/17/2020):   RLE:   Negative DVT and SVT,   Positive GSV reflux through to mid calf,  GSV diameter 0.41 to 0.69 from prox calf to SFJ  Negative SSV reflux ,  Positive deep venous reflux   Right:  - No evidence of deep vein thrombosis seen in the right lower extremity,  from the common femoral through the popliteal veins.  - Venous reflux is noted in the right greater saphenous vein in the thigh.  - Venous reflux is noted in the right greater saphenous vein in the calf.  - Venous reflux is noted in the right femoral vein.    Medical Decision Making   Dakota Lewis is a 86 y.o. 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  There is no evidence of deep venous thrombosis or arterial insufficiency.  There is evidence of right greater saphenous vein reflux with dilated vein.  I reviewed endovenous laser ablation and possible stab phlebectomy of his left lower leg varicosity.  He will continue lifestyle management of his symptoms prior to follow-up.   Based on the patient's history and examination, I recommend: Elevation, compression and follow-up.  We reviewed the risks of bleeding with Eliquis.  I encouraged him to keep an Ace wrap available in the event he has any mild trauma or spontaneous bleeding from  varicosities.  Advised him to apply compression and seek medical attention.  I discussed with the patient the use of  20-30 mm Hg compression  stockings and need for 3 month trial of such.  The patient will follow up in 3 months with Dr. Oneida Alar or Scot Dock.  Thank you for allowing [place] to participate in this patient's care.   Barbie Banner, PA-C [facility] Office: [phone]  07/17/2020, 12:37 PM  Clinic MD: Trula Slade

## 2020-07-24 ENCOUNTER — Ambulatory Visit: Payer: PPO | Admitting: Pharmacist

## 2020-07-24 DIAGNOSIS — E119 Type 2 diabetes mellitus without complications: Secondary | ICD-10-CM

## 2020-07-24 DIAGNOSIS — I10 Essential (primary) hypertension: Secondary | ICD-10-CM

## 2020-07-24 NOTE — Patient Instructions (Addendum)
Hi Dakota Lewis,  It was so lovely getting to speak with you over the phone! As we discussed, continue to add in more green vegetables to your diet as these do not have sugar in them and eating more can help lower blood sugars. I attached a guide for making some diet changes for lowering blood sugars and I hope it's helpful! Please continue checking blood pressure as you have been and limiting salt intake.  Please call me know if you need anything before our next touch base!  Best, Maddie  Jeni Salles, PharmD Clinical Pharmacist [facility]   Visit Information  Goals Addressed            This Visit's Progress   . Pharmacy Care Plan       Current Barriers:  . Chronic Disease Management support, education, and care coordination needs related to HTN, DMII, and History of PE, Cirrhosis, GERD, Elevated triglycerides, Osteoarthritis  Pharmacist Clinical Goal(s):  Marland Kitchen Maintain blood pressure within provider goal: (130/18mHg or 140/96mg)  . Maintain A1c <7.0% . Continue working on diet modifications (eating less fats, incorporating vegetables, fruits, nuts, using salt substitutes). . Reduce GERD/ acid reflux symptoms.   Interventions: . Comprehensive medication review performed. . Discussed eating DASH diet:  following a diet emphasizing fruits and vegetables and low-fat dairy products along with whole grains, fish, poultry, and nuts. Reducing red meats and sugars.  . Discussed using a salt substitute to replace your salt if you need flavor.  . Discussed eating more green vegetables in order to lower blood sugars and limiting starchy vegetables (potatoes, corn, peas) . Discussed monitoring for signs and symptoms for bleeding (coughing up blood, prolonged nose bleeds, black, tarry stools) . Discussed non-pharmacological interventions for acid reflux. Take measures to prevent acid reflux, such as avoiding spicy foods, avoiding caffeine, avoid laying down a  few hours after eating, and raising the head of the bed.  Patient Self Care Activities:  . Calls provider office for new concerns or questions . Continue current medications as directed by providers.  . Continue at home blood pressure readings. . Obtain diabetic eye exam every 1 to 2 years.  . Obtain at least once yearly foot exams. . Continue checking blood pressure at home.  Please see past updates related to this goal by clicking on the "Past Updates" button in the selected goal         The patient verbalized understanding of instructions provided today and agreed to receive a mailed copy of patient instruction and/or educational materials.  Telephone follow up appointment with pharmacy team member scheduled for: 3 months   Diabetes Mellitus and Nutrition, Adult When you have diabetes (diabetes mellitus), it is very important to have healthy eating habits because your blood sugar (glucose) levels are greatly affected by what you eat and drink. Eating healthy foods in the appropriate amounts, at about the same times every day, can help you:  Control your blood glucose.  Lower your risk of heart disease.  Improve your blood pressure.  Reach or maintain a healthy weight. Every person with diabetes is different, and each person has different needs for a meal plan. Your health care provider may recommend that you work with a diet and nutrition specialist (dietitian) to make a meal plan that is best for you. Your meal plan may vary depending on factors such as:  The calories you need.  The medicines you take.  Your weight.  Your blood glucose, blood pressure, and  cholesterol levels.  Your activity level.  Other health conditions you have, such as heart or kidney disease. How do carbohydrates affect me? Carbohydrates, also called carbs, affect your blood glucose level more than any other type of food. Eating carbs naturally raises the amount of glucose in your blood. Carb  counting is a method for keeping track of how many carbs you eat. Counting carbs is important to keep your blood glucose at a healthy level, especially if you use insulin or take certain oral diabetes medicines. It is important to know how many carbs you can safely have in each meal. This is different for every person. Your dietitian can help you calculate how many carbs you should have at each meal and for each snack. Foods that contain carbs include:  Bread, cereal, rice, pasta, and crackers.  Potatoes and corn.  Peas, beans, and lentils.  Milk and yogurt.  Fruit and juice.  Desserts, such as cakes, cookies, ice cream, and candy. How does alcohol affect me? Alcohol can cause a sudden decrease in blood glucose (hypoglycemia), especially if you use insulin or take certain oral diabetes medicines. Hypoglycemia can be a life-threatening condition. Symptoms of hypoglycemia (sleepiness, dizziness, and confusion) are similar to symptoms of having too much alcohol. If your health care provider says that alcohol is safe for you, follow these guidelines:  Limit alcohol intake to no more than 1 drink per day for nonpregnant women and 2 drinks per day for men. One drink equals 12 oz of beer, 5 oz of wine, or 1 oz of hard liquor.  Do not drink on an empty stomach.  Keep yourself hydrated with water, diet soda, or unsweetened iced tea.  Keep in mind that regular soda, juice, and other mixers may contain a lot of sugar and must be counted as carbs. What are tips for following this plan?  Reading food labels  Start by checking the serving size on the "Nutrition Facts" label of packaged foods and drinks. The amount of calories, carbs, fats, and other nutrients listed on the label is based on one serving of the item. Many items contain more than one serving per package.  Check the total grams (g) of carbs in one serving. You can calculate the number of servings of carbs in one serving by dividing  the total carbs by 15. For example, if a food has 30 g of total carbs, it would be equal to 2 servings of carbs.  Check the number of grams (g) of saturated and trans fats in one serving. Choose foods that have low or no amount of these fats.  Check the number of milligrams (mg) of salt (sodium) in one serving. Most people should limit total sodium intake to less than 2,300 mg per day.  Always check the nutrition information of foods labeled as "low-fat" or "nonfat". These foods may be higher in added sugar or refined carbs and should be avoided.  Talk to your dietitian to identify your daily goals for nutrients listed on the label. Shopping  Avoid buying canned, premade, or processed foods. These foods tend to be high in fat, sodium, and added sugar.  Shop around the outside edge of the grocery store. This includes fresh fruits and vegetables, bulk grains, fresh meats, and fresh dairy. Cooking  Use low-heat cooking methods, such as baking, instead of high-heat cooking methods like deep frying.  Cook using healthy oils, such as olive, canola, or sunflower oil.  Avoid cooking with butter, cream, or high-fat  meats. Meal planning  Eat meals and snacks regularly, preferably at the same times every day. Avoid going long periods of time without eating.  Eat foods high in fiber, such as fresh fruits, vegetables, beans, and whole grains. Talk to your dietitian about how many servings of carbs you can eat at each meal.  Eat 4-6 ounces (oz) of lean protein each day, such as lean meat, chicken, fish, eggs, or tofu. One oz of lean protein is equal to: ? 1 oz of meat, chicken, or fish. ? 1 egg. ?  cup of tofu.  Eat some foods each day that contain healthy fats, such as avocado, nuts, seeds, and fish. Lifestyle  Check your blood glucose regularly.  Exercise regularly as told by your health care provider. This may include: ? 150 minutes of moderate-intensity or vigorous-intensity exercise  each week. This could be brisk walking, biking, or water aerobics. ? Stretching and doing strength exercises, such as yoga or weightlifting, at least 2 times a week.  Take medicines as told by your health care provider.  Do not use any products that contain nicotine or tobacco, such as cigarettes and e-cigarettes. If you need help quitting, ask your health care provider.  Work with a Social worker or diabetes educator to identify strategies to manage stress and any emotional and social challenges. Questions to ask a health care provider  Do I need to meet with a diabetes educator?  Do I need to meet with a dietitian?  What number can I call if I have questions?  When are the best times to check my blood glucose? Where to find more information:  American Diabetes Association: diabetes.org  Academy of Nutrition and Dietetics: www.eatright.[employer] of Diabetes and Digestive and Kidney Diseases (NIH): [employer] Summary  A healthy meal plan will help you control your blood glucose and maintain a healthy lifestyle.  Working with a diet and nutrition specialist (dietitian) can help you make a meal plan that is best for you.  Keep in mind that carbohydrates (carbs) and alcohol have immediate effects on your blood glucose levels. It is important to count carbs and to use alcohol carefully. This information is not intended to replace advice given to you by your health care provider. Make sure you discuss any questions you have with your health care provider. Document Revised: 09/05/2017 Document Reviewed: 10/28/2016 Elsevier Patient Education  2020 [employer].

## 2020-07-24 NOTE — Chronic Care Management (AMB) (Signed)
Chronic Care Management Pharmacy  Name: Dakota Lewis  MRN: [id number] DOB: 08-Jul-1933  Initial Questions: 1. Have you seen any other providers since your last visit? Yes  2. Any changes in your medicines or health? No   Chief Complaint/ HPI  Dakota Lewis,  86 y.o. , male presents for their Follow-Up CCM visit with the clinical pharmacist via telephone.   PCP : [name], NP  Their chronic conditions include: HTN, history of PE, DM, Prostate Cancer, Cirrhosis, GERD, elevated triglycerides, Osteoarthritis  Office Visits: 07/07/20 [name], NP: Patient presented for chronic disease follow up. Influenza vaccine given during visit. Labs stable from [place] and did not redo. Continued current medications. Continued monitoring Q6 months for diabetes and still managed with diet. Follow up in 1 year.  05/23/20 [name], NP: Patient presented with chronic left shoulder pain follow up. Depo-medrol injection given. Referred to vascular surgery for varicose veins evaluation.  02/08/20  [name], NP: Patient presented with a facial rash. Ruled out shingles. Prescribed Kenalog cream BID for 7-10 days for follow up with dermatology.  Consult Visit: 07/17/20 Bing Ree, PA-C (vascular & vein): Patient presented for an initial evaluation of varicose veins. BP was slightly elevated in office. Recommended elevation, compression and follow up. Follow up in 3 months.  02/06/20 Mary Sella  09/08/2019- Gastroenterology- Patient presented to Dr. Laren Everts.   08/17/2019- Optometry- Patient presented to [facility] for headache.   06/21/2019- Patient presented to Dr. Franchot Gallo for malignant neoplasm.   Medications: Outpatient Encounter Medications as of 07/24/2020  Medication Sig Note  . amLODipine (NORVASC) 10 MG tablet Take 1 tablet (10 mg total) by mouth daily.   Marland Kitchen apixaban (ELIQUIS) 5 MG TABS tablet Take 5 mg by mouth 2 (two) times daily.   . hydrochlorothiazide  (HYDRODIURIL) 25 MG tablet Take 1 tablet (25 mg total) by mouth daily.   Marland Kitchen losartan (COZAAR) 100 MG tablet TAKE ONE TABLET (100 mg total) BY MOUTH DAILY   . omeprazole (PRILOSEC) 20 MG capsule Take 20 mg by mouth daily. 1 capsule twice daily 11/25/2019: Patient reports taking 1 capsule twice daily  . potassium chloride (MICRO-K) 10 MEQ CR capsule Take 1 capsule (10 mEq total) by mouth daily.   Marland Kitchen acetaminophen (TYLENOL) 500 MG tablet Take 1,000 mg by mouth 2 (two) times daily as needed (pain).   . Ascorbic Acid (VITAMIN C) 1000 MG tablet Take 1,000 mg by mouth daily. Patient reports,1 tablet twice daily   . Blood Glucose Monitoring Suppl (ONE TOUCH ULTRA 2) w/Device KIT USE TO TEST BLOOD GLUCOSE TWICE DAILY   . Calcium Carbonate (CALCIUM-CARB 600 PO) Take 2 tablets by mouth 2 (two) times daily.   . Cholecalciferol (D3-1000 PO) Take 1 capsule by mouth daily.  11/25/2019: Patient reports taking 2 tablets in the morning.   Marland Kitchen glucose blood test strip USE TO TEST BLOOD GLUCOSE TWICE DAILY   . Multiple Vitamins-Minerals (ICAPS AREDS 2 PO) Take 1 tablet by mouth 2 (two) times daily.   . Omega-3 Fatty Acids (FISH OIL PO) Take 1 capsule by mouth 2 (two) times daily.   . ONE TOUCH ULTRA TEST test strip USE TO TEST BLOOD GLUCOSE TWICE DAILY   . ONETOUCH DELICA LANCETS 40J MISC 2 strips by Does not apply route daily.    No facility-administered encounter medications on file as of 07/24/2020.     Current Diagnosis/Assessment:  Goals Addressed  This Visit's Progress   . Pharmacy Care Plan       Current Barriers:  . Chronic Disease Management support, education, and care coordination needs related to HTN, DMII, and History of PE, Cirrhosis, GERD, Elevated triglycerides, Osteoarthritis  Pharmacist Clinical Goal(s):  Marland Kitchen Maintain blood pressure within provider goal: (130/40mHg or 140/971mg)  . Maintain A1c <7.0% . Continue working on diet modifications (eating less fats, incorporating  vegetables, fruits, nuts, using salt substitutes). . Reduce GERD/ acid reflux symptoms.   Interventions: . Comprehensive medication review performed. . Discussed eating DASH diet:  following a diet emphasizing fruits and vegetables and low-fat dairy products along with whole grains, fish, poultry, and nuts. Reducing red meats and sugars.  . Discussed using a salt substitute to replace your salt if you need flavor.  . Discussed eating more green vegetables in order to lower blood sugars and limiting starchy vegetables (potatoes, corn, peas) . Discussed monitoring for signs and symptoms for bleeding (coughing up blood, prolonged nose bleeds, black, tarry stools) . Discussed non-pharmacological interventions for acid reflux. Take measures to prevent acid reflux, such as avoiding spicy foods, avoiding caffeine, avoid laying down a few hours after eating, and raising the head of the bed.  Patient Self Care Activities:  . Calls provider office for new concerns or questions . Continue current medications as directed by providers.  . Continue at home blood pressure readings. . Obtain diabetic eye exam every 1 to 2 years.  . Obtain at least once yearly foot exams. . Continue checking blood pressure at home.  Please see past updates related to this goal by clicking on the "Past Updates" button in the selected goal         Diabetes   Recent Relevant Labs: Lab Results  Component Value Date/Time   HGBA1C 6.8 06/16/2020 12:00 AM   HGBA1C 6.3 07/07/2019 09:20 AM   HGBA1C 6.1 04/23/2018 08:25 AM    Checking BG: Never   Recent FBG Readings: n/a  Patient has failed these meds in past: none  Patient is currently controlled on the following medications: diet.   Last diabetic Eye exam:  Lab Results  Component Value Date/Time   HMDIABEYEEXA No Retinopathy 04/06/2016 12:00 AM  - Patient mentions obtaining eye exam every year.   Last diabetic Foot exam:  Lab Results  Component Value  Date/Time   HMDIABFOOTEX Abstracted/Dr. DoMarily Memos3/15/2018 12:00 AM  - Recommended foot exam at least once a year.   We discussed: diet extensively.  - Patient working on limiting sugars, carbs, and refined and processed foods. -Diet: encouraged eating green vegetables; patient reports he does not eat many of them because he doesn't know how to cook them  Plan Continue control with diet.    Hypertension   BP today is:  <130/80  Office blood pressures are  BP Readings from Last 3 Encounters:  07/17/20 (!) 149/75  07/07/20 (!) 152/68  05/23/20 124/68   Patient has failed these meds in the past: none   Patient checks BP at home daily  Patient home BP readings are ranging: 147/75 HR 72, 144/58 HR 57, 160/69 HR 59, 140/66 HR 61, 122/61 HR 54, 142/77 HR 55, 141/70 HR 55, 153/73 HR 80  Patient is controlled on:  - amlodipine 1029m1 tablet daily - in PM - hydrochlorothiazide 59m59m tablet daily - in AM - losartan 100mg93mtablet daily - in AM  We discussed diet and exercise extensively  - DASH diet:  following a  diet emphasizing fruits and vegetables and low-fat dairy products along with whole grains, fish, poultry, and nuts. Reducing red meats and sugars.  - Reducing the amount of salt intake to <157m/per day.  - Recommend using a salt substitute to replace your salt if you need flavor. (patient using No Salt seasoning).  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   Will plan to discuss with PCP about closer follow up, starting new BP medication or switching times of administration to see the  impact on BP lowering.  History of PE  Patient has failed these meds in past: Warfarin  Patient is currently controlled on the following medications:  Eliquis 522m 1 tablet twice daily  We discussed:  monitoring for signs and symptoms for bleeding (coughing up blood, prolonged nose bleeds, black, tarry stools)  Plan Continue current medications.   GERD  Patient has failed these meds in past: ranitidine   Patient is currently controlled on the following medications:   Omeprazole 2078m1 capsule twice daily  - Patient reports still having acid reflux when eating his trigger foods or laying down immediately after eating.   We discussed: non-pharmacological interventions for acid reflux. Take measures to prevent acid reflux, such as avoiding spicy foods, avoiding caffeine, avoid laying down a few hours after eating, and raising the head of the bed.      Plan  Continue current medications.   Elevated triglycerides   Lipid Panel     Component Value Date/Time   CHOL 206 (A) 06/16/2020 0000   TRIG 92 06/16/2020 0000   HDL 83 (A) 06/16/2020 0000   CHOLHDL 3 07/07/2019 0920   VLDL 41.0 (H) 07/07/2019 0920   LDLCALC 105 06/16/2020 0000   LDLDIRECT 100.0 07/07/2019 0920     ASCVD 10-year risk: unable to calculate due to age.   Patient has failed these meds in past: none   Patient is currently uncontrolled on the following medications:  omega-3, 1 capsule twice daily   We discussed:  diet and exercise extensively. - Discussed cutting back on fats.   Plan Continue current medications and control with diet and exercise.    Osteoarthritis  Patient has failed these meds in past: none  Patient is currently managed on the following medications: Tylenol 500m66m tablets twice daily as needed for pain, CBD oil as needed  We discussed:  APAP maximum daily amount: 2000mg3mpatient has cirrhosis.  Plan Continue current medications.   Vaccines   Reviewed and discussed  patient's vaccination history.    Immunization History  Administered Date(s) Administered  . Fluad Quad(high Dose 65+) 07/07/2019, 07/07/2020  . Influenza,inj,Quad PF,6+ Mos 07/18/2017, 07/22/2018  . Pneumococcal Conjugate-13 10/21/2013  . Pneumococcal Polysaccharide-23 12/19/2016  . Td 08/02/2014  . Zoster 11/02/2012  . Zoster Recombinat (Shingrix) 12/1010-Dec-193610/2020   Patient reported getting Moderna on 10/27/19 &  11/24/19. Message sent to CMA to add to chart.  Plan  Patient is up to date on all immunizations.   Medication Management   [facility] (Eliquis, omeprazole, Areds 2) and [facility] for the rest  Uses pill box? Yes - 3 pill boxes (1 in AM, 1 in afternoon, 1 in PM) Pt endorses 90% compliance - maybe 1 or 2 missed doses  We discussed: Discussed benefits of medication synchronization, packaging and delivery as well as enhanced pharmacist oversight with [employer].  Plan  Continue current medication management strategy  Follow up: 3 month phone visit  Jeni Salles, PharmD Clinical Pharmacist [facility] [place] [phone]

## 2020-09-12 DIAGNOSIS — K7581 Nonalcoholic steatohepatitis (NASH): Secondary | ICD-10-CM | POA: Diagnosis not present

## 2020-09-12 DIAGNOSIS — I81 Portal vein thrombosis: Secondary | ICD-10-CM | POA: Diagnosis not present

## 2020-09-13 ENCOUNTER — Other Ambulatory Visit: Payer: Self-pay | Admitting: Nurse Practitioner

## 2020-09-13 DIAGNOSIS — K7460 Unspecified cirrhosis of liver: Secondary | ICD-10-CM

## 2020-09-19 DIAGNOSIS — J209 Acute bronchitis, unspecified: Secondary | ICD-10-CM | POA: Diagnosis not present

## 2020-09-20 ENCOUNTER — Encounter: Payer: Self-pay | Admitting: Adult Health

## 2020-09-20 ENCOUNTER — Other Ambulatory Visit: Payer: Self-pay

## 2020-09-20 ENCOUNTER — Ambulatory Visit (INDEPENDENT_AMBULATORY_CARE_PROVIDER_SITE_OTHER): Payer: PPO | Admitting: Adult Health

## 2020-09-20 VITALS — BP 132/78 | HR 95 | Temp 98.5°F | Resp 15 | Ht 67.0 in | Wt 195.2 lb

## 2020-09-20 DIAGNOSIS — J069 Acute upper respiratory infection, unspecified: Secondary | ICD-10-CM

## 2020-09-20 NOTE — Progress Notes (Signed)
Subjective:    Patient ID: Dakota Lewis, male    DOB: [birth year], 86 y.o.   MRN: [id number]  HPI  86 year old male who  has a past medical history of Bilateral pulmonary embolism ([place]), Chronic cough, DM (diabetes mellitus) ([place]), DVT (deep venous thrombosis) ([place]), Gastrointestinal bleed, H/O cardiovascular stress test (05/07/2011), H/O Doppler ultrasound (2013), H/O Doppler ultrasound (2012), H/O echocardiogram (03/14/2011), H/O exercise stress test (2006), Hiatal hernia, Hypertension, and Prostate cancer ([place]).  He presents to the office today for an acute issue.  His symptoms started 4 days ago sore throat, the next day progressed into runny nose, semiproductive cough, and nasal congestion.  Questionable chills as well.  Since that time his symptoms have gotten better and he feels the best today than he has since the beginning of the week.  He did have a virtual visit to his insurance company who started him on azithromycin yesterday.  Sore throat has resolved and the rest of his symptoms are improving  Denies fevers, shortness of breath chest pain, wheezing   Review of Systems See HPI   Past Medical History:  Diagnosis Date  . Bilateral pulmonary embolism (H[facility])    lovenox & coumadin thearpy  . Chronic cough   . DM (diabetes mellitus) ([place])   . DVT (deep venous thrombosis) ([facility])    left lower  . Gastrointestinal bleed    prior  . H/O cardiovascular stress test 05/07/2011   normal study, low risk scan  . H/O Doppler ultrasound 2013   venous duplex doppler  . H/O Doppler ultrasound 2012   venous duplex doppler  . H/O echocardiogram 03/14/2011   EF 65-70%  . H/O exercise stress test 2006   neg bruce protocol excercise stress test  . Hiatal hernia   . Hypertension   . Prostate cancer [facility])    s/p radiation    Social History   Socioeconomic History  . Marital status: Married    Spouse name: Not on file  . Number of children: 3  . Years of education: Not on file  .  Highest education level: Not on file  Occupational History  . Not on file  Tobacco Use  . Smoking status: Former Smoker    Packs/day: 0.75    Years: 20.00    Pack years: 15.00  . Smokeless tobacco: Never Used  Vaping Use  . Vaping Use: Never used  Substance and Sexual Activity  . Alcohol use: No  . Drug use: No  . Sexual activity: Not on file  Other Topics Concern  . Not on file  Social History Narrative  . Not on file   Social Determinants of Health   Financial Resource Strain: Not on file  Food Insecurity: Not on file  Transportation Needs: Not on file  Physical Activity: Not on file  Stress: Not on file  Social Connections: Not on file  Intimate Partner Violence: Not on file    Past Surgical History:  Procedure Laterality Date  . event monitor     2012  . HERNIA REPAIR Bilateral    1991, 1992  . HIATAL HERNIA REPAIR     1997  . IVC FILTER INSERTION    . PROSTATE SURGERY      Family History  Problem Relation Age of Onset  . Stroke Mother   . Kidney disease Father   . Heart disease Brother   . Arthritis Brother   . Heart disease Brother   . Prostate cancer Brother  Allergies  Allergen Reactions  . Lidocaine     ANTI ITCH CREAM, NO SPECIFICATIONS IN RECORDS.  Marland Kitchen Lisinopril     cough    Current Outpatient Medications on File Prior to Visit  Medication Sig Dispense Refill  . acetaminophen (TYLENOL) 500 MG tablet Take 1,000 mg by mouth 2 (two) times daily as needed (pain).    Marland Kitchen amLODipine (NORVASC) 10 MG tablet Take 1 tablet (10 mg total) by mouth daily. 90 tablet 3  . apixaban (ELIQUIS) 5 MG TABS tablet Take 5 mg by mouth 2 (two) times daily.    . Ascorbic Acid (VITAMIN C) 1000 MG tablet Take 1,000 mg by mouth daily. Patient reports,1 tablet twice daily    . azithromycin (ZITHROMAX) 250 MG tablet Take 250 mg by mouth daily.    . Blood Glucose Monitoring Suppl (ONE TOUCH ULTRA 2) w/Device KIT USE TO TEST BLOOD GLUCOSE TWICE DAILY 1 each 0  . Calcium  Carbonate (CALCIUM-CARB 600 PO) Take 2 tablets by mouth 2 (two) times daily.    . Cholecalciferol (D3-1000 PO) Take 1 capsule by mouth daily.     Marland Kitchen glucose blood test strip USE TO TEST BLOOD GLUCOSE TWICE DAILY 200 each 3  . guaiFENesin (MUCINEX) 600 MG 12 hr tablet Take by mouth 2 (two) times daily.    . hydrochlorothiazide (HYDRODIURIL) 25 MG tablet Take 1 tablet (25 mg total) by mouth daily. 90 tablet 3  . losartan (COZAAR) 100 MG tablet TAKE ONE TABLET (100 mg total) BY MOUTH DAILY 90 tablet 3  . Multiple Vitamins-Minerals (ICAPS AREDS 2 PO) Take 1 tablet by mouth 2 (two) times daily.    . Omega-3 Fatty Acids (FISH OIL PO) Take 1 capsule by mouth 2 (two) times daily.    Marland Kitchen omeprazole (PRILOSEC) 20 MG capsule Take 20 mg by mouth daily. 1 capsule twice daily    . ONE TOUCH ULTRA TEST test strip USE TO TEST BLOOD GLUCOSE TWICE DAILY 200 each 3  . ONETOUCH DELICA LANCETS 69I MISC 2 strips by Does not apply route daily. 200 each 3  . potassium chloride (MICRO-K) 10 MEQ CR capsule Take 1 capsule (10 mEq total) by mouth daily. 90 capsule 3   No current facility-administered medications on file prior to visit.    BP 132/78 (BP Location: Left Arm, Patient Position: Sitting, Cuff Size: Normal)   Pulse 95   Temp 98.5 F (36.9 C) (Oral)   Resp 15   Ht 5' 7"  (1.702 m)   Wt 195 lb 3.2 oz (88.5 kg)   SpO2 95%   BMI 30.57 kg/m       Objective:   Physical Exam Vitals and nursing note reviewed.  Constitutional:      Appearance: Normal appearance.  HENT:     Right Ear: Tympanic membrane, ear canal and external ear normal. There is no impacted cerumen.     Left Ear: Tympanic membrane, ear canal and external ear normal. There is no impacted cerumen.     Nose: Rhinorrhea present. No congestion.     Mouth/Throat:     Mouth: Mucous membranes are moist.     Pharynx: Oropharynx is clear.  Eyes:     Pupils: Pupils are equal, round, and reactive to light.  Cardiovascular:     Rate and Rhythm:  Normal rate and regular rhythm.     Pulses: Normal pulses.     Heart sounds: Normal heart sounds.  Pulmonary:     Effort: Pulmonary effort is normal.  Breath sounds: Normal breath sounds.  Abdominal:     General: Abdomen is flat.     Palpations: Abdomen is soft.  Musculoskeletal:        General: Normal range of motion.  Skin:    General: Skin is warm and dry.  Neurological:     General: No focal deficit present.     Mental Status: He is alert and oriented to person, place, and time.  Psychiatric:        Mood and Affect: Mood normal.        Behavior: Behavior normal.        Thought Content: Thought content normal.        Judgment: Judgment normal.       Assessment & Plan:  1. Upper respiratory tract infection, unspecified type -Likely viral but since he has started antibiotics we will have him finish his Z-Pak.  Glad he is improving, if not resolved in the next 3 or 4 days and follow-up. advised to rest and hydration until he is back to his normal self  [name], NP

## 2020-09-27 ENCOUNTER — Telehealth: Payer: Self-pay | Admitting: Adult Health

## 2020-09-27 NOTE — Telephone Encounter (Signed)
[name] called wanting Dakota Lewis nurse to call her and follow up on the patients last visit

## 2020-09-28 ENCOUNTER — Telehealth: Payer: Self-pay | Admitting: Adult Health

## 2020-09-28 DIAGNOSIS — J209 Acute bronchitis, unspecified: Secondary | ICD-10-CM | POA: Diagnosis not present

## 2020-09-28 DIAGNOSIS — Z1152 Encounter for screening for COVID-19: Secondary | ICD-10-CM | POA: Diagnosis not present

## 2020-09-28 DIAGNOSIS — J069 Acute upper respiratory infection, unspecified: Secondary | ICD-10-CM | POA: Diagnosis not present

## 2020-09-28 NOTE — Telephone Encounter (Signed)
Called daughter and left vm regarding patient

## 2020-09-29 DIAGNOSIS — R059 Cough, unspecified: Secondary | ICD-10-CM | POA: Diagnosis not present

## 2020-10-02 ENCOUNTER — Other Ambulatory Visit: Payer: PPO

## 2020-10-02 DIAGNOSIS — J209 Acute bronchitis, unspecified: Secondary | ICD-10-CM | POA: Diagnosis not present

## 2020-10-19 ENCOUNTER — Ambulatory Visit: Payer: PPO | Admitting: Vascular Surgery

## 2020-10-24 ENCOUNTER — Ambulatory Visit: Payer: PPO | Admitting: Pharmacist

## 2020-10-24 DIAGNOSIS — E782 Mixed hyperlipidemia: Secondary | ICD-10-CM

## 2020-10-24 DIAGNOSIS — I10 Essential (primary) hypertension: Secondary | ICD-10-CM

## 2020-10-24 NOTE — Chronic Care Management (AMB) (Signed)
Chronic Care Management Pharmacy  Name: Dakota Lewis  MRN: [id number] DOB: [birth year]  Initial Questions: 1. Have you seen any other providers since your last visit? Yes  2. Any changes in your medicines or health? No   Chief Complaint/ HPI  Dakota Lewis,  87 y.o. , male presents for their Follow-Up CCM visit with the clinical pharmacist via telephone.   PCP : [name], NP  Their chronic conditions include: HTN, history of PE, DM, Prostate Cancer, Cirrhosis, GERD, elevated triglycerides, Osteoarthritis  Office Visits: 09/20/20 [name], NP: Patient presented for upper respiratory tract infection. Plan to finish Z-pak.  07/07/20 [name], NP: Patient presented for chronic disease follow up. Influenza vaccine given during visit. Labs stable from [place] and did not redo. Continued current medications. Continued monitoring Q6 months for diabetes and still managed with diet. Follow up in 1 year.  05/23/20 [name], NP: Patient presented with chronic left shoulder pain follow up. Depo-medrol injection given. Referred to vascular surgery for varicose veins evaluation.  02/08/20  [name], NP: Patient presented with a facial rash. Ruled out shingles. Prescribed Kenalog cream BID for 7-10 days for follow up with dermatology.  Consult Visit: 09/12/20 Roosevelt Locks, NP (transplant): Patient presented for NASH follow up.  07/17/20 Bing Ree, PA-C (vascular & vein): Patient presented for an initial evaluation of varicose veins. BP was slightly elevated in office. Recommended elevation, compression and follow up. Follow up in 3 months.  09/08/2019- Gastroenterology- Patient presented to Dr. Laren Everts.   08/17/2019- Optometry- Patient presented to [facility] for headache.   06/21/2019- Patient presented to Dr. Franchot Gallo for malignant neoplasm.   Medications: Outpatient Encounter Medications as of 10/24/2020  Medication Sig Note  . Ascorbic Acid (VITAMIN  C) 1000 MG tablet Take 1,000 mg by mouth daily. Patient reports,1 tablet twice daily   . hydrochlorothiazide (HYDRODIURIL) 25 MG tablet Take 1 tablet (25 mg total) by mouth daily.   Marland Kitchen losartan (COZAAR) 100 MG tablet TAKE ONE TABLET (100 mg total) BY MOUTH DAILY   . Multiple Vitamins-Minerals (ICAPS AREDS 2 PO) Take 1 tablet by mouth 2 (two) times daily.   . Omega-3 Fatty Acids (FISH OIL PO) Take 1 capsule by mouth 2 (two) times daily.   Marland Kitchen omeprazole (PRILOSEC) 20 MG capsule Take 20 mg by mouth daily. 1 capsule twice daily 11/25/2019: Patient reports taking 1 capsule twice daily  . potassium chloride (MICRO-K) 10 MEQ CR capsule Take 1 capsule (10 mEq total) by mouth daily.   Marland Kitchen acetaminophen (TYLENOL) 500 MG tablet Take 1,000 mg by mouth 2 (two) times daily as needed (pain).   Marland Kitchen amLODipine (NORVASC) 10 MG tablet Take 1 tablet (10 mg total) by mouth daily.   Marland Kitchen apixaban (ELIQUIS) 5 MG TABS tablet Take 5 mg by mouth 2 (two) times daily.   . Blood Glucose Monitoring Suppl (ONE TOUCH ULTRA 2) w/Device KIT USE TO TEST BLOOD GLUCOSE TWICE DAILY   . Calcium Carbonate (CALCIUM-CARB 600 PO) Take 2 tablets by mouth 2 (two) times daily.   . Cholecalciferol (D3-1000 PO) Take 1 capsule by mouth daily.  11/25/2019: Patient reports taking 2 tablets in the morning.   Marland Kitchen glucose blood test strip USE TO TEST BLOOD GLUCOSE TWICE DAILY   . guaiFENesin (MUCINEX) 600 MG 12 hr tablet Take by mouth 2 (two) times daily.   . ONE TOUCH ULTRA TEST test strip USE TO TEST BLOOD GLUCOSE TWICE DAILY   . ONETOUCH DELICA LANCETS 16P  MISC 2 strips by Does not apply route daily.   . [DISCONTINUED] azithromycin (ZITHROMAX) 250 MG tablet Take 250 mg by mouth daily.    No facility-administered encounter medications on file as of 10/24/2020.   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   Current Diagnosis/Assessment:  Goals Addressed            This Visit's Progress    . Pharmacy Care Plan       Current Barriers:  . Chronic Disease Management support, education, and care coordination needs related to HTN, DMII, and History of PE, Cirrhosis, GERD, Elevated triglycerides, Osteoarthritis  Pharmacist Clinical Goal(s):  Marland Kitchen Maintain blood pressure within provider goal: (130/39mHg or 140/969mg)  . Maintain A1c <7.0% . Continue working on diet modifications (eating less fats, incorporating vegetables, fruits, nuts, using salt substitutes). . Reduce GERD/ acid reflux symptoms.   Interventions: . Comprehensive medication review performed. . Discussed eating DASH diet:  following a diet emphasizing fruits and vegetables and low-fat dairy products along with whole grains, fish, poultry, and nuts. Reducing red meats and sugars.  . Discussed using a salt substitute to replace your salt if you need flavor.  . Discussed eating more green vegetables in order to lower blood sugars and limiting starchy vegetables (potatoes, corn, peas) . Discussed monitoring for signs and symptoms for bleeding (coughing up blood, prolonged nose bleeds, black, tarry stools) . Discussed non-pharmacological interventions for acid reflux. Take measures to prevent acid reflux, such as avoiding spicy foods, avoiding caffeine, avoid laying down a few hours after eating, and raising the head of the bed. . Recommended stopping fish oil supplementation and decreasing calcium to 2 tablets daily to avoid over supplementation.  Patient Self Care Activities:  . Calls provider office for new concerns or questions . Continue current medications as directed by providers.  . Continue at home blood pressure readings. . Obtain diabetic eye exam every 1 to 2 years.  . Obtain at least once yearly foot exams. . Continue checking blood pressure at home.  Please see past updates related to this goal by clicking on the "Past Updates" button in the selected goal         Diabetes   Recent Relevant  Labs: Lab Results  Component Value Date/Time   HGBA1C 6.8 06/16/2020 12:00 AM   HGBA1C 6.3 07/07/2019 09:20 AM   HGBA1C 6.1 04/23/2018 08:25 AM    Checking BG: Never   Recent FBG Readings: n/a  Patient has failed these meds in past: none  Patient is currently controlled on the following medications: diet.   Last diabetic Eye exam:  Lab Results  Component Value Date/Time   HMDIABEYEEXA No Retinopathy 04/06/2016 12:00 AM  - Patient mentions obtaining eye exam every year.   Last diabetic Foot exam:  Lab Results  Component Value Date/Time   HMDIABFOOTEX Abstracted/Dr. DoMarily Memos3/15/2018 12:00 AM  - Recommended foot exam at least once a year.   We discussed: diet extensively.  - Patient working on limiting sugars, carbs, and refined and processed foods. -Diet: encouraged eating green vegetables; patient reports he does not eat many of them because he doesn't know how to cook them  Plan Continue control with diet.    Hypertension   BP today is:  <130/80  Office blood pressures are  BP Readings from Last 3 Encounters:  09/20/20 132/78  07/17/20 (!) 149/75  07/07/20 (!) 152/68  Patient has failed these meds in the past: none   Patient checks BP at home infrequently  Patient home BP readings are ranging: 132/69 HR 81, usually 140-150s/60-70s   Patient is controlled on:  - amlodipine 35m, 1 tablet daily - in PM - hydrochlorothiazide 273m 1 tablet daily - in AM - losartan 10074m1 tablet daily - in AM  We discussed diet and exercise extensively  - DASH diet:  following a diet emphasizing fruits and vegetables and low-fat dairy products along with whole grains, fish, poultry, and nuts. Reducing red meats and sugars.  - Reducing the amount of salt intake to <1500m80mr day.  - Recommend using a salt substitute to replace your salt if you need flavor. (patient using No Salt seasoning).  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   Will send message to PCP to combine losartan and HCTZ to cut down on pill burden.  History of PE  Patient has failed these meds in past: Warfarin  Patient is currently controlled on the following medications:  -Eliquis 5mg,37mtablet twice daily  We discussed:  monitoring for signs and symptoms for bleeding (coughing up blood, prolonged nose bleeds, black, tarry stools)  Plan Continue current medications.   GERD  Patient has failed these meds in past: ranitidine   Patient is currently controlled on the following medications:   Omeprazole 20mg,8mapsule twice daily    We discussed: non-pharmacological interventions for acid reflux. Take measures to prevent acid reflux, such as avoiding spicy foods, avoiding caffeine, avoid laying down a few hours after eating, and raising the head of the bed.    ; patient denies any symptoms of heartburn but has had 3 hernia operations in the past and an ulcer but was interested in stopping the medication if possible   Plan  Continue current medications.  Will discuss with PCP about possibly tapering PPI.  Elevated triglycerides   Lipid Panel     Component Value Date/Time   CHOL 206 (A) 06/16/2020 0000   TRIG 92 06/16/2020 0000   HDL 83 (A) 06/16/2020 0000   CHOLHDL 3 07/07/2019 0920   VLDL 41.0 (H) 07/07/2019 0920   LDLCALC 105 06/16/2020 0000   LDLDIRECT 100.0 07/07/2019 0920     ASCVD 10-year risk: unable to calculate due to age.   Patient has failed these meds in past: none   Patient is currently uncontrolled on the following medications:  omega-3 1000 mg, 1 capsule twice daily    We discussed:  diet and exercise extensively. - Discussed cutting back on fats.   Plan Continue current medications and control with diet and exercise.  Recommended stopping fish oil supplementation as current dose is not in therapeutic range of 2- 4g/day.  Osteoarthritis  Patient has failed these meds in past: none  Patient is currently managed on the following medications: Tylenol 500mg, 14mblets twice daily as needed for pain, CBD oil as needed  We discussed:  APAP maximum daily amount: 2000mg as58mient has cirrhosis.  Plan Continue current medications.   Vitamins   Last vitamin D Lab Results  Component Value Date   VD25OH 58.53 05/25/2018   Patient is currently on the following medications:  Marland Kitchen Vitamin C 1000 mg 1 tablet twice daily  . Calcium 600 mg 2 tablets twice a daily . Vitamin D 5000 units - 2 tablets daily  We discussed:  recommended two tablets daily of calcium  Plan Recommended taking calcium once daily as he is over recommended dose of 1200 mg/day. Recommend repeat vitamin D level. Continue current medications   Vaccines   Reviewed and discussed patient's vaccination history.    Immunization History  Administered Date(s) Administered  . Fluad Quad(high Dose 65+) 07/07/2019, 07/07/2020  . Influenza,inj,Quad PF,6+ Mos 07/18/2017, 07/22/2018  . Moderna SARS-COV2 Booster Vaccination 09/08/2020  . Moderna Sars-Covid-2 Vaccination 10/27/2019, 11/24/2019  . Pneumococcal Conjugate-13 10/21/2013  . Pneumococcal Polysaccharide-23 12/19/2016  . Td 08/02/2014  . Zoster 11/02/2012  . Zoster Recombinat (Shingrix) 07/17/2019, 12/11/2019    Plan  Patient is up to date on all immunizations.   Medication Management   Pt uses [facility] for select medications (Eliquis, omeprazole, Areds 2) and [facility] for the rest Uses pill box? Yes - 3 pill boxes (1 in AM, 1 in afternoon, 1 in PM) Pt endorses 90% compliance - maybe 1 or 2 missed doses  We  discussed: Discussed benefits of medication synchronization, packaging and delivery as well as enhanced pharmacist oversight with [employer].  Plan  Continue current medication management strategy  Follow up: 4 month phone visit  Jeni Salles, PharmD Clinical Pharmacist [facility] [phone]

## 2020-11-06 NOTE — Patient Instructions (Signed)
Visit Information  Goals Addressed            This Visit's Progress   . Pharmacy Care Plan       Current Barriers:  . Chronic Disease Management support, education, and care coordination needs related to HTN, DMII, and History of PE, Cirrhosis, GERD, Elevated triglycerides, Osteoarthritis  Pharmacist Clinical Goal(s):  Marland Kitchen Maintain blood pressure within provider goal: (130/9mHg or 140/996mg)  . Maintain A1c <7.0% . Continue working on diet modifications (eating less fats, incorporating vegetables, fruits, nuts, using salt substitutes). . Reduce GERD/ acid reflux symptoms.   Interventions: . Comprehensive medication review performed. . Discussed eating DASH diet:  following a diet emphasizing fruits and vegetables and low-fat dairy products along with whole grains, fish, poultry, and nuts. Reducing red meats and sugars.  . Discussed using a salt substitute to replace your salt if you need flavor.  . Discussed eating more green vegetables in order to lower blood sugars and limiting starchy vegetables (potatoes, corn, peas) . Discussed monitoring for signs and symptoms for bleeding (coughing up blood, prolonged nose bleeds, black, tarry stools) . Discussed non-pharmacological interventions for acid reflux. Take measures to prevent acid reflux, such as avoiding spicy foods, avoiding caffeine, avoid laying down a few hours after eating, and raising the head of the bed. . Recommended stopping fish oil supplementation and decreasing calcium to 2 tablets daily to avoid over supplementation.  Patient Self Care Activities:  . Calls provider office for new concerns or questions . Continue current medications as directed by providers.  . Continue at home blood pressure readings. . Obtain diabetic eye exam every 1 to 2 years.  . Obtain at least once yearly foot exams. . Continue checking blood pressure at home.  Please see past updates related to this goal by clicking on the "Past Updates"  button in the selected goal         The patient verbalized understanding of instructions, educational materials, and care plan provided today and declined offer to receive copy of patient instructions, educational materials, and care plan.   Telephone follow up appointment with pharmacy team member scheduled for: 4 months  MaViona GilmoreRPMaryland Diagnostic And Therapeutic [facility] LLC

## 2020-11-21 ENCOUNTER — Telehealth: Payer: Self-pay | Admitting: Adult Health

## 2020-11-21 NOTE — Telephone Encounter (Signed)
pts daughter is calling in stating at this present time the pt is not interested in this service.

## 2020-11-21 NOTE — Telephone Encounter (Signed)
Left message for patient to call back and schedule Medicare Annual Wellness Visit (AWV) either virtually or in office. No detailed message left    Last AWVI please schedule at anytime with [facility] Nurse Health Advisor 1 or 2   This should be a 45 minute visit.

## 2020-11-27 ENCOUNTER — Ambulatory Visit
Admission: RE | Admit: 2020-11-27 | Discharge: 2020-11-27 | Disposition: A | Discharge status: Home / Self Care | Payer: PPO | Source: Ambulatory Visit | Attending: Nurse Practitioner | Admitting: Nurse Practitioner

## 2020-11-27 DIAGNOSIS — K7460 Unspecified cirrhosis of liver: Secondary | ICD-10-CM

## 2020-12-04 ENCOUNTER — Telehealth: Payer: Self-pay | Admitting: Pharmacist

## 2020-12-04 NOTE — Chronic Care Management (AMB) (Signed)
    Chronic Care Management Pharmacy Assistant   Name: Dakota Lewis  MRN: [id number] DOB: March 20, 1933  Reason for Encounter: General Adherence Call  Patient Questions:  1.  Have you seen any other providers since your last visit? No  2.  Any changes in your medicines or health? No  PCP : [name], NP  Allergies:   Allergies  Allergen Reactions  . Lidocaine     ANTI ITCH CREAM, NO SPECIFICATIONS IN RECORDS.  Marland Kitchen Lisinopril     cough    Medications: Outpatient Encounter Medications as of 12/04/2020  Medication Sig Note  . acetaminophen (TYLENOL) 500 MG tablet Take 1,000 mg by mouth 2 (two) times daily as needed (pain).   Marland Kitchen amLODipine (NORVASC) 10 MG tablet Take 1 tablet (10 mg total) by mouth daily.   Marland Kitchen apixaban (ELIQUIS) 5 MG TABS tablet Take 5 mg by mouth 2 (two) times daily.   . Ascorbic Acid (VITAMIN C) 1000 MG tablet Take 1,000 mg by mouth daily. Patient reports,1 tablet twice daily   . Blood Glucose Monitoring Suppl (ONE TOUCH ULTRA 2) w/Device KIT USE TO TEST BLOOD GLUCOSE TWICE DAILY   . Calcium Carbonate (CALCIUM-CARB 600 PO) Take 2 tablets by mouth 2 (two) times daily.   . Cholecalciferol (D3-1000 PO) Take 1 capsule by mouth daily.  11/25/2019: Patient reports taking 2 tablets in the morning.   Marland Kitchen glucose blood test strip USE TO TEST BLOOD GLUCOSE TWICE DAILY   . guaiFENesin (MUCINEX) 600 MG 12 hr tablet Take by mouth 2 (two) times daily.   . hydrochlorothiazide (HYDRODIURIL) 25 MG tablet Take 1 tablet (25 mg total) by mouth daily.   Marland Kitchen losartan (COZAAR) 100 MG tablet TAKE ONE TABLET (100 mg total) BY MOUTH DAILY   . Multiple Vitamins-Minerals (ICAPS AREDS 2 PO) Take 1 tablet by mouth 2 (two) times daily.   . Omega-3 Fatty Acids (FISH OIL PO) Take 1 capsule by mouth 2 (two) times daily.   Marland Kitchen omeprazole (PRILOSEC) 20 MG capsule Take 20 mg by mouth daily. 1 capsule twice daily 11/25/2019: Patient reports taking 1 capsule twice daily  . ONE TOUCH ULTRA TEST test strip USE TO  TEST BLOOD GLUCOSE TWICE DAILY   . ONETOUCH DELICA LANCETS 36U MISC 2 strips by Does not apply route daily.   . potassium chloride (MICRO-K) 10 MEQ CR capsule Take 1 capsule (10 mEq total) by mouth daily.    No facility-administered encounter medications on file as of 12/04/2020.    Current Diagnosis: Patient Active Problem List   Diagnosis Date Noted  . Diet-controlled diabetes mellitus ([place]) 12/30/2017  . Atypical chest pain 11/28/2017  . Pulmonary embolism ([place]) 07/09/2017  . Prostate cancer ([place])   . Hypertension     Goals Addressed   None     Follow-Up:  Pharmacist Review  I spoke with the patient's daughter Dakota Lewis who is her caregiver (HIPPA) and discussed medication adherence with the patient, no issues at this time with current medication. She states that he is taking his Omeprazole twice daily alternating once daily. When he experiences heartburn, he is taking tums and that is helping. There have been no other changes to his medications. The patient denies any side effects with his medication. Also, denies any problems with his current pharmacy.  Maia Breslow, [place] Assistant [phone]

## 2021-02-21 ENCOUNTER — Telehealth: Payer: Self-pay | Admitting: Pharmacist

## 2021-02-21 NOTE — Chronic Care Management (AMB) (Signed)
    Chronic Care Management Pharmacy Assistant   Name: JAISHAWN WITZKE  MRN: [id number] DOB: [birth year]  Spoke with patients daughter whose number is listed as primary in chart and reminded her of patients appointment on 02/22/21 at 1pm by phone. Reminded to make sure patient has all medications available and any blood pressure or blood glucose readings available for review. Daughter will remind patient.   [place] [phone]

## 2021-02-22 ENCOUNTER — Ambulatory Visit (INDEPENDENT_AMBULATORY_CARE_PROVIDER_SITE_OTHER): Payer: PPO | Admitting: Pharmacist

## 2021-02-22 DIAGNOSIS — E782 Mixed hyperlipidemia: Secondary | ICD-10-CM | POA: Diagnosis not present

## 2021-02-22 DIAGNOSIS — I10 Essential (primary) hypertension: Secondary | ICD-10-CM

## 2021-02-22 NOTE — Progress Notes (Signed)
Chronic Care Management Pharmacy Note  03/06/2021 Name:  Dakota Lewis MRN:  [id number] DOB:  12-10-1932  Subjective: Dakota Lewis is an 87 y.o. year old male who is a primary patient of Nafziger, Tommi Rumps, NP.  The CCM team was consulted for assistance with disease management and care coordination needs.    Engaged with patient by telephone for follow up visit in response to provider referral for pharmacy case management and/or care coordination services.   Consent to Services:  The patient was given information about Chronic Care Management services, agreed to services, and gave verbal consent prior to initiation of services.  Please see initial visit note for detailed documentation.   Patient Care Team: [name], NP as PCP - General (Family Medicine) Franchot Gallo, MD as Consulting Physician (Urology) Viona Gilmore, [facility] as Pharmacist (Pharmacist)  Recent office visits: 09/20/20 [name], NP: Patient presented for upper respiratory tract infection. Plan to finish Z-pak.  07/07/20 [name], NP: Patient presented for chronic disease follow up. Influenza vaccine given during visit. Labs stable from [place] and did not redo. Continued current medications. Continued monitoring Q6 months for diabetes and still managed with diet. Follow up in 1 year.  Recent consult visits: 11/01/20 Noelle Penner [facility]): Patient presented for follow up. Unable to access notes.  09/12/20 Roosevelt Locks, NP (transplant): Patient presented for NASH follow up.  07/17/20 Bing Ree, PA-C (vascular & vein): Patient presented for an initial evaluation of varicose veins. BP was slightly elevated in office. Recommended elevation, compression and follow up. Follow up in 3 months.  Hospital visits: None in previous 6 months  Objective:  Lab Results  Component Value Date   CREATININE 1.0 06/16/2020   BUN 32 (A) 06/16/2020   GFR 68.71 07/07/2019   GFRNONAA >60 06/16/2020   GFRAA  03/15/2011    >60         The eGFR has been calculated using the MDRD equation. This calculation has not been validated in all clinical situations. eGFR's persistently <60 mL/min signify possible Chronic Kidney Disease.   NA 135 (A) 06/16/2020   K 4.8 06/16/2020   CALCIUM 10.2 07/07/2019   CO2 30 (A) 06/16/2020   GLUCOSE 123 (H) 07/07/2019    Lab Results  Component Value Date/Time   HGBA1C 6.8 06/16/2020 12:00 AM   HGBA1C 6.3 07/07/2019 09:20 AM   HGBA1C 6.1 04/23/2018 08:25 AM   GFR 68.71 07/07/2019 09:20 AM   GFR 67.18 04/23/2018 08:25 AM    Last diabetic Eye exam:  Lab Results  Component Value Date/Time   HMDIABEYEEXA No Retinopathy 04/06/2016 12:00 AM    Last diabetic Foot exam:  Lab Results  Component Value Date/Time   HMDIABFOOTEX Abstracted/Dr. Marily Memos 12/19/2016 12:00 AM     Lab Results  Component Value Date   CHOL 206 (A) 06/16/2020   HDL 83 (A) 06/16/2020   LDLCALC 105 06/16/2020   LDLDIRECT 100.0 07/07/2019   TRIG 92 06/16/2020   CHOLHDL 3 07/07/2019    Hepatic Function Latest Ref Rng & Units 06/16/2020 07/07/2019 04/23/2018  Total Protein 6.0 - 8.3 g/dL - 7.2 7.3  Albumin 3.5 - 5.0 3.9 4.4 4.3  AST 14 - 40 24 18 24   ALT 10 - 40 49(A) 25 33  Alk Phosphatase 39 - 117 U/L - 76 76  Total Bilirubin 0.2 - 1.2 mg/dL - 0.7 0.4  Bilirubin, Direct 0.01 - 0.4 0.2 - 0.1    Lab Results  Component Value Date/Time  TSH 1.21 06/16/2020 12:00 AM   TSH 1.15 07/07/2019 09:20 AM    CBC Latest Ref Rng & Units 06/16/2020 09/24/2019 07/07/2019  WBC - 6.7 6.4 6.2  Hemoglobin 13.5 - 17.5 14.1 13.3 13.3  Hematocrit 41 - 53 42 39.9 39.4  Platelets 150 - 399 196 191.0 183.0    Lab Results  Component Value Date/Time   VD25OH 58.53 05/25/2018 08:24 AM   VD25OH 48.5 03/28/2017 12:00 AM    Clinical ASCVD: No  The ASCVD Risk score Mikey Bussing DC Jr., et al., 2013) failed to calculate for the following reasons:   The 2013 ASCVD risk score is only valid for ages 16 to 15     Depression screen PHQ 2/9 07/07/2020 07/07/2019 04/23/2018  Decreased Interest 0 0 0  Down, Depressed, Hopeless 0 0 0  PHQ - 2 Score 0 0 0     Social History   Tobacco Use  Smoking Status Former Smoker  . Packs/day: 0.75  . Years: 20.00  . Pack years: 15.00  Smokeless Tobacco Never Used   BP Readings from Last 3 Encounters:  09/20/20 132/78  07/17/20 (!) 149/75  07/07/20 (!) 152/68   Pulse Readings from Last 3 Encounters:  09/20/20 95  07/17/20 (!) 51  07/07/20 60   Wt Readings from Last 3 Encounters:  09/20/20 195 lb 3.2 oz (88.5 kg)  07/17/20 193 lb 11.2 oz (87.9 kg)  07/07/20 198 lb 3 oz (89.9 kg)   BMI Readings from Last 3 Encounters:  09/20/20 30.57 kg/m  07/17/20 30.34 kg/m  07/07/20 31.04 kg/m    Assessment/Interventions: Review of patient past medical history, allergies, medications, health status, including review of consultants reports, laboratory and other test data, was performed as part of comprehensive evaluation and provision of chronic care management services.   SDOH:  (Social Determinants of Health) assessments and interventions performed: No  SDOH Screenings   Alcohol Screen: Not on file  Depression (PHQ2-9): Low Risk   . PHQ-2 Score: 0  Financial Resource Strain: Not on file  Food Insecurity: Not on file  Housing: Not on file  Physical Activity: Not on file  Social Connections: Not on file  Stress: Not on file  Tobacco Use: Medium Risk  . Smoking Tobacco Use: Former Smoker  . Smokeless Tobacco Use: Never Used  Transportation Needs: Not on file    CCM Care Plan  Allergies  Allergen Reactions  . Lidocaine     ANTI ITCH CREAM, NO SPECIFICATIONS IN RECORDS.  Marland Kitchen Lisinopril     cough    Medications Reviewed Today    Reviewed by Adair Laundry, [place] (Certified Medical Assistant) on 09/20/20 at [place] List Status: <None>  Medication Order Taking? Sig Documenting Provider Last Dose Status Informant  acetaminophen (TYLENOL) 500  MG tablet [id number] Yes Take 1,000 mg by mouth 2 (two) times daily as needed (pain). [provider] Taking Active Self  amLODipine (NORVASC) 10 MG tablet [id number] Yes Take 1 tablet (10 mg total) by mouth daily. Nafziger, Tommi Rumps, NP Taking Active   apixaban (ELIQUIS) 5 MG TABS tablet [id number] Yes Take 5 mg by mouth 2 (two) times daily. [provider] Taking Active   Ascorbic Acid (VITAMIN C) 1000 MG tablet [id number] Yes Take 1,000 mg by mouth daily. Patient reports,1 tablet twice daily [provider] Taking Active Self  azithromycin (ZITHROMAX) 250 MG tablet [id number] Yes Take 250 mg by mouth daily. [provider] Taking Active   Blood Glucose Monitoring Suppl (ONE [facility]  ULTRA 2) w/Device KIT [id number] Yes USE TO TEST BLOOD GLUCOSE TWICE DAILY Nafziger, Tommi Rumps, NP Taking Active   Calcium Carbonate (CALCIUM-CARB 600 PO) [id number] Yes Take 2 tablets by mouth 2 (two) times daily. [provider] Taking Active   Cholecalciferol (D3-1000 PO) [id number] Yes Take 1 capsule by mouth daily.  [provider] Taking Active Self           Med Note Glennis Brink, ANNETTE   Thu Nov 25, 2019 10:59 AM) Patient reports taking 2 tablets in the morning.   glucose blood test strip [id number] Yes USE TO TEST BLOOD GLUCOSE TWICE DAILY Nafziger, Tommi Rumps, NP Taking Active   guaiFENesin (MUCINEX) 600 MG 12 hr tablet [id number] Yes Take by mouth 2 (two) times daily. [provider] Taking Active   hydrochlorothiazide (HYDRODIURIL) 25 MG tablet [id number] Yes Take 1 tablet (25 mg total) by mouth daily. Nafziger, Tommi Rumps, NP Taking Active   losartan (COZAAR) 100 MG tablet [id number] Yes TAKE ONE TABLET (100 mg total) BY MOUTH DAILY Nafziger, Tommi Rumps, NP Taking Active   Multiple Vitamins-Minerals (ICAPS AREDS 2 PO) [id number] Yes Take 1 tablet by mouth 2 (two) times daily. [provider] Taking Active Self  Omega-3 Fatty Acids (FISH OIL PO) [id number] Yes Take 1 capsule by  mouth 2 (two) times daily. [provider] Taking Active   omeprazole (PRILOSEC) 20 MG capsule [id number] Yes Take 20 mg by mouth daily. 1 capsule twice daily [provider] Taking Active Self           Med Note Rudean Haskell Nov 25, 2019 10:38 AM) Patient reports taking 1 capsule twice daily  ONE TOUCH ULTRA TEST test strip [id number] Yes USE TO TEST BLOOD GLUCOSE TWICE DAILY Nafziger, Tommi Rumps, NP Taking Active   [facility] DELICA LANCETS 07M [place] [id number] Yes 2 strips by Does not apply route daily. Nafziger, Tommi Rumps, NP Taking Active   potassium chloride (MICRO-K) 10 MEQ CR capsule [id number] Yes Take 1 capsule (10 mEq total) by mouth daily. [name], NP Taking Active           Patient Active Problem List   Diagnosis Date Noted  . Diet-controlled diabetes mellitus ([place]) 12/30/2017  . Atypical chest pain 11/28/2017  . Pulmonary embolism ([place]) 07/09/2017  . Prostate cancer ([place])   . Hypertension     Immunization History  Administered Date(s) Administered  . Fluad Quad(high Dose 65+) 07/07/2019, 07/07/2020  . Influenza,inj,Quad PF,6+ Mos 07/18/2017, 07/22/2018  . Moderna SARS-COV2 Booster Vaccination 09/08/2020  . Moderna Sars-Covid-2 Vaccination 10/27/2019, 11/24/2019  . Pneumococcal Conjugate-13 10/21/2013  . Pneumococcal Polysaccharide-23 12/19/2016  . Td 08/02/2014  . Zoster Recombinat (Shingrix) 07/17/2019, 12/11/2019  . Zoster, Live 11/02/2012    Conditions to be addressed/monitored:  Hypertension, Hyperlipidemia, Diabetes, GERD, Osteoarthritis and History of PE, prostate cancer, cirrhosis  Care Plan : CCM Pharmacy Care Plan  Updates made by Viona Gilmore, [place] since 03/06/2021 12:00 AM    Problem: Problem: Hypertension, Hyperlipidemia, Diabetes, GERD, Osteoarthritis and History of PE, prostate cancer, cirrhosis     Long-Range Goal: Patient-Specific Goal   Start Date: 02/21/2021  Expected End Date: 02/21/2022  This Visit's Progress: On  track  Priority: High  Note:   Current Barriers:  . Unable to independently monitor therapeutic efficacy  Pharmacist Clinical Goal(s):  Marland Kitchen Patient will achieve adherence to monitoring guidelines and medication adherence to achieve therapeutic efficacy through collaboration with PharmD and provider.   Interventions: . 1:1 collaboration with [name], NP regarding development  and update of comprehensive plan of care as evidenced by provider attestation and co-signature . Inter-disciplinary care team collaboration (see longitudinal plan of care) . Comprehensive medication review performed; medication list updated in electronic medical record  Hypertension (BP goal <140/90) -Not ideally controlled -Current treatment:  amlodipine 45m, 1 tablet daily - in PM  hydrochlorothiazide 275m 1 tablet daily - in AM  losartan 10052m1 tablet daily - in AM -Medications previously tried: none  -Current home readings: 165/71, 132/64, 134/67 (HR 57) 155 BG, 152/68 (HR 155) -Current dietary habits: patient doesn't eat out often and does not eat frozen foods; his breakfast consists of country ham, 2 eggs and 1 biscuit and 4 cups of coffee -Current exercise habits: 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 Will hold off on combo BP medication until then.  High triglycerides: (TG goal < 150) -Controlled -Current treatment: . 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 No medications -Medications previously tried: none  -Current home glucose readings . fasting glucose: does not check at home . post prandial glucose: does not check at home -Denies hypoglycemic/hyperglycemic symptoms -Current meal patterns:  . breakfast: did not discuss  . lunch: did not discuss   . dinner: did not discuss  . snacks: did not discuss  . drinks: did not discuss  -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 -Educated on A1c and blood sugar goals; Carbohydrate counting and/or plate method -Counseled to check feet daily and get yearly eye exams -Counseled on diet and exercise extensively  History of PE (Goal: prevent blood clots) -Controlled -Current treatment   Eliquis 5mg54m tablet twice daily -Medications previously tried: warfarin -Recommended to continue current medication  GERD (Goal: minimize symptoms of heartburn/acid reflux) -Controlled -Current treatment  . Omeprazole 20mg48mcapsule twice daily alternating with one capsule daily -Medications previously tried: none  -Counseled on non-pharmacologic management of symptoms such as elevating the head of your bed, avoiding eating 2-3 hours before bed, avoiding triggering foods such as acidic, spicy, or fatty foods, eating smaller meals, and wearing clothes that are loose around the waist Recommended decreasing to omeprazole 1 capsule in the evening  Osteoarthritis (Goal: minimize pain) -Controlled -Current treatment   Tylenol 500mg,85mablets twice daily as needed for pain  CBD oil as needed -Medications previously tried: none  -Recommended to continue current medication Counseled on maximum daily amount of Tylenol of 2000mg a87matient has cirrhosis.  Health Maintenance -Vaccine gaps: none -Current therapy:   Vitamin C 1000 mg 1 tablet once daily   Calcium 600 mg 1 tablet twice a daily - vitamin 20 mcg (just calcium)  Vitamin D 5000 units - 2 tablets daily -Educated on Herbal supplement research is limited and benefits usually cannot be proven Cost vs benefit of each product must be carefully weighed by individual consumer Supplements may  interfere with prescription drugs -Patient is satisfied with current therapy and denies issues -Recommended to continue current medication  Patient Goals/Self-Care Activities . Patient will:  - take medications as prescribed check blood pressure weekly, document, and provide at future appointments target a minimum of 150 minutes of moderate intensity exercise weekly  Follow Up Plan: Telephone follow up appointment with care management team member scheduled for: 4 months       Medication Assistance: None required.  Patient affirms current coverage meets needs.  Patient's preferred pharmacy is:  [facility], [place] [address] Phone: [phone] Fax: [phone]  [facility]) - [place], [place] [address] Phone: [phone] Fax: [phone]  Uses pill box? Yes - 3 pill boxes (1 in AM, 1 in afternoon, 1 in PM) Pt endorses 99% compliance  We discussed: Current pharmacy is preferred with insurance plan and patient is satisfied with pharmacy services Patient decided to: Continue current medication management strategy  Care Plan and Follow Up Patient Decision:  Patient agrees to Care Plan and Follow-up.  Plan: Telephone follow up appointment with care management team member scheduled for:  4 months  Jeni Salles, PharmD [place] Pharmacist [place] at [place] [phone]

## 2021-02-23 DIAGNOSIS — I10 Essential (primary) hypertension: Secondary | ICD-10-CM | POA: Diagnosis not present

## 2021-02-23 DIAGNOSIS — H353190 Nonexudative age-related macular degeneration, unspecified eye, stage unspecified: Secondary | ICD-10-CM | POA: Diagnosis not present

## 2021-02-23 DIAGNOSIS — K219 Gastro-esophageal reflux disease without esophagitis: Secondary | ICD-10-CM | POA: Diagnosis not present

## 2021-02-23 DIAGNOSIS — I82509 Chronic embolism and thrombosis of unspecified deep veins of unspecified lower extremity: Secondary | ICD-10-CM | POA: Diagnosis not present

## 2021-02-23 DIAGNOSIS — I480 Paroxysmal atrial fibrillation: Secondary | ICD-10-CM | POA: Diagnosis not present

## 2021-02-23 DIAGNOSIS — I2782 Chronic pulmonary embolism: Secondary | ICD-10-CM | POA: Diagnosis not present

## 2021-02-23 DIAGNOSIS — E1159 Type 2 diabetes mellitus with other circulatory complications: Secondary | ICD-10-CM | POA: Diagnosis not present

## 2021-02-23 DIAGNOSIS — D692 Other nonthrombocytopenic purpura: Secondary | ICD-10-CM | POA: Diagnosis not present

## 2021-02-23 DIAGNOSIS — E1142 Type 2 diabetes mellitus with diabetic polyneuropathy: Secondary | ICD-10-CM | POA: Diagnosis not present

## 2021-02-23 DIAGNOSIS — I2724 Chronic thromboembolic pulmonary hypertension: Secondary | ICD-10-CM | POA: Diagnosis not present

## 2021-02-23 DIAGNOSIS — D6869 Other thrombophilia: Secondary | ICD-10-CM | POA: Diagnosis not present

## 2021-02-23 DIAGNOSIS — K7460 Unspecified cirrhosis of liver: Secondary | ICD-10-CM | POA: Diagnosis not present

## 2021-03-12 ENCOUNTER — Telehealth: Payer: Self-pay | Admitting: Adult Health

## 2021-03-12 NOTE — Telephone Encounter (Signed)
Left message for patient to call back and schedule Medicare Annual Wellness Visit (AWV) either virtually or in office.   AWV-I per [place] 10/07/09 please schedule at anytime with [facility] Nurse Health Advisor 1 or 2   This should be a 45 minute visit.

## 2021-03-15 DIAGNOSIS — K7460 Unspecified cirrhosis of liver: Secondary | ICD-10-CM | POA: Diagnosis not present

## 2021-03-15 DIAGNOSIS — K7469 Other cirrhosis of liver: Secondary | ICD-10-CM | POA: Diagnosis not present

## 2021-03-15 DIAGNOSIS — I81 Portal vein thrombosis: Secondary | ICD-10-CM | POA: Diagnosis not present

## 2021-03-19 ENCOUNTER — Other Ambulatory Visit: Payer: Self-pay | Admitting: Nurse Practitioner

## 2021-03-19 DIAGNOSIS — K7469 Other cirrhosis of liver: Secondary | ICD-10-CM

## 2021-03-27 ENCOUNTER — Ambulatory Visit
Admission: RE | Admit: 2021-03-27 | Discharge: 2021-03-27 | Disposition: A | Discharge status: Home / Self Care | Payer: PPO | Source: Ambulatory Visit | Attending: Nurse Practitioner | Admitting: Nurse Practitioner

## 2021-03-27 DIAGNOSIS — K7469 Other cirrhosis of liver: Secondary | ICD-10-CM

## 2021-03-27 DIAGNOSIS — K7460 Unspecified cirrhosis of liver: Secondary | ICD-10-CM | POA: Diagnosis not present

## 2021-04-24 ENCOUNTER — Telehealth: Payer: Self-pay

## 2021-04-24 NOTE — Progress Notes (Deleted)
Subjective:   Dakota Lewis is a 87 y.o. male who presents for an Initial Medicare Annual Wellness Visit.  I connected with [name] today by telephone and verified that I am speaking with the correct person using two identifiers. Location patient: home Location provider: work Persons participating in the virtual visit: patient, provider.   I discussed the limitations, risks, security and privacy concerns of performing an evaluation and management service by telephone and the availability of in person appointments. I also discussed with the patient that there may be a patient responsible charge related to this service. The patient expressed understanding and verbally consented to this telephonic visit.    Interactive audio and video telecommunications were attempted between this provider and patient, however failed, due to patient having technical difficulties OR patient did not have access to video capability.  We continued and completed visit with audio only.    Review of Systems    N/a       Objective:    There were no vitals filed for this visit. There is no height or weight on file to calculate BMI.  No flowsheet data found.  Current Medications (verified) Outpatient Encounter Medications as of 04/24/2021  Medication Sig   acetaminophen (TYLENOL) 500 MG tablet Take 1,000 mg by mouth 2 (two) times daily as needed (pain).   amLODipine (NORVASC) 10 MG tablet Take 1 tablet (10 mg total) by mouth daily.   apixaban (ELIQUIS) 5 MG TABS tablet Take 5 mg by mouth 2 (two) times daily.   Ascorbic Acid (VITAMIN C) 1000 MG tablet Take 1,000 mg by mouth daily. Patient reports,1 tablet twice daily   Blood Glucose Monitoring Suppl (ONE TOUCH ULTRA 2) w/Device KIT USE TO TEST BLOOD GLUCOSE TWICE DAILY   Calcium Carbonate (CALCIUM-CARB 600 PO) Take 1 tablet by mouth 2 (two) times daily.   Cholecalciferol (D3-1000 PO) Take 1 capsule by mouth daily.    glucose blood test strip USE TO TEST  BLOOD GLUCOSE TWICE DAILY   guaiFENesin (MUCINEX) 600 MG 12 hr tablet Take by mouth 2 (two) times daily.   hydrochlorothiazide (HYDRODIURIL) 25 MG tablet Take 1 tablet (25 mg total) by mouth daily.   losartan (COZAAR) 100 MG tablet TAKE ONE TABLET (100 mg total) BY MOUTH DAILY   Multiple Vitamins-Minerals (ICAPS AREDS 2 PO) Take 1 tablet by mouth 2 (two) times daily.   omeprazole (PRILOSEC) 20 MG capsule Take 20 mg by mouth daily. 1 capsule twice daily   ONE TOUCH ULTRA TEST test strip USE TO TEST BLOOD GLUCOSE TWICE DAILY   ONETOUCH DELICA LANCETS 60V MISC 2 strips by Does not apply route daily.   potassium chloride (MICRO-K) 10 MEQ CR capsule Take 1 capsule (10 mEq total) by mouth daily.   No facility-administered encounter medications on file as of 04/24/2021.    Allergies (verified) Lidocaine and Lisinopril   History: Past Medical History:  Diagnosis Date   Bilateral pulmonary embolism (HCC)    lovenox & coumadin thearpy   Chronic cough    DM (diabetes mellitus) ([place])    DVT (deep venous thrombosis) (HCC)    left lower   Gastrointestinal bleed    prior   H/O cardiovascular stress test 05/07/2011   normal study, low risk scan   H/O Doppler ultrasound 2013   venous duplex doppler   H/O Doppler ultrasound 2012   venous duplex doppler   H/O echocardiogram 03/14/2011   EF 65-70%   H/O exercise stress test 2006  neg bruce protocol excercise stress test   Hiatal hernia    Hypertension    Prostate cancer [facility])    s/p radiation   Past Surgical History:  Procedure Laterality Date   event monitor     2012   HERNIA REPAIR Bilateral    1991, 1992   HIATAL HERNIA REPAIR     1997   IVC FILTER INSERTION     PROSTATE SURGERY     Family History  Problem Relation Age of Onset   Stroke Mother    Kidney disease Father    Heart disease Brother    Arthritis Brother    Heart disease Brother    Prostate cancer Brother    Social History   Socioeconomic History   Marital  status: Married    Spouse name: Not on file   Number of children: 3   Years of education: Not on file   Highest education level: Not on file  Occupational History   Not on file  Tobacco Use   Smoking status: Former    Packs/day: 0.75    Years: 20.00    Pack years: 15.00    Types: Cigarettes   Smokeless tobacco: Never  Vaping Use   Vaping Use: Never used  Substance and Sexual Activity   Alcohol use: No   Drug use: No   Sexual activity: Not on file  Other Topics Concern   Not on file  Social History Narrative   Not on file   Social Determinants of Health   Financial Resource Strain: Not on file  Food Insecurity: Not on file  Transportation Needs: Not on file  Physical Activity: Not on file  Stress: Not on file  Social Connections: Not on file    Tobacco Counseling Counseling given: Not Answered   Clinical Intake:                 Diabetic?***         Activities of Daily Living No flowsheet data found.  Patient Care Team: [name], NP as PCP - General (Family Medicine) Franchot Gallo, MD as Consulting Physician (Urology) Viona Gilmore, [facility] as Pharmacist (Pharmacist)  Indicate any recent Medical Services you may have received from other than [facility] providers in the past year (date may be approximate).     Assessment:   This is a routine wellness examination for [name].  Hearing/Vision screen No results found.  Dietary issues and exercise activities discussed:     Goals Addressed   None    Depression Screen PHQ 2/9 Scores 07/07/2020 07/07/2019 04/23/2018  PHQ - 2 Score 0 0 0    Fall Risk Fall Risk  09/20/2020 07/07/2020 07/07/2019 04/23/2018  Falls in the past year? 0 0 0 No  Follow up Falls evaluation completed - - -    FALL RISK PREVENTION PERTAINING TO THE HOME:  Any stairs in or around the home? {YES/NO:21197} If so, are there any without handrails? {YES/NO:21197} Home free of loose throw rugs in walkways, pet beds,  electrical cords, etc? {YES/NO:21197} Adequate lighting in your home to reduce risk of falls? {YES/NO:21197}  ASSISTIVE DEVICES UTILIZED TO PREVENT FALLS:  Life alert? {YES/NO:21197} Use of a cane, walker or w/c? {YES/NO:21197} Grab bars in the bathroom? {YES/NO:21197} Shower chair or bench in shower? {YES/NO:21197} Elevated toilet seat or a handicapped toilet? {YES/NO:21197}  TIMED UP AND GO:  Was the test performed? {YES/NO:21197}.  Length of time to ambulate 10 feet: *** sec.   {Appearance of QTMA:2633354}  Cognitive Function:        Immunizations Immunization History  Administered Date(s) Administered   Fluad Quad(high Dose 65+) 07/07/2019, 07/07/2020   Influenza,inj,Quad PF,6+ Mos 07/18/2017, 07/22/2018   Moderna SARS-COV2 Booster Vaccination 09/08/2020   Moderna Sars-Covid-2 Vaccination 10/27/2019, 11/24/2019, 09/08/2020   Pneumococcal Conjugate-13 10/21/2013   Pneumococcal Polysaccharide-23 12/19/2016   Td 08/02/2014   Zoster Recombinat (Shingrix) 07/17/2019, 12/11/2019   Zoster, Live 11/02/2012    {TDAP status:2101805}  {Flu Vaccine status:2101806}  {Pneumococcal vaccine status:2101807}  {Covid-19 vaccine status:2101808}  Qualifies for Shingles Vaccine? {YES/NO:21197}  Zostavax completed {YES/NO:21197}  {Shingrix Completed?:2101804}  Screening Tests Health Maintenance  Topic Date Due   OPHTHALMOLOGY EXAM  08/16/2020   COVID-19 Vaccine (4 - Booster for Moderna series) 12/07/2020   HEMOGLOBIN A1C  12/14/2020   INFLUENZA VACCINE  05/07/2021   FOOT EXAM  07/07/2021   TETANUS/TDAP  08/02/2024   PNA vac Low Risk Adult  Completed   Zoster Vaccines- Shingrix  Completed   HPV VACCINES  Aged Out    Health Maintenance  Health Maintenance Due  Topic Date Due   OPHTHALMOLOGY EXAM  08/16/2020   COVID-19 Vaccine (4 - Booster for Moderna series) 12/07/2020   HEMOGLOBIN A1C  12/14/2020    {Colorectal cancer screening:2101809}  Lung Cancer Screening:  (Low Dose CT Chest recommended if Age 47-80 years, 30 pack-year currently smoking OR have quit w/in 15years.) {DOES NOT does:27190::"does not"} qualify.   Lung Cancer Screening Referral: ***  Additional Screening:  Hepatitis C Screening: {DOES NOT does:27190::"does not"} qualify; Completed ***  Vision Screening: Recommended annual ophthalmology exams for early detection of glaucoma and other disorders of the eye. Is the patient up to date with their annual eye exam?  {YES/NO:21197} Who is the provider or what is the name of the office in which the patient attends annual eye exams? *** If pt is not established with a provider, would they like to be referred to a provider to establish care? {YES/NO:21197}.   Dental Screening: Recommended annual dental exams for proper oral hygiene  Community Resource Referral / Chronic Care Management: CRR required this visit?  {YES/NO:21197}  CCM required this visit?  {YES/NO:21197}     Plan:     I have personally reviewed and noted the following in the patient's chart:   Medical and social history Use of alcohol, tobacco or illicit drugs  Current medications and supplements including opioid prescriptions. {Opioid Prescriptions:[id number]} Functional ability and status Nutritional status Physical activity Advanced directives List of other physicians Hospitalizations, surgeries, and ER visits in previous 12 months Vitals Screenings to include cognitive, depression, and falls Referrals and appointments  In addition, I have reviewed and discussed with patient certain preventive protocols, quality metrics, and best practice recommendations. A written personalized care plan for preventive services as well as general preventive health recommendations were provided to patient.     Randel Pigg, LPN   5/45/6256   Nurse Notes: ***

## 2021-04-24 NOTE — Telephone Encounter (Signed)
No answer for patient 145pm [facility] called 3 times. No machine to leave a message .  Patient may reschedule next available appointment .  L.Elvyn Krohn,LPN

## 2021-04-25 ENCOUNTER — Telehealth: Payer: Self-pay | Admitting: Pharmacist

## 2021-04-25 NOTE — Chronic Care Management (AMB) (Signed)
Chronic Care Management Pharmacy Assistant   Name: PEIGHTON EDGIN  MRN: [id number] DOB: 12/28/1932  Reason for Encounter: Disease State   Conditions to be addressed/monitored: HTN  Recent office visits:  None  Recent consult visits:  06.09.2022 Carlis Stable, NP [facility] follow-up visit for liver disease.  Hospital visits:  None in previous 6 months  Medications: Outpatient Encounter Medications as of 04/25/2021  Medication Sig Note   acetaminophen (TYLENOL) 500 MG tablet Take 1,000 mg by mouth 2 (two) times daily as needed (pain).    amLODipine (NORVASC) 10 MG tablet Take 1 tablet (10 mg total) by mouth daily.    apixaban (ELIQUIS) 5 MG TABS tablet Take 5 mg by mouth 2 (two) times daily.    Ascorbic Acid (VITAMIN C) 1000 MG tablet Take 1,000 mg by mouth daily. Patient reports,1 tablet twice daily    Blood Glucose Monitoring Suppl (ONE TOUCH ULTRA 2) w/Device KIT USE TO TEST BLOOD GLUCOSE TWICE DAILY    Calcium Carbonate (CALCIUM-CARB 600 PO) Take 1 tablet by mouth 2 (two) times daily.    Cholecalciferol (D3-1000 PO) Take 1 capsule by mouth daily.  11/25/2019: Patient reports taking 2 tablets in the morning.    glucose blood test strip USE TO TEST BLOOD GLUCOSE TWICE DAILY    guaiFENesin (MUCINEX) 600 MG 12 hr tablet Take by mouth 2 (two) times daily.    hydrochlorothiazide (HYDRODIURIL) 25 MG tablet Take 1 tablet (25 mg total) by mouth daily.    losartan (COZAAR) 100 MG tablet TAKE ONE TABLET (100 mg total) BY MOUTH DAILY    Multiple Vitamins-Minerals (ICAPS AREDS 2 PO) Take 1 tablet by mouth 2 (two) times daily.    omeprazole (PRILOSEC) 20 MG capsule Take 20 mg by mouth daily. 1 capsule twice daily 11/25/2019: Patient reports taking 1 capsule twice daily   ONE TOUCH ULTRA TEST test strip USE TO TEST BLOOD GLUCOSE TWICE DAILY    ONETOUCH DELICA LANCETS 50K MISC 2 strips by Does not apply route daily.    potassium chloride (MICRO-K) 10 MEQ CR capsule  Take 1 capsule (10 mEq total) by mouth daily.    No facility-administered encounter medications on file as of 04/25/2021.  Reviewed chart prior to disease state call. Spoke with patient regarding BP  Recent Office Vitals: BP Readings from Last 3 Encounters:  09/20/20 132/78  07/17/20 (!) 149/75  07/07/20 (!) 152/68   Pulse Readings from Last 3 Encounters:  09/20/20 95  07/17/20 (!) 51  07/07/20 60    Wt Readings from Last 3 Encounters:  09/20/20 195 lb 3.2 oz (88.5 kg)  07/17/20 193 lb 11.2 oz (87.9 kg)  07/07/20 198 lb 3 oz (89.9 kg)     Kidney Function Lab Results  Component Value Date/Time   CREATININE 1.0 06/16/2020 12:00 AM   CREATININE 1.03 07/07/2019 09:20 AM   CREATININE 1.11 04/23/2018 08:25 AM   GFR 68.71 07/07/2019 09:20 AM   GFRNONAA >60 06/16/2020 12:00 AM   GFRAA  03/15/2011 04:00 AM    >60        The eGFR has been calculated using the MDRD equation. This calculation has not been validated in all clinical situations. eGFR's persistently <60 mL/min signify possible Chronic Kidney Disease.    BMP Latest Ref Rng & Units 06/16/2020 07/07/2019 05/25/2018  Glucose 70 - 99 mg/dL - 123(H) -  BUN 4 - 21 32(A) 23 -  Creatinine 0.6 - 1.3 1.0 1.03 -  Sodium 137 - 147 135(A) 138 -  Potassium 3.4 - 5.3 4.8 4.7 -  Chloride 99 - 108 101 100 -  CO2 13 - 22 30(A) 29 -  Calcium 8.4 - 10.5 mg/dL - 10.2 10.7(H)   Current antihypertensive regimen:  Amlodipine 21m, 1 tablet daily - in PM Hydrochlorothiazide 221m 1 tablet daily - in AM Losartan 10062m1 tablet daily - in AM How often are you checking your Blood Pressure? 3-5x per week Current home BP readings:  07.15 132/67 07.16 134/66 07.18 140/70 07.19 140/72 What recent interventions/DTPs have been made by any provider to improve Blood Pressure control since last CPP Visit: None Any recent hospitalizations or ED visits since last visit with CPP? No What diet changes have been made to improve Blood Pressure  Control?  No Change What exercise is being done to improve your Blood Pressure Control?  No Change  Adherence Review: Is the patient currently on ACE/ARB medication? Yes Does the patient have >5 day gap between last estimated fill dates? No  I called and spoke with the patient's daughter NanIzora Galaout medication adherence. She states that he has been doing well. No recent changes in his medications. He is not experiencing any side effects from his current medications. She continues to help him monitor his blood pressure three to five times a week. He recently had his Medicare Wellness visit. There have been no changes in his diet or exercise routine. No current falls or injuries. There have been no hospital visits emergency department visits or urgent care visits since his last CPP or PCP visit. The patients next CCM appointment is scheduled for September 2022.  Care Gaps: Eye Exam COVID-19 Hgb A1C  Star Rating Drugs: Medication Dispensed Quantity Pharmacy  Losartan 100 mg 04.27.2022 [facility]   AmiArlis PortaiRevonda StandardMAGosperarmacist Assistant [phone]

## 2021-05-29 DIAGNOSIS — L820 Inflamed seborrheic keratosis: Secondary | ICD-10-CM | POA: Diagnosis not present

## 2021-05-29 DIAGNOSIS — L570 Actinic keratosis: Secondary | ICD-10-CM | POA: Diagnosis not present

## 2021-05-31 ENCOUNTER — Other Ambulatory Visit: Payer: Self-pay | Admitting: Adult Health

## 2021-05-31 DIAGNOSIS — I10 Essential (primary) hypertension: Secondary | ICD-10-CM

## 2021-07-03 ENCOUNTER — Telehealth: Payer: Self-pay | Admitting: Pharmacist

## 2021-07-03 NOTE — Chronic Care Management (AMB) (Signed)
    Chronic Care Management Pharmacy Assistant   Name: Dakota Lewis  MRN: [id number] DOB: 22-Jan-1933  07/04/2021 APPOINTMENT REMINDER   Called [name], No answer, left message of appointment on 07/04/2021 at 1:00 via telephone visit with Jeni Salles, Pharm D. Notified to have all medications, supplements, blood pressure and/or blood sugar logs available during appointment and to return call if need to reschedule.   Care Gaps:  AWV - completed 04/24/2021 Ophthalmology - overdue 08/16/2020 Covid 19 vaccine booster 4 - overdue 12/01/2020 HGA1C - overdue 12/14/2020 Flu vaccine - due  Star Rating Drug:  Losartan 18m - last filled  05/02/2021 [facility] [phone]

## 2021-07-04 ENCOUNTER — Ambulatory Visit (INDEPENDENT_AMBULATORY_CARE_PROVIDER_SITE_OTHER): Payer: PPO | Admitting: Pharmacist

## 2021-07-04 DIAGNOSIS — I10 Essential (primary) hypertension: Secondary | ICD-10-CM

## 2021-07-04 DIAGNOSIS — E119 Type 2 diabetes mellitus without complications: Secondary | ICD-10-CM

## 2021-07-04 NOTE — Progress Notes (Signed)
Chronic Care Management Pharmacy Note  07/04/2021 Name:  Dakota Lewis MRN:  [id number] DOB:  1933/05/04  Summary: A1c not at goal < 7% BP not at goal < 140/90   Recommendations/Changes made from today's visit: -Recommended decreasing omeprazole to every other day -Recommended daily checking of BP and to bring BP cuff to PCP office visit next week -Consider stopping calcium supplementation based on lack of indication -Recommend combining losartan and HCTZ into combination medication   Plan: Mailed healthy plate handout for diet changes Follow up BP assessment in 1-2 months  Subjective: Dakota Lewis is an 87 y.o. year old male who is a primary patient of Dakota Peng, NP.  The CCM team was consulted for assistance with disease management and care coordination needs.    Engaged with patient by telephone for follow up visit in response to provider referral for pharmacy case management and/or care coordination services.   Consent to Services:  The patient was given information about Chronic Care Management services, agreed to services, and gave verbal consent prior to initiation of services.  Please see initial visit note for detailed documentation.   Patient Care Team: Dakota Peng, NP as PCP - General (Family Medicine) Franchot Gallo, MD as Consulting Physician (Urology) Viona Gilmore, [facility] as Pharmacist (Pharmacist)  Recent office visits: 09/20/20 Dakota Peng, NP: Patient presented for upper respiratory tract infection. Plan to finish Z-pak.   07/07/20 Dakota Peng, NP: Patient presented for chronic disease follow up. Influenza vaccine given during visit. Labs stable from [place] and did not redo. Continued current medications. Continued monitoring Q6 months for diabetes and still managed with diet. Follow up in 1 year.  Recent consult visits: [facility]: [facility] presented for routine chronic conditions follow up. Lab work: A1c 7.3, Mag 1.6, TGs 211,  microalbumin/creatinine 79.2.  05/01/21 Noelle Penner [facility]): Patient presented for eye exam follow up.   03/15/21 Roosevelt Locks, NP (transplant): Patient presented for NASH follow up. Plan for ultrasound.  03/15/21 Phillippe Zamor (gastroenterology): Patient presented for portal vein hypertension follow up. Unable to access notes.   07/17/20 Bing Ree, PA-C (vascular & vein): Patient presented for an initial evaluation of varicose veins. BP was slightly elevated in office. Recommended elevation, compression and follow up. Follow up in 3 months.  Hospital visits: None in previous 6 months  Objective:  Lab Results  Component Value Date   CREATININE 1.0 06/16/2020   BUN 32 (A) 06/16/2020   GFR 68.71 07/07/2019   GFRNONAA >60 06/16/2020   GFRAA  03/15/2011    >60        The eGFR has been calculated using the MDRD equation. This calculation has not been validated in all clinical situations. eGFR's persistently <60 mL/min signify possible Chronic Kidney Disease.   NA 135 (A) 06/16/2020   K 4.8 06/16/2020   CALCIUM 10.2 07/07/2019   CO2 30 (A) 06/16/2020   GLUCOSE 123 (H) 07/07/2019    Lab Results  Component Value Date/Time   HGBA1C 6.8 06/16/2020 12:00 AM   HGBA1C 6.3 07/07/2019 09:20 AM   HGBA1C 6.1 04/23/2018 08:25 AM   GFR 68.71 07/07/2019 09:20 AM   GFR 67.18 04/23/2018 08:25 AM    Last diabetic Eye exam:  Lab Results  Component Value Date/Time   HMDIABEYEEXA No Retinopathy 04/06/2016 12:00 AM    Last diabetic Foot exam:  Lab Results  Component Value Date/Time   HMDIABFOOTEX Abstracted/Dr. Marily Memos 12/19/2016 12:00 AM     Lab Results  Component Value  Date   CHOL 206 (A) 06/16/2020   HDL 83 (A) 06/16/2020   LDLCALC 105 06/16/2020   LDLDIRECT 100.0 07/07/2019   TRIG 92 06/16/2020   CHOLHDL 3 07/07/2019    Hepatic Function Latest Ref Rng & Units 06/16/2020 07/07/2019 04/23/2018  Total Protein 6.0 - 8.3 g/dL - 7.2 7.3  Albumin 3.5 - 5.0 3.9 4.4 4.3  AST 14 -  40 24 18 24   ALT 10 - 40 49(A) 25 33  Alk Phosphatase 39 - 117 U/L - 76 76  Total Bilirubin 0.2 - 1.2 mg/dL - 0.7 0.4  Bilirubin, Direct 0.01 - 0.4 0.2 - 0.1    Lab Results  Component Value Date/Time   TSH 1.21 06/16/2020 12:00 AM   TSH 1.15 07/07/2019 09:20 AM    CBC Latest Ref Rng & Units 06/16/2020 09/24/2019 07/07/2019  WBC - 6.7 6.4 6.2  Hemoglobin 13.5 - 17.5 14.1 13.3 13.3  Hematocrit 41 - 53 42 39.9 39.4  Platelets 150 - 399 196 191.0 183.0    Lab Results  Component Value Date/Time   VD25OH 58.53 05/25/2018 08:24 AM   VD25OH 48.5 03/28/2017 12:00 AM    Clinical ASCVD: No  The ASCVD Risk score (Arnett DK, et al., 2019) failed to calculate for the following reasons:   The 2019 ASCVD risk score is only valid for ages 92 to 69    Depression screen PHQ 2/9 07/07/2020 07/07/2019 04/23/2018  Decreased Interest 0 0 0  Down, Depressed, Hopeless 0 0 0  PHQ - 2 Score 0 0 0     Social History   Tobacco Use  Smoking Status Former   Packs/day: 0.75   Years: 20.00   Pack years: 15.00   Types: Cigarettes  Smokeless Tobacco Never   BP Readings from Last 3 Encounters:  09/20/20 132/78  07/17/20 (!) 149/75  07/07/20 (!) 152/68   Pulse Readings from Last 3 Encounters:  09/20/20 95  07/17/20 (!) 51  07/07/20 60   Wt Readings from Last 3 Encounters:  09/20/20 195 lb 3.2 oz (88.5 kg)  07/17/20 193 lb 11.2 oz (87.9 kg)  07/07/20 198 lb 3 oz (89.9 kg)   BMI Readings from Last 3 Encounters:  09/20/20 30.57 kg/m  07/17/20 30.34 kg/m  07/07/20 31.04 kg/m    Assessment/Interventions: Review of patient past medical history, allergies, medications, health status, including review of consultants reports, laboratory and other test data, was performed as part of comprehensive evaluation and provision of chronic care management services.   SDOH:  (Social Determinants of Health) assessments and interventions performed: No  SDOH Screenings   Alcohol Screen: Not on file   Depression (PHQ2-9): Low Risk    PHQ-2 Score: 0  Financial Resource Strain: Not on file  Food Insecurity: Not on file  Housing: Not on file  Physical Activity: Not on file  Social Connections: Not on file  Stress: Not on file  Tobacco Use: Medium Risk   Smoking Tobacco Use: Former   Smokeless Tobacco Use: Never  Transportation Needs: Not on file    [place]  Allergies  Allergen Reactions   Lidocaine     ANTI [place].   Lisinopril     cough    Medications Reviewed Today     Reviewed by Adair Laundry, [place] (Certified Medical Assistant) on 09/20/20 at [place] List Status: <None>   Medication Order Taking? Sig Documenting Provider Last Dose Status Informant  acetaminophen (TYLENOL) 500 MG tablet  [id number] Yes Take 1,000 mg by mouth 2 (two) times daily as needed (pain). [provider] Taking Active Self  amLODipine (NORVASC) 10 MG tablet [id number] Yes Take 1 tablet (10 mg total) by mouth daily. Nafziger, Tommi Rumps, NP Taking Active   apixaban (ELIQUIS) 5 MG TABS tablet [id number] Yes Take 5 mg by mouth 2 (two) times daily. [provider] Taking Active   Ascorbic Acid (VITAMIN C) 1000 MG tablet [id number] Yes Take 1,000 mg by mouth daily. Patient reports,1 tablet twice daily [provider] Taking Active Self  azithromycin (ZITHROMAX) 250 MG tablet [id number] Yes Take 250 mg by mouth daily. [provider] Taking Active   Blood Glucose Monitoring Suppl (ONE TOUCH ULTRA 2) w/Device KIT [id number] Yes USE TO TEST BLOOD GLUCOSE TWICE DAILY Nafziger, Tommi Rumps, NP Taking Active   Calcium Carbonate (CALCIUM-CARB 600 PO) [id number] Yes Take 2 tablets by mouth 2 (two) times daily. [provider] Taking Active   Cholecalciferol (D3-1000 PO) [id number] Yes Take 1 capsule by mouth daily.  [provider] Taking Active Self           Med Note Glennis Brink, ANNETTE   Thu Nov 25, 2019 10:59 AM) Patient reports  taking 2 tablets in the morning.   glucose blood test strip [id number] Yes USE TO TEST BLOOD GLUCOSE TWICE DAILY Nafziger, Tommi Rumps, NP Taking Active   guaiFENesin (MUCINEX) 600 MG 12 hr tablet [id number] Yes Take by mouth 2 (two) times daily. [provider] Taking Active   hydrochlorothiazide (HYDRODIURIL) 25 MG tablet [id number] Yes Take 1 tablet (25 mg total) by mouth daily. Nafziger, Tommi Rumps, NP Taking Active   losartan (COZAAR) 100 MG tablet [id number] Yes TAKE ONE TABLET (100 mg total) BY MOUTH DAILY Nafziger, Tommi Rumps, NP Taking Active   Multiple Vitamins-Minerals (ICAPS AREDS 2 PO) [id number] Yes Take 1 tablet by mouth 2 (two) times daily. [provider] Taking Active Self  Omega-3 Fatty Acids (FISH OIL PO) [id number] Yes Take 1 capsule by mouth 2 (two) times daily. [provider] Taking Active   omeprazole (PRILOSEC) 20 MG capsule [id number] Yes Take 20 mg by mouth daily. 1 capsule twice daily [provider] Taking Active Self           Med Note Rudean Haskell Nov 25, 2019 10:38 AM) Patient reports taking 1 capsule twice daily  ONE TOUCH ULTRA TEST test strip [id number] Yes USE TO TEST BLOOD GLUCOSE TWICE DAILY Nafziger, Tommi Rumps, NP Taking Active   [facility] DELICA LANCETS 96P [place] [id number] Yes 2 strips by Does not apply route daily. Nafziger, Tommi Rumps, NP Taking Active   potassium chloride (MICRO-K) 10 MEQ CR capsule [id number] Yes Take 1 capsule (10 mEq total) by mouth daily. Dakota Peng, NP Taking Active             Patient Active Problem List   Diagnosis Date Noted   Diet-controlled diabetes mellitus ([place]) 12/30/2017   Atypical chest pain 11/28/2017   Pulmonary embolism ([place]) 07/09/2017   Prostate cancer ([place])    Hypertension     Immunization History  Administered Date(s) Administered   Fluad Quad(high Dose 65+) 07/07/2019, 07/07/2020   Influenza,inj,Quad PF,6+ Mos 07/18/2017, 07/22/2018   Moderna SARS-COV2 Booster Vaccination 09/08/2020    Moderna Sars-Covid-2 Vaccination 10/27/2019, 11/24/2019, 09/08/2020   Pneumococcal Conjugate-13 10/21/2013   Pneumococcal Polysaccharide-23 12/19/2016   Td 08/02/2014   Zoster Recombinat (Shingrix) 07/17/2019, 12/11/2019   Zoster, Live 11/02/2012   Patient reports he has been eating out more often because  of his granddaugther. He did also quit drinking coffee all together because he was worried it would affect his bladder and blood pressure. He is only drinking water.  Recommended looking for low sodium salt as patient cannot find no sodium salt right now.  Conditions to be addressed/monitored:  Hypertension, Hyperlipidemia, Diabetes, GERD, Osteoarthritis and History of PE, prostate cancer, cirrhosis  Conditions addressed this visit: Hypertension, GERD, diabetes  Care Plan : CCM Pharmacy Care Plan  Updates made by Viona Gilmore, [place] since 07/04/2021 12:00 AM     Problem: Problem: Hypertension, Hyperlipidemia, Diabetes, GERD, Osteoarthritis and History of PE, prostate cancer, cirrhosis      Long-Range Goal: Patient-Specific Goal   Start Date: 02/21/2021  Expected End Date: 02/21/2022  Recent Progress: On track  Priority: High  Note:   Current Barriers:  Unable to independently monitor therapeutic efficacy Unable to achieve control of diabetes   Pharmacist Clinical Goal(s):  Patient will achieve adherence to monitoring guidelines and medication adherence to achieve therapeutic efficacy through collaboration with PharmD and provider.   Interventions: 1:1 collaboration with Dakota Peng, NP regarding development and update of comprehensive plan of care as evidenced by provider attestation and co-signature Inter-disciplinary care team collaboration (see longitudinal plan of care) Comprehensive medication review performed; medication list updated in electronic medical record  Hypertension (BP goal <140/90) -Not ideally controlled -Current treatment: amlodipine 89m, 1 tablet  daily - in PM hydrochlorothiazide 255m 1 tablet daily - in AM losartan 1001m1 tablet daily - in AM -Medications previously tried: none  -Current home readings: 144/65 HR 60, 146/65 HR 54, 112/59 HR 68, 131/62 HR 73, 158/62 HR 63, 140/63 HR 55 -Current dietary habits: patient doesn't eat out often and does not eat frozen foods; his breakfast consists of a jimmy dean sausage biscuit -Current exercise habits: 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 Consider combination BP medication for losartan-HCTZ.  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 -Educated on Cholesterol goals;  -Counseled on diet and exercise extensively  Diabetes (A1c goal <7%) -Controlled -Current medications: No medications -Medications previously tried: none  -Current home glucose readings fasting glucose: 200s (213 this morning) post prandial glucose: does not check at home -Denies hypoglycemic/hyperglycemic symptoms -Current meal patterns:  breakfast: did not discuss  lunch: did not discuss   dinner: did not discuss  snacks: did not discuss  drinks: did not discuss  -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 -Educated on A1c and blood sugar goals; Carbohydrate counting and/or plate  method -Counseled to check feet daily and get yearly eye exams -Counseled on diet and exercise extensively  History of PE (Goal: prevent blood clots) -Controlled -Current treatment  Eliquis 5mg22m tablet twice daily -Medications previously tried: warfarin -Recommended to continue current medication  GERD (Goal: minimize symptoms of heartburn/acid reflux) -Controlled -Current treatment  Omeprazole 20mg59mcapsule daily -Medications previously tried: none  -Counseled on non-pharmacologic management of  symptoms such as elevating the head of your bed, avoiding eating 2-3 hours before bed, avoiding triggering foods such as acidic, spicy, or fatty foods, eating smaller meals, and wearing clothes that are loose around the waist Recommended decreasing to omeprazole 1 capsule every other day.  Osteoarthritis (Goal: minimize pain) -Controlled -Current treatment  Tylenol 551m, 2 tablets twice daily as needed for pain CBD oil as needed -Medications previously tried: none  -Recommended to continue current medication Counseled on maximum daily amount of Tylenol of 20086mas patient has cirrhosis.  Health Maintenance -Vaccine gaps: none -Current therapy:  Vitamin C 1000 mg 1 tablet once daily  Calcium 600 mg 1 tablet twice a daily Vitamin D 5000 units - 2 tablets daily -Educated on Herbal supplement research is limited and benefits usually cannot be proven Cost vs benefit of each product must be carefully weighed by individual consumer Supplements may interfere with prescription drugs -Patient is satisfied with current therapy and denies issues -Recommended to continue current medication Recommended stopping calcium based on lack of indication.  Patient Goals/Self-Care Activities Patient will:  - take medications as prescribed check blood pressure weekly, document, and provide at future appointments target a minimum of 150 minutes of moderate intensity exercise weekly  Follow Up  Plan: Telephone follow up appointment with care management team member scheduled for: 4 months        Medication Assistance: None required.  Patient affirms current coverage meets needs.  Compliance/Adherence/Medication fill history: Care Gaps: Eye exam, COVID booster, influenza   Star-Rating Drugs: Losartan 10041m last filled  05/02/2021 90DS [facility] [place]  Patient's preferred pharmacy is:  [facility] [place]one: [phone]: [phone] [place]one: [phone]: [phone] pill box? Yes - 3 pill boxes (1 in AM, 1 in afternoon, 1 in PM) Pt endorses 99% compliance  We discussed: Current pharmacy is preferred with insurance plan and patient is satisfied with pharmacy services Patient decided to: Continue current medication management strategy  Care Plan and Follow Up Patient Decision:  Patient agrees to Care Plan and Follow-up.  Plan: Telephone follow up appointment with care management team member scheduled for:  4 months  MadJeni SallesharmD BCACulver Cityarmacist LeBMexico BraWest [phone]

## 2021-07-06 DIAGNOSIS — I10 Essential (primary) hypertension: Secondary | ICD-10-CM | POA: Diagnosis not present

## 2021-07-06 DIAGNOSIS — E119 Type 2 diabetes mellitus without complications: Secondary | ICD-10-CM | POA: Diagnosis not present

## 2021-07-10 ENCOUNTER — Telehealth: Payer: Self-pay | Admitting: Pharmacist

## 2021-07-10 NOTE — Telephone Encounter (Signed)
Per discussion with PCP, patient is ok to stop supplementation with calcium. Patient is aware to stop calcium and removed from medication list.

## 2021-07-12 ENCOUNTER — Ambulatory Visit (INDEPENDENT_AMBULATORY_CARE_PROVIDER_SITE_OTHER): Payer: PPO | Admitting: Adult Health

## 2021-07-12 ENCOUNTER — Other Ambulatory Visit: Payer: Self-pay

## 2021-07-12 ENCOUNTER — Encounter: Payer: Self-pay | Admitting: Adult Health

## 2021-07-12 VITALS — BP 150/70 | HR 48 | Temp 98.1°F | Ht 65.5 in | Wt 187.0 lb

## 2021-07-12 DIAGNOSIS — E119 Type 2 diabetes mellitus without complications: Secondary | ICD-10-CM | POA: Diagnosis not present

## 2021-07-12 DIAGNOSIS — Z23 Encounter for immunization: Secondary | ICD-10-CM

## 2021-07-12 DIAGNOSIS — I2699 Other pulmonary embolism without acute cor pulmonale: Secondary | ICD-10-CM

## 2021-07-12 DIAGNOSIS — Z0000 Encounter for general adult medical examination without abnormal findings: Secondary | ICD-10-CM

## 2021-07-12 DIAGNOSIS — I10 Essential (primary) hypertension: Secondary | ICD-10-CM | POA: Diagnosis not present

## 2021-07-12 DIAGNOSIS — E782 Mixed hyperlipidemia: Secondary | ICD-10-CM

## 2021-07-12 DIAGNOSIS — K7460 Unspecified cirrhosis of liver: Secondary | ICD-10-CM

## 2021-07-12 DIAGNOSIS — C61 Malignant neoplasm of prostate: Secondary | ICD-10-CM | POA: Diagnosis not present

## 2021-07-12 LAB — CBC WITH DIFFERENTIAL/PLATELET
Basophils Absolute: 0 10*3/uL (ref 0.0–0.1)
Basophils Relative: 0.6 % (ref 0.0–3.0)
Eosinophils Absolute: 0.1 10*3/uL (ref 0.0–0.7)
Eosinophils Relative: 3 % (ref 0.0–5.0)
HCT: 39.1 % (ref 39.0–52.0)
Hemoglobin: 13.1 g/dL (ref 13.0–17.0)
Lymphocytes Relative: 41.1 % (ref 12.0–46.0)
Lymphs Abs: 1.9 10*3/uL (ref 0.7–4.0)
MCHC: 33.4 g/dL (ref 30.0–36.0)
MCV: 91 fl (ref 78.0–100.0)
Monocytes Absolute: 0.4 10*3/uL (ref 0.1–1.0)
Monocytes Relative: 8.6 % (ref 3.0–12.0)
Neutro Abs: 2.1 10*3/uL (ref 1.4–7.7)
Neutrophils Relative %: 46.7 % (ref 43.0–77.0)
Platelets: 200 10*3/uL (ref 150.0–400.0)
RBC: 4.3 Mil/uL (ref 4.22–5.81)
RDW: 12.9 % (ref 11.5–15.5)
WBC: 4.6 10*3/uL (ref 4.0–10.5)

## 2021-07-12 LAB — LIPID PANEL
Cholesterol: 158 mg/dL (ref 0–200)
HDL: 53.7 mg/dL (ref >39.00)
LDL Cholesterol: 67 mg/dL (ref 0–99)
NonHDL: 104.47
Total CHOL/HDL Ratio: 3
Triglycerides: 188 mg/dL — ABNORMAL HIGH (ref 0.0–149.0)
VLDL: 37.6 mg/dL (ref 0.0–40.0)

## 2021-07-12 LAB — COMPREHENSIVE METABOLIC PANEL
ALT: 31 U/L (ref 0–53)
AST: 27 U/L (ref 0–37)
Albumin: 4.3 g/dL (ref 3.5–5.2)
Alkaline Phosphatase: 70 U/L (ref 39–117)
BUN: 20 mg/dL (ref 6–23)
CO2: 29 mEq/L (ref 19–32)
Calcium: 10 mg/dL (ref 8.4–10.5)
Chloride: 99 mEq/L (ref 96–112)
Creatinine, Ser: 1.09 mg/dL (ref 0.40–1.50)
GFR: 61.43 mL/min (ref >60.00)
Glucose, Bld: 125 mg/dL — ABNORMAL HIGH (ref 70–99)
Potassium: 4.3 mEq/L (ref 3.5–5.1)
Sodium: 135 mEq/L (ref 135–145)
Total Bilirubin: 0.8 mg/dL (ref 0.2–1.2)
Total Protein: 7 g/dL (ref 6.0–8.3)

## 2021-07-12 LAB — TSH: TSH: 0.78 u[IU]/mL (ref 0.35–5.50)

## 2021-07-12 LAB — HEMOGLOBIN A1C: Hgb A1c MFr Bld: 7.2 % — ABNORMAL HIGH (ref 4.6–6.5)

## 2021-07-12 MED ORDER — LOSARTAN POTASSIUM-HCTZ 100-25 MG PO TABS: 1.0000 | ORAL_TABLET | Freq: Every day | ORAL | 3 refills | Status: DC

## 2021-07-12 NOTE — Addendum Note (Signed)
Addended by: Amanda Cockayne on: 07/12/2021 09:44 AM   Modules accepted: Orders

## 2021-07-12 NOTE — Patient Instructions (Addendum)
I am going to send in a combination pill for your blood pressure medication. This will take the place of Losartan and HCTZ. Continue with Norvasc 10 mg.   Please work on diet - less sugars and salt   I will see you back in three months or sooner if needed

## 2021-07-12 NOTE — Progress Notes (Signed)
Subjective:    Patient ID: Dakota Lewis, male    DOB: 04-Jun-1933, 87 y.o.   MRN: [id number]  HPI  Patient presents for yearly preventative medicine examination. He is a pleasant 87 year old male who  has a past medical history of Bilateral pulmonary embolism ([place]), Chronic cough, DM (diabetes mellitus) ([place]), DVT (deep venous thrombosis) ([place]), Gastrointestinal bleed, H/O cardiovascular stress test (05/07/2011), H/O Doppler ultrasound (2013), H/O Doppler ultrasound (2012), H/O echocardiogram (03/14/2011), H/O exercise stress test (2006), Hiatal hernia, Hypertension, and Prostate cancer ([place]).  [facility] one month ago.   Diabetes mellitus-currently diet controlled. Has last A1c on 06/15/2021 at the [place] was 7.3 - he was advised to work on diet. BS running between 140-200's.  Lab Results  Component Value Date   HGBA1C 6.8 06/16/2020   Hypertension -takes Norvasc 10 mg daily, HCTZ 25 mg daily, and losartan 100 mg daily.  He does monitor his blood pressure at home and has had blood pressure readings from 115-160's/60-80's.with most being in the 130-140's.  He did not take his blood pressure medication this morning  BP Readings from Last 3 Encounters:  07/12/21 (!) 150/70  09/20/20 132/78  07/17/20 (!) 149/75   History of PE -takes Eliquis 5 mg twice daily.  He denies black tarry stools, nosebleeds, or coughing up blood  GERD -prescribed omeprazole 20 mg twice daily.  Feels well controlled on this medication and has been working on weaning himself off the medication.   Hyperlipidemia -takes omega-3 fatty acids twice daily Lab Results  Component Value Date   CHOL 206 (A) 06/16/2020   HDL 83 (A) 06/16/2020   LDLCALC 105 06/16/2020   LDLDIRECT 100.0 07/07/2019   TRIG 92 06/16/2020   CHOLHDL 3 07/07/2019   H/o Prostate CA-radiation therapy in 2009.  He is followed by urology on a yearly basis.  Denies any complaints  NASH-followed by GI in  [place].   All immunizations and health maintenance protocols were reviewed with the patient and needed orders were placed.  Appropriate screening laboratory values were ordered for the patient including screening of hyperlipidemia, renal function and hepatic function. If indicated by BPH, a PSA was ordered.  Medication reconciliation,  past medical history, social history, problem list and allergies were reviewed in detail with the patient  Goals were established with regard to weight loss, exercise, and  diet in compliance with medications. Since his A1c went up, he has been working on reducing sodium and sugar  Wt Readings from Last 3 Encounters:  07/12/21 187 lb (84.8 kg)  09/20/20 195 lb 3.2 oz (88.5 kg)  07/17/20 193 lb 11.2 oz (87.9 kg)      Review of Systems  Constitutional: Negative.   HENT:  Positive for hearing loss.   Eyes: Negative.   Respiratory: Negative.    Cardiovascular: Negative.   Gastrointestinal: Negative.   Endocrine: Negative.   Genitourinary: Negative.   Musculoskeletal:  Positive for arthralgias and back pain.  Skin: Negative.   Allergic/Immunologic: Negative.   Neurological: Negative.   Hematological: Negative.   Psychiatric/Behavioral: Negative.    All other systems reviewed and are negative.  Past Medical History:  Diagnosis Date   Bilateral pulmonary embolism (HCC)    lovenox & coumadin thearpy   Chronic cough    DM (diabetes mellitus) ([place])    DVT (deep venous thrombosis) (HCC)    left lower   Gastrointestinal bleed  prior   H/O cardiovascular stress test 05/07/2011   normal study, low risk scan   H/O Doppler ultrasound 2013   venous duplex doppler   H/O Doppler ultrasound 2012   venous duplex doppler   H/O echocardiogram 03/14/2011   EF 65-70%   H/O exercise stress test 2006   neg bruce protocol excercise stress test   Hiatal hernia    Hypertension    Prostate cancer [facility])    s/p radiation    Social History    Socioeconomic History   Marital status: Married    Spouse name: Not on file   Number of children: 3   Years of education: Not on file   Highest education level: Not on file  Occupational History   Not on file  Tobacco Use   Smoking status: Former    Packs/day: 0.75    Years: 20.00    Pack years: 15.00    Types: Cigarettes   Smokeless tobacco: Never  Vaping Use   Vaping Use: Never used  Substance and Sexual Activity   Alcohol use: No   Drug use: No   Sexual activity: Not on file  Other Topics Concern   Not on file  Social History Narrative   Not on file   Social Determinants of Health   Financial Resource Strain: Not on file  Food Insecurity: Not on file  Transportation Needs: Not on file  Physical Activity: Not on file  Stress: Not on file  Social Connections: Not on file  Intimate Partner Violence: Not on file    Past Surgical History:  Procedure Laterality Date   event monitor     2012   HERNIA REPAIR Bilateral    1991, [place]   IVC FILTER INSERTION     PROSTATE SURGERY      Family History  Problem Relation Age of Onset   Stroke Mother    Kidney disease Father    Heart disease Brother    Arthritis Brother    Heart disease Brother    Prostate cancer Brother     Allergies  Allergen Reactions   Lidocaine     ANTI ITCH CREAM, NO SPECIFICATIONS IN RECORDS.   Lisinopril     cough    Current Outpatient Medications on File Prior to Visit  Medication Sig Dispense Refill   acetaminophen (TYLENOL) 500 MG tablet Take 1,000 mg by mouth 2 (two) times daily as needed (pain).     amLODipine (NORVASC) 10 MG tablet Take 1 tablet (10 mg total) by mouth daily. 90 tablet 3   amLODipine (NORVASC) 10 MG tablet      apixaban (ELIQUIS) 5 MG TABS tablet Take 5 mg by mouth 2 (two) times daily.     Ascorbic Acid (VITAMIN C) 1000 MG tablet Take 1,000 mg by mouth daily. Patient reports,1 tablet twice daily     Blood Glucose Monitoring  Suppl (ONE TOUCH ULTRA 2) w/Device KIT USE TO TEST BLOOD GLUCOSE TWICE DAILY 1 each 0   Cholecalciferol (D3-1000 PO) Take 1 capsule by mouth daily.      glucose blood test strip USE TO TEST BLOOD GLUCOSE TWICE DAILY 200 each 3   hydrochlorothiazide (HYDRODIURIL) 25 MG tablet Take 1 tablet (25 mg total) by mouth daily. 90 tablet 3   losartan (COZAAR) 100 MG tablet TAKE ONE TABLET (100 mg total) BY MOUTH DAILY 90 tablet 3   losartan (COZAAR) 100 MG tablet 1 tablet  omeprazole (PRILOSEC) 20 MG capsule Take 20 mg by mouth every other day.     ONE TOUCH ULTRA TEST test strip USE TO TEST BLOOD GLUCOSE TWICE DAILY 200 each 3   ONETOUCH DELICA LANCETS 65K MISC 2 strips by Does not apply route daily. 200 each 3   polyethylene glycol powder (GLYCOLAX/MIRALAX) 17 GM/SCOOP powder TAKE 17 GRAMS BY MOUTH DAILY     potassium chloride (MICRO-K) 10 MEQ CR capsule Take 1 capsule (10 mEq total) by mouth daily. 90 capsule 3   Multiple Vitamins-Minerals (ICAPS AREDS 2 PO) Take 1 tablet by mouth 2 (two) times daily. (Patient not taking: Reported on 07/12/2021)     No current facility-administered medications on file prior to visit.    BP (!) 150/70   Pulse (!) 48   Temp 98.1 F (36.7 C) (Oral)   Ht 5' 5.5" (1.664 m)   Wt 187 lb (84.8 kg)   SpO2 95%   BMI 30.65 kg/m        Objective:   Physical Exam Vitals and nursing note reviewed.  Constitutional:      General: He is not in acute distress.    Appearance: Normal appearance. He is well-developed and normal weight.  HENT:     Head: Normocephalic and atraumatic.     Right Ear: Tympanic membrane, ear canal and external ear normal. There is no impacted cerumen.     Left Ear: Tympanic membrane, ear canal and external ear normal. There is no impacted cerumen.     Nose: Nose normal. No congestion or rhinorrhea.     Mouth/Throat:     Mouth: Mucous membranes are moist.     Pharynx: Oropharynx is clear. No oropharyngeal exudate or posterior oropharyngeal  erythema.  Eyes:     General:        Right eye: No discharge.        Left eye: No discharge.     Extraocular Movements: Extraocular movements intact.     Conjunctiva/sclera: Conjunctivae normal.     Pupils: Pupils are equal, round, and reactive to light.  Neck:     Vascular: No carotid bruit.     Trachea: No tracheal deviation.  Cardiovascular:     Rate and Rhythm: Normal rate and regular rhythm.     Pulses: Normal pulses.     Heart sounds: Normal heart sounds. No murmur heard.   No friction rub. No gallop.  Pulmonary:     Effort: Pulmonary effort is normal. No respiratory distress.     Breath sounds: Normal breath sounds. No stridor. No wheezing, rhonchi or rales.  Chest:     Chest wall: No tenderness.  Abdominal:     General: Bowel sounds are normal. There is no distension.     Palpations: Abdomen is soft. There is no mass.     Tenderness: There is no abdominal tenderness. There is no right CVA tenderness, left CVA tenderness, guarding or rebound.     Hernia: No hernia is present.  Musculoskeletal:        General: No swelling, tenderness, deformity or signs of injury. Normal range of motion.     Right lower leg: No edema.     Left lower leg: No edema.  Lymphadenopathy:     Cervical: No cervical adenopathy.  Skin:    General: Skin is warm and dry.     Capillary Refill: Capillary refill takes less than 2 seconds.     Coloration: Skin is not jaundiced or pale.  Findings: No bruising, erythema, lesion or rash.  Neurological:     General: No focal deficit present.     Mental Status: He is alert and oriented to person, place, and time.     Cranial Nerves: No cranial nerve deficit.     Sensory: No sensory deficit.     Motor: No weakness.     Coordination: Coordination normal.     Gait: Gait normal.     Deep Tendon Reflexes: Reflexes normal.  Psychiatric:        Mood and Affect: Mood normal.        Behavior: Behavior normal.        Thought Content: Thought content  normal.        Judgment: Judgment normal.      Assessment & Plan:   1. Routine general medical examination at a health care facility Remarkable 87 year old male - CBC with Differential/Platelet; Future - Comprehensive metabolic panel; Future - Hemoglobin A1c; Future - Lipid panel; Future - TSH; Future  2. Essential hypertension - Wants to work on diet instead of adding medications - Will see how he does in three months  - CBC with Differential/Platelet; Future - Comprehensive metabolic panel; Future - Hemoglobin A1c; Future - Lipid panel; Future - TSH; Future  3. Diet-controlled diabetes mellitus ([place]) - Wants to work on diet instead of taking mediations  - Follow up in three months  - CBC with Differential/Platelet; Future - Comprehensive metabolic panel; Future - Hemoglobin A1c; Future - Lipid panel; Future - TSH; Future  4. Mixed hyperlipidemia - Continue statin  - CBC with Differential/Platelet; Future - Comprehensive metabolic panel; Future - Hemoglobin A1c; Future - Lipid panel; Future - TSH; Future  5. Pulmonary embolism, unspecified chronicity, unspecified pulmonary embolism type, unspecified whether acute cor pulmonale present ([place]) - Continue eliquis  - CBC with Differential/Platelet; Future - Comprehensive metabolic panel; Future - Hemoglobin A1c; Future - Lipid panel; Future - TSH; Future  6. Cirrhosis of liver without ascites, unspecified hepatic cirrhosis type ([place]) - Continue to see GI  - CBC with Differential/Platelet; Future - Comprehensive metabolic panel; Future - Hemoglobin A1c; Future - Lipid panel; Future - TSH; Future  7. Prostate cancer ([place]) - Continue with follow up at Urology   8. Need for immunization against influenza  - Flu Vaccine QUAD High Dose(Fluad)  [name]

## 2021-07-16 ENCOUNTER — Other Ambulatory Visit: Payer: Self-pay | Admitting: Adult Health

## 2021-07-16 DIAGNOSIS — I10 Essential (primary) hypertension: Secondary | ICD-10-CM

## 2021-07-18 ENCOUNTER — Telehealth: Payer: Self-pay

## 2021-07-18 ENCOUNTER — Other Ambulatory Visit: Payer: Self-pay

## 2021-07-18 MED ORDER — BLOOD GLUCOSE MONITOR KIT: PACK | 0 refills | Status: DC

## 2021-07-18 MED ORDER — LANCETS 28G MISC: 4.0000 | Freq: Four times a day (QID) | 0 refills | Status: DC

## 2021-07-18 NOTE — Telephone Encounter (Signed)
New prescription had to be placed due to insurance no longer covering old glucometer. Pt aware of change and verbalized understanding. No further action needed!

## 2021-07-18 NOTE — Telephone Encounter (Signed)
Daughter of patient called requesting that Rx refill for [facility] DELICA LANCETS 68G MISC and  ONE TOUCH ULTRA TEST test strip  Send to [place]

## 2021-07-24 ENCOUNTER — Telehealth: Payer: Self-pay | Admitting: Pharmacist

## 2021-07-24 NOTE — Chronic Care Management (AMB) (Signed)
    Chronic Care Management Pharmacy Assistant   Name: Dakota Lewis  MRN: [id number] DOB: [birth year]  Reason for Encounter: Reschedule appointment Rescheduled with patient for 11/09/2021 at 10:00  Care Gaps: AWV - completed 04/24/2021 Last BP - 150/70 on 07/12/2021 Last HGA1C - 7.2 on 07/12/2021  Star Rating Drugs: Hyzaar 100-25 mg - last filled  [place] Pharmacist Assistant [phone]

## 2021-08-07 ENCOUNTER — Telehealth: Payer: Self-pay | Admitting: Pharmacist

## 2021-08-07 DIAGNOSIS — I82509 Chronic embolism and thrombosis of unspecified deep veins of unspecified lower extremity: Secondary | ICD-10-CM | POA: Diagnosis not present

## 2021-08-07 DIAGNOSIS — Z7901 Long term (current) use of anticoagulants: Secondary | ICD-10-CM | POA: Diagnosis not present

## 2021-08-07 DIAGNOSIS — K7581 Nonalcoholic steatohepatitis (NASH): Secondary | ICD-10-CM | POA: Diagnosis not present

## 2021-08-07 DIAGNOSIS — I2724 Chronic thromboembolic pulmonary hypertension: Secondary | ICD-10-CM | POA: Diagnosis not present

## 2021-08-07 DIAGNOSIS — D6869 Other thrombophilia: Secondary | ICD-10-CM | POA: Diagnosis not present

## 2021-08-07 DIAGNOSIS — E1142 Type 2 diabetes mellitus with diabetic polyneuropathy: Secondary | ICD-10-CM | POA: Diagnosis not present

## 2021-08-07 DIAGNOSIS — I2782 Chronic pulmonary embolism: Secondary | ICD-10-CM | POA: Diagnosis not present

## 2021-08-07 DIAGNOSIS — I10 Essential (primary) hypertension: Secondary | ICD-10-CM | POA: Diagnosis not present

## 2021-08-07 DIAGNOSIS — K7460 Unspecified cirrhosis of liver: Secondary | ICD-10-CM | POA: Diagnosis not present

## 2021-08-07 DIAGNOSIS — I480 Paroxysmal atrial fibrillation: Secondary | ICD-10-CM | POA: Diagnosis not present

## 2021-08-07 DIAGNOSIS — E1159 Type 2 diabetes mellitus with other circulatory complications: Secondary | ICD-10-CM | POA: Diagnosis not present

## 2021-08-07 DIAGNOSIS — I2510 Atherosclerotic heart disease of native coronary artery without angina pectoris: Secondary | ICD-10-CM | POA: Diagnosis not present

## 2021-08-07 NOTE — Chronic Care Management (AMB) (Signed)
Chronic Care Management Pharmacy Assistant   Name: Dakota Lewis  MRN: [id number] DOB: 1932/12/29  Reason for Encounter: Disease State / Hypertension Assessment Call   Conditions to be addressed/monitored: HTN, DMII, and Prostate cancer   Recent office visits:  07/12/2021 [name] NP (PCP) - Patient was seen for routine general medical exam and additional issues. Started Losartan HCTZ 100/25 mg daily.  Discontinued Guaifenesin and HCTZ. Follow up in 3 months.   Recent consult visits:  None  Hospital visits:  None  Medications: Outpatient Encounter Medications as of 08/07/2021  Medication Sig Note   acetaminophen (TYLENOL) 500 MG tablet Take 1,000 mg by mouth 2 (two) times daily as needed (pain).    amLODipine (NORVASC) 10 MG tablet Take 10 mg by mouth daily.    apixaban (ELIQUIS) 5 MG TABS tablet Take 5 mg by mouth 2 (two) times daily.    Ascorbic Acid (VITAMIN C) 1000 MG tablet Take 1,000 mg by mouth daily. Patient reports,1 tablet twice daily    blood glucose meter kit and supplies KIT Dispense based on patient and insurance preference. Use up to four times daily as directed.    Blood Glucose Monitoring Suppl (ONE TOUCH ULTRA 2) w/Device KIT USE TO TEST BLOOD GLUCOSE TWICE DAILY    Cholecalciferol (D3-1000 PO) Take 1 capsule by mouth daily.  11/25/2019: Patient reports taking 2 tablets in the morning.    glucose blood test strip USE TO TEST BLOOD GLUCOSE TWICE DAILY    Lancets 28G MISC 4 Sticks by Does not apply route in the morning, at noon, in the evening, and at bedtime.    losartan-hydrochlorothiazide (HYZAAR) 100-25 MG tablet Take 1 tablet by mouth daily.    omeprazole (PRILOSEC) 20 MG capsule Take 20 mg by mouth every other day.    ONE TOUCH ULTRA TEST test strip USE TO TEST BLOOD GLUCOSE TWICE DAILY    polyethylene glycol powder (GLYCOLAX/MIRALAX) 17 GM/SCOOP powder TAKE 17 GRAMS BY MOUTH DAILY    potassium chloride (MICRO-K) 10 MEQ CR capsule Take 1 capsule (10  mEq total) by mouth daily.    No facility-administered encounter medications on file as of 08/07/2021.   Fill History: LOSARTAN POTASSIUM 100MG TABLET 07/31/2021 90   POTASSIUM CHLORIDE ER 10MEQ CAPSULE EXTENDED RELEASE 07/31/2021 90   AMLODIPINE BESYLATE 10MG TABLET 07/31/2021 90   HYDROCHLOROTHIAZIDE 25MG TABLET 07/31/2021 90   Reviewed chart prior to disease state call. Spoke with patient regarding BP  Recent Office Vitals: BP Readings from Last 3 Encounters:  07/12/21 (!) 150/70  09/20/20 132/78  07/17/20 (!) 149/75   Pulse Readings from Last 3 Encounters:  07/12/21 (!) 48  09/20/20 95  07/17/20 (!) 51    Wt Readings from Last 3 Encounters:  07/12/21 187 lb (84.8 kg)  09/20/20 195 lb 3.2 oz (88.5 kg)  07/17/20 193 lb 11.2 oz (87.9 kg)     Kidney Function Lab Results  Component Value Date/Time   CREATININE 1.09 07/12/2021 09:44 AM   CREATININE 1.0 06/16/2020 12:00 AM   CREATININE 1.03 07/07/2019 09:20 AM   GFR 61.43 07/12/2021 09:44 AM   GFRNONAA >60 06/16/2020 12:00 AM   GFRAA  03/15/2011 04:00 AM    >60        The eGFR has been calculated using the MDRD equation. This calculation has not been validated in all clinical situations. eGFR's persistently <60 mL/min signify possible Chronic Kidney Disease.    BMP Latest Ref Rng & Units 07/12/2021 06/16/2020 07/07/2019  Glucose 70 - 99 mg/dL 125(H) - 123(H)  BUN 6 - 23 mg/dL 20 32(A) 23  Creatinine 0.40 - 1.50 mg/dL 1.09 1.0 1.03  Sodium 135 - 145 mEq/L 135 135(A) 138  Potassium 3.5 - 5.1 mEq/L 4.3 4.8 4.7  Chloride 96 - 112 mEq/L 99 101 100  CO2 19 - 32 mEq/L 29 30(A) 29  Calcium 8.4 - 10.5 mg/dL 10.0 - 10.2    Current antihypertensive regimen:  Amlodipine 10 mg daily Losartan HCTZ 100/25 mg daily  How often are you checking your Blood Pressure? daily  Current home BP readings:  08/03/2021 - BP 144/65 - P 66 - Non Fast BS 139 08/06/2021 - BP 119/57 - P 55 - Non Fast BS 141 08/07/2021 - BP 134/59 - P  50 - Non Fast BS 142  What recent interventions/DTPs have been made by any provider to improve Blood Pressure control since last CPP Visit: None  Any recent hospitalizations or ED visits since last visit with CPP? No  What diet changes have been made to improve Blood Pressure Control?  Patient has cut out sweets and salty foods Patient will have a country ham biscuit or sausage egg cheese biscuit for breakfast and a sandwich, either banana, tomato or bologna for lunch and dinner, unless his daughter brings him something for dinner.   What exercise is being done to improve your Blood Pressure Control?  Patient is very active daily, he stays busy doing yard work, house work and working in his shop.  Adherence Review: Is the patient currently on ACE/ARB medication? Yes Does the patient have >5 day gap between last estimated fill dates? No  Care Gaps: AWV - completed 04/24/2021 Last BP - 150/70 on 07/12/2021 Last A1C - 7.2 on 07/12/2021 Eye exam - overdue Covid vaccine - overdue  Star Rating Drugs: Losartan 100 mg - last filled  07/31/2021 90 DS at [place] Pharmacist Assistant [phone]

## 2021-08-26 ENCOUNTER — Ambulatory Visit: Payer: Self-pay | Admitting: Pharmacist

## 2021-08-26 DIAGNOSIS — I10 Essential (primary) hypertension: Secondary | ICD-10-CM

## 2021-08-26 DIAGNOSIS — E119 Type 2 diabetes mellitus without complications: Secondary | ICD-10-CM

## 2021-08-26 NOTE — Progress Notes (Signed)
Updated blood pressure in chart with patient-reported home BP:  BP Readings from Last 3 Encounters:  08/07/21 (!) 134/59  07/12/21 (!) 150/70  09/20/20 132/78    Care Plan : [place]  Updates made by Viona Gilmore, [place] since 08/26/2021 12:00 AM     Problem: Problem: Hypertension, Hyperlipidemia, Diabetes, GERD, Osteoarthritis and History of PE, prostate cancer, cirrhosis      Long-Range Goal: Patient-Specific Goal   Start Date: 02/21/2021  Expected End Date: 02/21/2022  Recent Progress: On track  Priority: High  Note:   Current Barriers:  Unable to independently monitor therapeutic efficacy Unable to achieve control of diabetes   Pharmacist Clinical Goal(s):  Patient will achieve adherence to monitoring guidelines and medication adherence to achieve therapeutic efficacy through collaboration with PharmD and provider.   Interventions: 1:1 collaboration with [name], NP regarding development and update of comprehensive plan of care as evidenced by provider attestation and co-signature Inter-disciplinary care team collaboration (see longitudinal plan of care) Comprehensive medication review performed; medication list updated in electronic medical record  Hypertension (BP goal <140/90) -Not ideally controlled -Current treatment: amlodipine 31m, 1 tablet daily - in PM hydrochlorothiazide 251m 1 tablet daily - in AM losartan 10021m1 tablet daily - in AM -Medications previously tried: none  -Current home readings: 08/03/2021 - BP 144/65 - P 66, 08/06/2021 - BP 119/57 - P 55, 08/07/2021 - BP 134/59 - P 50 -Current dietary habits: patient doesn't eat out often and does not eat frozen foods; his breakfast consists of a jimmy dean sausage biscuit -Current exercise habits: 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 Consider combination BP medication for losartan-HCTZ.  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 -Educated on Cholesterol goals;  -Counseled on diet and exercise extensively  Diabetes (A1c goal <7%) -Controlled -Current medications: No medications -Medications previously tried: none  -Current home glucose readings fasting glucose: 139, 141 post prandial glucose: does not check at home -Denies hypoglycemic/hyperglycemic symptoms -Current meal patterns:  breakfast: did not discuss  lunch: did not discuss   dinner: did not discuss  snacks: did not discuss  drinks: did not discuss  -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 -Educated on A1c and blood sugar goals; Carbohydrate counting and/or plate method -Counseled to check feet daily and get yearly eye exams -Counseled on diet and exercise extensively  History of PE (Goal: prevent blood clots) -Controlled -Current treatment  Eliquis 5mg42m tablet twice daily -Medications previously tried: warfarin -Recommended to continue current medication  GERD (Goal: minimize symptoms of heartburn/acid reflux) -Controlled -Current treatment  Omeprazole 20mg44mcapsule daily -Medications previously tried: none  -Counseled on non-pharmacologic management of symptoms such as elevating the head of your bed, avoiding eating 2-3 hours before bed, avoiding  triggering foods such as acidic, spicy, or fatty foods, eating smaller meals, and wearing clothes that are loose around the waist Recommended decreasing to  omeprazole 1 capsule every other day.  Osteoarthritis (Goal: minimize pain) -Controlled -Current treatment  Tylenol 560m, 2 tablets twice daily as needed for pain CBD oil as needed -Medications previously tried: none  -Recommended to continue current medication Counseled on maximum daily amount of Tylenol of 20012mas patient has cirrhosis.  Health Maintenance -Vaccine gaps: none -Current therapy:  Vitamin C 1000 mg 1 tablet once daily  Calcium 600 mg 1 tablet twice a daily Vitamin D 5000 units - 2 tablets daily -Educated on Herbal supplement research is limited and benefits usually cannot be proven Cost vs benefit of each product must be carefully weighed by individual consumer Supplements may interfere with prescription drugs -Patient is satisfied with current therapy and denies issues -Recommended to continue current medication Recommended stopping calcium based on lack of indication.  Patient Goals/Self-Care Activities Patient will:  - take medications as prescribed check blood pressure weekly, document, and provide at future appointments target a minimum of 150 minutes of moderate intensity exercise weekly  Follow Up Plan: Telephone follow up appointment with care management team member scheduled for: 4 months       MaViona GilmoreRPWestern [facility]

## 2021-09-17 DIAGNOSIS — K7581 Nonalcoholic steatohepatitis (NASH): Secondary | ICD-10-CM | POA: Diagnosis not present

## 2021-09-17 DIAGNOSIS — K7460 Unspecified cirrhosis of liver: Secondary | ICD-10-CM | POA: Diagnosis not present

## 2021-09-17 DIAGNOSIS — R194 Change in bowel habit: Secondary | ICD-10-CM | POA: Diagnosis not present

## 2021-09-18 ENCOUNTER — Other Ambulatory Visit: Payer: Self-pay | Admitting: Nurse Practitioner

## 2021-09-18 DIAGNOSIS — K7581 Nonalcoholic steatohepatitis (NASH): Secondary | ICD-10-CM

## 2021-09-18 DIAGNOSIS — K7460 Unspecified cirrhosis of liver: Secondary | ICD-10-CM

## 2021-09-26 ENCOUNTER — Telehealth: Payer: Self-pay | Admitting: Adult Health

## 2021-09-26 DIAGNOSIS — R059 Cough, unspecified: Secondary | ICD-10-CM | POA: Diagnosis not present

## 2021-09-26 DIAGNOSIS — J310 Chronic rhinitis: Secondary | ICD-10-CM | POA: Diagnosis not present

## 2021-09-26 NOTE — Telephone Encounter (Signed)
Patient calling in with respiratory symptoms: Shortness of breath, chest pain, palpitations or other red words send to Triage  Does the patient have a fever over 100, cough, congestion, sore throat, runny nose, lost of taste/smell (please list symptoms that patient has)?sore throat and ear discomfort when did symptoms start?09-25-2021 (If over 5 days ago, pt may be scheduled for in person visit)  Have you tested for Covid in the last 5 days? No   If yes, was it positive []  OR negative [] ? If positive in the last 5 days, please schedule virtual visit now. If negative, schedule for an in person OV with the next available provider if PCP has no openings. Please also let patient know they will be tested again (follow the script below)  "you will have to arrive 10mns prior to your appt time to be Covid tested. Please park in back of office at the [facility] & call [phone] let the staff know you have arrived. A staff member will meet you at your car to do a rapid covid test. Once the test has resulted you will be notified by phone of your results to determine if appt will remain an in person visit or be converted to a virtual/phone visit. If you arrive less than 35ms before your appt time, your visit will be automatically converted to virtual & any recommended testing will happen AFTER the visit." Pt has appt with cory 09-27-2021 at 830 am  [place] no availability for virtual visit in office,  please schedule another [place] office  If no availability at another [place], please instruct patient that they can schedule an evisit or virtual visit through their mychart account. Visits up to 8pm  patients can be seen in office 5 days after positive COVID test

## 2021-09-27 ENCOUNTER — Telehealth: Payer: Self-pay | Admitting: Adult Health

## 2021-09-27 ENCOUNTER — Telehealth (INDEPENDENT_AMBULATORY_CARE_PROVIDER_SITE_OTHER): Payer: PPO | Admitting: Adult Health

## 2021-09-27 ENCOUNTER — Other Ambulatory Visit: Payer: Self-pay | Admitting: Adult Health

## 2021-09-27 ENCOUNTER — Other Ambulatory Visit: Payer: Self-pay

## 2021-09-27 DIAGNOSIS — R059 Cough, unspecified: Secondary | ICD-10-CM | POA: Diagnosis not present

## 2021-09-27 DIAGNOSIS — J069 Acute upper respiratory infection, unspecified: Secondary | ICD-10-CM

## 2021-09-27 DIAGNOSIS — U071 COVID-19: Secondary | ICD-10-CM | POA: Diagnosis not present

## 2021-09-27 DIAGNOSIS — R0902 Hypoxemia: Secondary | ICD-10-CM | POA: Diagnosis not present

## 2021-09-27 DIAGNOSIS — Z5321 Procedure and treatment not carried out due to patient leaving prior to being seen by health care provider: Secondary | ICD-10-CM | POA: Diagnosis not present

## 2021-09-27 MED ORDER — PREDNISONE 10 MG PO TABS: 10.0000 mg | ORAL_TABLET | Freq: Every day | ORAL | 0 refills | Status: DC

## 2021-09-27 MED ORDER — MOLNUPIRAVIR EUA 200MG CAPSULE: 4.0000 | ORAL_CAPSULE | Freq: Two times a day (BID) | ORAL | 0 refills | Status: AC

## 2021-09-27 NOTE — Telephone Encounter (Signed)
Pt daughter nancy is calling and pt took at home covid test an the results is  positive  [place], [place] Phone:  [phone]  Fax:  [phone]

## 2021-09-27 NOTE — Telephone Encounter (Signed)
Spoke to pt's daughter [name], she was wondering since her father tested positive, how many days back should she be notifying the people he has been around?

## 2021-09-27 NOTE — Progress Notes (Signed)
Virtual Visit via Video Note  I connected withNAME@ on 09/27/21 at  8:30 AM EST by a video enabled telemedicine application and verified that I am speaking with the correct person using two identifiers.  Location patient: home Location provider:work or home office Persons participating in the virtual visit: patient, provider, daughter  I discussed the limitations of evaluation and management by telemedicine and the availability of in person appointments. The patient expressed understanding and agreed to proceed.   HPI: 87 year old male who is being evaluated today for an acute issue.  His symptoms started roughly 4 days ago and seem to be getting progressively worse.  Symptoms include subjective fever, chills, productive cough, wheezing, sinus pain and pressure, body aches, and bilateral ear pain.  He has not tested for COVID yet but is vaccinated.   ROS: See pertinent positives and negatives per HPI.  Past Medical History:  Diagnosis Date   Bilateral pulmonary embolism (HCC)    lovenox & coumadin thearpy   Chronic cough    DM (diabetes mellitus) ([place])    DVT (deep venous thrombosis) (HCC)    left lower   Gastrointestinal bleed    prior   H/O cardiovascular stress test 05/07/2011   normal study, low risk scan   H/O Doppler ultrasound 2013   venous duplex doppler   H/O Doppler ultrasound 2012   venous duplex doppler   H/O echocardiogram 03/14/2011   EF 65-70%   H/O exercise stress test 2006   neg bruce protocol excercise stress test   Hiatal hernia    Hypertension    Prostate cancer [facility])    s/p radiation    Past Surgical History:  Procedure Laterality Date   event monitor     2012   HERNIA REPAIR Bilateral    1991, [place]   IVC FILTER INSERTION     PROSTATE SURGERY      Family History  Problem Relation Age of Onset   Stroke Mother    Kidney disease Father    Heart disease Brother    Arthritis Brother    Heart disease Brother     Prostate cancer Brother        Current Outpatient Medications:    acetaminophen (TYLENOL) 500 MG tablet, Take 1,000 mg by mouth 2 (two) times daily as needed (pain)., Disp: , Rfl:    amLODipine (NORVASC) 10 MG tablet, Take 10 mg by mouth daily., Disp: , Rfl:    apixaban (ELIQUIS) 5 MG TABS tablet, Take 5 mg by mouth 2 (two) times daily., Disp: , Rfl:    Ascorbic Acid (VITAMIN C) 1000 MG tablet, Take 1,000 mg by mouth daily. Patient reports,1 tablet twice daily, Disp: , Rfl:    blood glucose meter kit and supplies KIT, Dispense based on patient and insurance preference. Use up to four times daily as directed., Disp: 1 each, Rfl: 0   Blood Glucose Monitoring Suppl (ONE TOUCH ULTRA 2) w/Device KIT, USE TO TEST BLOOD GLUCOSE TWICE DAILY, Disp: 1 each, Rfl: 0   Cholecalciferol (D3-1000 PO), Take 1 capsule by mouth daily. , Disp: , Rfl:    glucose blood test strip, USE TO TEST BLOOD GLUCOSE TWICE DAILY, Disp: 200 each, Rfl: 3   Lancets 28G MISC, 4 Sticks by Does not apply route in the morning, at noon, in the evening, and at bedtime., Disp: 100 each, Rfl: 0   losartan-hydrochlorothiazide (HYZAAR) 100-25 MG tablet, Take 1 tablet by mouth daily.,  Disp: 90 tablet, Rfl: 3   omeprazole (PRILOSEC) 20 MG capsule, Take 20 mg by mouth every other day., Disp: , Rfl:    ONE TOUCH ULTRA TEST test strip, USE TO TEST BLOOD GLUCOSE TWICE DAILY, Disp: 200 each, Rfl: 3   polyethylene glycol powder (GLYCOLAX/MIRALAX) 17 GM/SCOOP powder, TAKE 17 GRAMS BY MOUTH DAILY, Disp: , Rfl:    potassium chloride (MICRO-K) 10 MEQ CR capsule, Take 1 capsule (10 mEq total) by mouth daily., Disp: 90 capsule, Rfl: 3  EXAM:  VITALS per patient if applicable:  GENERAL: alert, oriented, appears well and in no acute distress  HEENT: atraumatic, conjunttiva clear, no obvious abnormalities on inspection of external nose and ears  NECK: normal movements of the head and neck  LUNGS: on inspection no signs of respiratory distress,  breathing rate appears normal, no obvious gross SOB, gasping or wheezing  CV: no obvious cyanosis  MS: moves all visible extremities without noticeable abnormality  PSYCH/NEURO: pleasant and cooperative, no obvious depression or anxiety, speech and thought processing grossly intact  ASSESSMENT AND PLAN:  Discussed the following assessment and plan:  1. Upper respiratory tract infection, unspecified type -His daughter will go to the pharmacy this morning and get a rapid COVID test she will let me know the results.  If positive then we will treat for COVID-19 if negative will treat with Augmentin and prednisone for upper respiratory tract infection     I discussed the assessment and treatment plan with the patient. The patient was provided an opportunity to ask questions and all were answered. The patient agreed with the plan and demonstrated an understanding of the instructions.   The patient was advised to call back or seek an in-person evaluation if the symptoms worsen or if the condition fails to improve as anticipated.   [name], NP

## 2021-09-28 ENCOUNTER — Other Ambulatory Visit: Payer: Self-pay | Admitting: Adult Health

## 2021-09-28 ENCOUNTER — Telehealth: Payer: Self-pay | Admitting: Adult Health

## 2021-09-28 MED ORDER — ALBUTEROL SULFATE HFA 108 (90 BASE) MCG/ACT IN AERS: 2.0000 | INHALATION_SPRAY | Freq: Four times a day (QID) | RESPIRATORY_TRACT | 0 refills | Status: DC | PRN

## 2021-09-28 MED ORDER — DOXYCYCLINE HYCLATE 100 MG PO CAPS: 100.0000 mg | ORAL_CAPSULE | Freq: Two times a day (BID) | ORAL | 0 refills | Status: DC

## 2021-09-28 NOTE — Telephone Encounter (Signed)
FYI

## 2021-09-28 NOTE — Telephone Encounter (Signed)
Dakota Lewis is calling to let cory know her dad was taken to er last night. Pt oxygen level was down at [facility] his oxygen was 93 and he is not eligible for oxygen. Pt oxygen level has been running around 88-89 at nightl. Pt daughter would like cory to return her call. Pt daughter is aware today cory will not be working a full day

## 2021-10-02 DIAGNOSIS — J310 Chronic rhinitis: Secondary | ICD-10-CM | POA: Diagnosis not present

## 2021-10-04 ENCOUNTER — Other Ambulatory Visit: Payer: PPO

## 2021-10-11 ENCOUNTER — Ambulatory Visit: Payer: PPO | Admitting: Adult Health

## 2021-10-12 ENCOUNTER — Ambulatory Visit: Payer: PPO | Admitting: Adult Health

## 2021-10-18 ENCOUNTER — Ambulatory Visit (INDEPENDENT_AMBULATORY_CARE_PROVIDER_SITE_OTHER): Payer: Medicare HMO | Admitting: Adult Health

## 2021-10-18 ENCOUNTER — Encounter: Payer: Self-pay | Admitting: Adult Health

## 2021-10-18 ENCOUNTER — Other Ambulatory Visit: Payer: PPO

## 2021-10-18 VITALS — BP 130/60 | HR 69 | Temp 97.4°F | Ht 65.5 in | Wt 183.0 lb

## 2021-10-18 DIAGNOSIS — E119 Type 2 diabetes mellitus without complications: Secondary | ICD-10-CM

## 2021-10-18 DIAGNOSIS — I10 Essential (primary) hypertension: Secondary | ICD-10-CM | POA: Diagnosis not present

## 2021-10-18 LAB — POCT GLYCOSYLATED HEMOGLOBIN (HGB A1C): Hemoglobin A1C: 6.6 % — AB (ref 4.0–5.6)

## 2021-10-18 MED ORDER — GLUCOSE BLOOD VI STRP: ORAL_STRIP | 3 refills | Status: DC

## 2021-10-18 MED ORDER — LANCETS 28G MISC: 1.0000 | Freq: Every day | 3 refills | Status: DC

## 2021-10-18 NOTE — Progress Notes (Signed)
Subjective:    Patient ID: Dakota Lewis, male    DOB: 1933-03-15, 88 y.o.   MRN: [id number]  HPI 88 year old male who  has a past medical history of Bilateral pulmonary embolism ([place]), Chronic cough, DM (diabetes mellitus) ([place]), DVT (deep venous thrombosis) ([place]), Gastrointestinal bleed, H/O cardiovascular stress test (05/07/2011), H/O Doppler ultrasound (2013), H/O Doppler ultrasound (2012), H/O echocardiogram (03/14/2011), H/O exercise stress test (2006), Hiatal hernia, Hypertension, and Prostate cancer ([place]).  He presents to the office today for three month follow up regarding DM and HTN   DM - has been diet controlled. During his CPE in October 2022 his A1c had increased past his baseline to 7.3. He was advised to work on diet and exercise before starting medications. His BS at home have been between 100-130. He has cut back on sweets   HTN - Managed with norvasc 10 mg daily, HCTZ 25 mg daily, and losartan 100 mg daily. He does not monitor his BP at home and reports readings consistently in the 130-140's/60-70's. He denies dizziness, lightheadedness, blurred vision, or chest pain    Review of Systems See HPI   Past Medical History:  Diagnosis Date   Bilateral pulmonary embolism (HCC)    lovenox & coumadin thearpy   Chronic cough    DM (diabetes mellitus) ([place])    DVT (deep venous thrombosis) (HCC)    left lower   Gastrointestinal bleed    prior   H/O cardiovascular stress test 05/07/2011   normal study, low risk scan   H/O Doppler ultrasound 2013   venous duplex doppler   H/O Doppler ultrasound 2012   venous duplex doppler   H/O echocardiogram 03/14/2011   EF 65-70%   H/O exercise stress test 2006   neg bruce protocol excercise stress test   Hiatal hernia    Hypertension    Prostate cancer [facility])    s/p radiation    Social History   Socioeconomic History   Marital status: Married    Spouse name: Not on file   Number of children: 3   Years of education: Not on file    Highest education level: Not on file  Occupational History   Not on file  Tobacco Use   Smoking status: Former    Packs/day: 0.75    Years: 20.00    Pack years: 15.00    Types: Cigarettes   Smokeless tobacco: Never  Vaping Use   Vaping Use: Never used  Substance and Sexual Activity   Alcohol use: No   Drug use: No   Sexual activity: Not on file  Other Topics Concern   Not on file  Social History Narrative   Not on file   Social Determinants of Health   Financial Resource Strain: Not on file  Food Insecurity: Not on file  Transportation Needs: Not on file  Physical Activity: Not on file  Stress: Not on file  Social Connections: Not on file  Intimate Partner Violence: Not on file    Past Surgical History:  Procedure Laterality Date   event monitor     2012   HERNIA REPAIR Bilateral    1991, [place]   IVC FILTER INSERTION     PROSTATE SURGERY      Family History  Problem Relation Age of Onset   Stroke Mother    Kidney disease Father    Heart disease Brother    Arthritis Brother  Heart disease Brother    Prostate cancer Brother     Allergies  Allergen Reactions   Lidocaine     ANTI ITCH CREAM, NO SPECIFICATIONS IN RECORDS.   Lisinopril     cough    Current Outpatient Medications on File Prior to Visit  Medication Sig Dispense Refill   acetaminophen (TYLENOL) 500 MG tablet Take 1,000 mg by mouth 2 (two) times daily as needed (pain).     amLODipine (NORVASC) 10 MG tablet Take 10 mg by mouth daily.     apixaban (ELIQUIS) 5 MG TABS tablet Take 5 mg by mouth 2 (two) times daily.     Ascorbic Acid (VITAMIN C) 1000 MG tablet Take 1,000 mg by mouth daily. Patient reports,1 tablet twice daily     blood glucose meter kit and supplies KIT Dispense based on patient and insurance preference. Use up to four times daily as directed. 1 each 0   Blood Glucose Monitoring Suppl (ONE TOUCH ULTRA 2) w/Device KIT USE TO TEST BLOOD GLUCOSE  TWICE DAILY 1 each 0   losartan-hydrochlorothiazide (HYZAAR) 100-25 MG tablet Take 1 tablet by mouth daily. 90 tablet 3   omeprazole (PRILOSEC) 20 MG capsule Take 20 mg by mouth every other day.     polyethylene glycol powder (GLYCOLAX/MIRALAX) 17 GM/SCOOP powder TAKE 17 GRAMS BY MOUTH DAILY     potassium chloride (MICRO-K) 10 MEQ CR capsule Take 1 capsule (10 mEq total) by mouth daily. 90 capsule 3   predniSONE (DELTASONE) 10 MG tablet Take 1 tablet (10 mg total) by mouth daily with breakfast. 5 tablet 0   No current facility-administered medications on file prior to visit.    BP 130/60    Pulse 69    Temp (!) 97.4 F (36.3 C) (Oral)    Ht 5' 5.5" (1.664 m)    Wt 183 lb (83 kg)    SpO2 96%    BMI 29.99 kg/m       Objective:   Physical Exam Vitals and nursing note reviewed.  Constitutional:      Appearance: Normal appearance.  Cardiovascular:     Rate and Rhythm: Normal rate and regular rhythm.     Pulses: Normal pulses.     Heart sounds: Normal heart sounds.  Pulmonary:     Effort: Pulmonary effort is normal.     Breath sounds: Normal breath sounds.  Musculoskeletal:        General: Normal range of motion.  Skin:    General: Skin is warm and dry.     Capillary Refill: Capillary refill takes less than 2 seconds.  Neurological:     General: No focal deficit present.     Mental Status: He is alert and oriented to person, place, and time.  Psychiatric:        Mood and Affect: Mood normal.        Behavior: Behavior normal.        Thought Content: Thought content normal.        Judgment: Judgment normal.      Assessment & Plan:   1. Diet-controlled diabetes mellitus ([place])  - POC HgB A1c- 6.6  - Has improved and at goal   2. Essential hypertension - Well controlled.  - No change in medications   [name], NP

## 2021-10-19 ENCOUNTER — Ambulatory Visit
Admission: RE | Admit: 2021-10-19 | Discharge: 2021-10-19 | Disposition: A | Discharge status: Home / Self Care | Payer: Medicare HMO | Source: Ambulatory Visit | Attending: Nurse Practitioner | Admitting: Nurse Practitioner

## 2021-10-19 DIAGNOSIS — N281 Cyst of kidney, acquired: Secondary | ICD-10-CM | POA: Diagnosis not present

## 2021-10-19 DIAGNOSIS — K7581 Nonalcoholic steatohepatitis (NASH): Secondary | ICD-10-CM | POA: Diagnosis not present

## 2021-10-19 DIAGNOSIS — K7460 Unspecified cirrhosis of liver: Secondary | ICD-10-CM

## 2021-10-19 DIAGNOSIS — R194 Change in bowel habit: Secondary | ICD-10-CM | POA: Diagnosis not present

## 2021-11-01 ENCOUNTER — Telehealth: Payer: PPO

## 2021-11-07 ENCOUNTER — Telehealth: Payer: Self-pay | Admitting: Pharmacist

## 2021-11-07 NOTE — Chronic Care Management (AMB) (Signed)
° ° °  Chronic Care Management Pharmacy Assistant   Name: Dakota Lewis  MRN: [id number] DOB: 28-May-1933  11/09/2021 APPOINTMENT REMINDER   Dakota Lewis was reminded to have all medications, supplements and any blood glucose and blood pressure readings available for review with Dakota Lewis, Pharm. D, at his telephone visit on 11/09/2021 at 10:00.   Questions: Have you had any recent office visit or specialist visit outside of [place]? Patient states he went to the ER in [place] in December 2022 for Covid.  Are there any concerns you would like to discuss during your office visit? Pt denies any concerns at this time  Are you having any problems obtaining your medications? (Whether it pharmacy issues or cost) Patient denies any issues getting his medications  If patient has any PAP medications ask if they are having any problems getting their PAP medication or refill? Patient denies any medications filled via PAP.  Care Gaps: AWV - completed 04/24/2021 Last BP - 130/60 on 10/18/2021 Last A1C - 6.6 on 10/18/2021 Eye exam - overdue Covid booster - overdue  Star Rating Drug: Losartan 100 mg - last filled  11/03/2021 90 DS at [facility] Drug verified with [name]  Any gaps in medications fill history? No  Gennie Alma [facility]  Catering manager [phone]

## 2021-11-09 ENCOUNTER — Ambulatory Visit (INDEPENDENT_AMBULATORY_CARE_PROVIDER_SITE_OTHER): Payer: Medicare HMO | Admitting: Pharmacist

## 2021-11-09 DIAGNOSIS — E119 Type 2 diabetes mellitus without complications: Secondary | ICD-10-CM

## 2021-11-09 DIAGNOSIS — I10 Essential (primary) hypertension: Secondary | ICD-10-CM

## 2021-11-09 NOTE — Progress Notes (Signed)
Chronic Care Management Pharmacy Note  11/09/2021 Name:  Dakota Lewis MRN:  [id number] DOB:  21-Dec-1932  Summary: A1c at goal < 7% BP at goal < 140/90   Recommendations/Changes made from today's visit: -Recommended cutting out [place]. Dew for blood sugar lowering and improvement of GERD symptoms -Deactivated losartan and HCTZ separate rxs at pharmacy as they continued to fill them separately   Plan: BP and DM assessment in 3 months Follow up in 6 months  Subjective: Dakota Lewis is an 88 y.o. year old male who is a primary patient of [name], NP.  The CCM team was consulted for assistance with disease management and care coordination needs.    Engaged with patient by telephone for follow up visit in response to provider referral for pharmacy case management and/or care coordination services.   Consent to Services:  The patient was given information about Chronic Care Management services, agreed to services, and gave verbal consent prior to initiation of services.  Please see initial visit note for detailed documentation.   Patient Care Team: [name], NP as PCP - General (Family Medicine) Franchot Gallo, MD as Consulting Physician (Urology) Viona Gilmore, [facility] as Pharmacist (Pharmacist)  Recent office visits: 10/18/21 [name], NP: Patient presented for DM and HTN follow up.  09/27/21 [name], NP: Patient presented for video visit for possible COVID infection. Recommended testing at the pharmacy.  07/12/21 [name], NP: Patient presented for annual exam. A1c increased to 7.3%. Started Losartan HCTZ 100/25 mg daily.  Discontinued Guaifenesin and HCTZ. Follow up in 3 months.   Recent consult visits: 10/23/21 Candis Schatz ([facility]iology [facility]): Patient presented for hearing aid check.  09/17/21 Roosevelt Locks, NP (transplant): Patient presented for NASH follow up.   06/15/21 [facility]: Patient presented for routine chronic conditions follow up. Lab work: A1c  7.3, Mag 1.6, TGs 211, microalbumin/creatinine 79.2.  05/01/21 Noelle Penner [facility]): Patient presented for eye exam follow up.   Hospital visits: 09/27/21 Patient presented to the ED for [place]. Unable to access notes.  Objective:  Lab Results  Component Value Date   CREATININE 1.09 07/12/2021   BUN 20 07/12/2021   GFR 61.43 07/12/2021   GFRNONAA >60 06/16/2020   GFRAA  03/15/2011    >60        The eGFR has been calculated using the MDRD equation. This calculation has not been validated in all clinical situations. eGFR's persistently <60 mL/min signify possible Chronic Kidney Disease.   NA 135 07/12/2021   K 4.3 07/12/2021   CALCIUM 10.0 07/12/2021   CO2 29 07/12/2021   GLUCOSE 125 (H) 07/12/2021    Lab Results  Component Value Date/Time   HGBA1C 6.6 (A) 10/18/2021 02:05 PM   HGBA1C 7.2 (H) 07/12/2021 09:44 AM   HGBA1C 6.8 06/16/2020 12:00 AM   HGBA1C 6.3 07/07/2019 09:20 AM   GFR 61.43 07/12/2021 09:44 AM   GFR 68.71 07/07/2019 09:20 AM    Last diabetic Eye exam:  Lab Results  Component Value Date/Time   HMDIABEYEEXA No Retinopathy 04/06/2016 12:00 AM    Last diabetic Foot exam:  Lab Results  Component Value Date/Time   HMDIABFOOTEX Abstracted/Dr. Marily Memos 12/19/2016 12:00 AM     Lab Results  Component Value Date   CHOL 158 07/12/2021   HDL 53.70 07/12/2021   LDLCALC 67 07/12/2021   LDLDIRECT 100.0 07/07/2019   TRIG 188.0 (H) 07/12/2021   CHOLHDL 3 07/12/2021    Hepatic Function Latest Ref Rng & Units  07/12/2021 06/16/2020 07/07/2019  Total Protein 6.0 - 8.3 g/dL 7.0 - 7.2  Albumin 3.5 - 5.2 g/dL 4.3 3.9 4.4  AST 0 - 37 U/L _0 ALT 0 - 53 U/L 31 49(A) 25  Alk Phosphatase 39 - 117 U/L 70 - 76  Total Bilirubin 0.2 - 1.2 mg/dL 0.8 - 0.7  Bilirubin, Direct 0.01 - 0.4 - 0.2 -    Lab Results  Component Value Date/Time   TSH 0.78 07/12/2021 09:44 AM   TSH 1.21 06/16/2020 12:00 AM   TSH 1.15 07/07/2019 09:20 AM    CBC Latest Ref Rng & Units  07/12/2021 06/16/2020 09/24/2019  WBC 4.0 - 10.5 K/uL 4.6 6.7 6.4  Hemoglobin 13.0 - 17.0 g/dL 13.1 14.1 13.3  Hematocrit 39.0 - 52.0 % 39.1 42 39.9  Platelets 150.0 - 400.0 K/uL 200.0 196 191.0    Lab Results  Component Value Date/Time   VD25OH 58.53 05/25/2018 08:24 AM   VD25OH 48.5 03/28/2017 12:00 AM    Clinical ASCVD: No  The ASCVD Risk score (Arnett DK, et al., 2019) failed to calculate for the following reasons:   The 2019 ASCVD risk score is only valid for ages 20 to 1    Depression screen PHQ 2/9 10/18/2021 07/12/2021 07/07/2020  Decreased Interest 0 0 0  Down, Depressed, Hopeless 0 0 0  PHQ - 2 Score 0 0 0  Altered sleeping 2 - -  Tired, decreased energy 1 - -  Change in appetite 2 - -  Feeling bad or failure about yourself  0 - -  Trouble concentrating 0 - -  Moving slowly or fidgety/restless 0 - -  Suicidal thoughts 0 - -  PHQ-9 Score 5 - -  Difficult doing work/chores Not difficult at all - -     Social History   Tobacco Use  Smoking Status Former   Packs/day: 0.75   Years: 20.00   Pack years: 15.00   Types: Cigarettes  Smokeless Tobacco Never   BP Readings from Last 3 Encounters:  10/18/21 130/60  08/07/21 (!) 134/59  07/12/21 (!) 150/70   Pulse Readings from Last 3 Encounters:  10/18/21 69  07/12/21 (!) 48  09/20/20 95   Wt Readings from Last 3 Encounters:  10/18/21 183 lb (83 kg)  07/12/21 187 lb (84.8 kg)  09/20/20 195 lb 3.2 oz (88.5 kg)   BMI Readings from Last 3 Encounters:  10/18/21 29.99 kg/m  07/12/21 30.65 kg/m  09/20/20 30.57 kg/m    Assessment/Interventions: Review of patient past medical history, allergies, medications, health status, including review of consultants reports, laboratory and other test data, was performed as part of comprehensive evaluation and provision of chronic care management services.   SDOH:  (Social Determinants of Health) assessments and interventions performed: No  SDOH Screenings   Alcohol Screen:  Not on file  Depression (PHQ2-9): Medium Risk   PHQ-2 Score: 5  Financial Resource Strain: Not on file  Food Insecurity: Not on file  Housing: Not on file  Physical Activity: Not on file  Social Connections: Not on file  Stress: Not on file  Tobacco Use: Medium Risk   Smoking Tobacco Use: Former   Smokeless Tobacco Use: Never   Passive Exposure: Not on file  Transportation Needs: Not on file    [place]  Allergies  Allergen Reactions   Lidocaine     ANTI [place].   Lisinopril     cough  Medications Reviewed Today     Reviewed by Viona Gilmore, [facility] (Pharmacist) on 11/09/21 at 1003  Med List Status: <None>   Medication Order Taking? Sig Documenting Provider Last Dose Status Informant  acetaminophen (TYLENOL) 500 MG tablet [id number] No Take 1,000 mg by mouth 2 (two) times daily as needed (pain). [provider] Taking Active Self  amLODipine (NORVASC) 10 MG tablet [id number] No Take 10 mg by mouth daily. [provider] Taking Active   apixaban (ELIQUIS) 5 MG TABS tablet [id number] No Take 5 mg by mouth 2 (two) times daily. [provider] Taking Active   Ascorbic Acid (VITAMIN C) 1000 MG tablet [id number] No Take 1,000 mg by mouth daily. Patient reports,1 tablet twice daily [provider] Taking Active Self  blood glucose meter kit and supplies KIT [id number] No Dispense based on patient and insurance preference. Use up to four times daily as directed. Nafziger, Tommi Rumps, NP Taking Active   Blood Glucose Monitoring Suppl (ONE TOUCH ULTRA 2) w/Device KIT [id number] No USE TO TEST BLOOD GLUCOSE TWICE DAILY Nafziger, Tommi Rumps, NP Taking Active   glucose blood test strip [id number]  Use as instructed [name], NP  Active   Lancets 28G [place] [id number]  1 Stick by Does not apply route daily. Nafziger, Tommi Rumps, NP  Active   losartan-hydrochlorothiazide (HYZAAR) 100-25 MG tablet [id number] No Take 1 tablet by mouth daily.  Nafziger, Tommi Rumps, NP Taking Active   omeprazole (PRILOSEC) 20 MG capsule [id number] No Take 20 mg by mouth every other day. [provider] Taking Active Self           Med Note Kipp Brood, [place] Nase G   Wed Jul 04, 2021  4:28 PM)    polyethylene glycol powder (GLYCOLAX/MIRALAX) 17 GM/SCOOP powder [id number] No TAKE 17 GRAMS BY MOUTH DAILY [provider] Taking Active   potassium chloride (MICRO-K) 10 MEQ CR capsule [id number] No Take 1 capsule (10 mEq total) by mouth daily. Nafziger, Tommi Rumps, NP Taking Active   predniSONE (DELTASONE) 10 MG tablet [id number] No Take 1 tablet (10 mg total) by mouth daily with breakfast. [name], NP Taking Active             Patient Active Problem List   Diagnosis Date Noted   Diet-controlled diabetes mellitus ([place]) 12/30/2017   Atypical chest pain 11/28/2017   Pulmonary embolism ([place]) 07/09/2017   Prostate cancer ([place])    Hypertension     Immunization History  Administered Date(s) Administered   Fluad Quad(high Dose 65+) 07/07/2019, 07/07/2020, 07/12/2021   Influenza Whole 07/18/2017   Influenza, High Dose Seasonal PF 07/06/2013, 07/22/2014, 06/07/2016, 05/07/2017, 07/21/2017   Influenza,inj,Quad PF,6+ Mos 07/18/2017, 07/22/2018   Influenza-Unspecified 07/13/2001, 07/07/2002, 07/08/2003, 08/02/2004, 07/07/2005, 07/26/2005, 08/07/2006, 07/23/2007, 06/07/2008, 07/07/2009, 07/08/2011, 07/07/2012   Moderna SARS-COV2 Booster Vaccination 09/08/2020   Moderna Sars-Covid-2 Vaccination 10/27/2019, 11/24/2019, 09/08/2020   Pneumococcal Conjugate-13 10/21/2013   Pneumococcal Polysaccharide-23 12/19/2016   Pneumococcal-Unspecified 05/22/2002, 10/21/2013   Td 08/02/2014   Tdap 09/19/2012   Zoster Recombinat (Shingrix) 07/17/2019, 12/11/2019, 07/16/2020   Zoster, Live 11/02/2012   Patient reported he was in bad shape with COVID. He is still coughing some but this has significantly improved. He isn't moving around as much as he was pre-COVID but  this is mostly due to the weather. He was feeling pretty weak with COVID and couldn't eat for a couple of weeks.  Patient reports he cut out caffeine all together but was unaware that [place]. Dew has caffeine in it. He has been  drinking 3-4 cans per day and will cut this out.  Conditions to be addressed/monitored:  Hypertension, Hyperlipidemia, Diabetes, GERD, Osteoarthritis and History of PE, prostate cancer, cirrhosis  Conditions addressed this visit: Hypertension, GERD, diabetes  Care Plan : CCM Pharmacy Care Plan  Updates made by Viona Gilmore, [place] since 11/09/2021 12:00 AM     Problem: Problem: Hypertension, Hyperlipidemia, Diabetes, GERD, Osteoarthritis and History of PE, prostate cancer, cirrhosis      Long-Range Goal: Patient-Specific Goal   Start Date: 02/21/2021  Expected End Date: 02/21/2022  Recent Progress: On track  Priority: High  Note:   Current Barriers:  Unable to independently monitor therapeutic efficacy  Pharmacist Clinical Goal(s):  Patient will achieve adherence to monitoring guidelines and medication adherence to achieve therapeutic efficacy through collaboration with PharmD and provider.   Interventions: 1:1 collaboration with [name], NP regarding development and update of comprehensive plan of care as evidenced by provider attestation and co-signature Inter-disciplinary care team collaboration (see longitudinal plan of care) Comprehensive medication review performed; medication list updated in electronic medical record  Hypertension (BP goal <140/90) -Not ideally controlled -Current treatment: Amlodipine 353m, 1 tablet daily - in PM - Appropriate, Effective, Safe, Accessible Losartan-HCTZ 100-271m 1 tablet daily - in AM - Appropriate, Effective, Safe, Accessible -Medications previously tried: none  -Current home readings: 144/76, 125/64, 131/69, 123/69, 112/63  (checking in mornings) - did check it with the office  -Current dietary habits:  patient doesn't eat out often and does not eat frozen foods; his breakfast consists of a jimmy dean sausage biscuit -Current exercise habits: 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 Consider combination BP medication for losartan-HCTZ.  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  History of PE (Goal: prevent blood clots) -Controlled -Current  treatment  Eliquis 53m353m1 tablet twice daily - Appropriate, Effective, Safe, Accessible -Medications previously tried: warfarin -Recommended to continue current medication  GERD (Goal: minimize symptoms of heartburn/acid reflux) -Controlled -Current treatment  Omeprazole 20 mg, 1 capsule daily before bedtime - Appropriate, Effective, Safe, Accessible -Medications previously tried: none  -Counseled on non-pharmacologic management of symptoms such as elevating the head of your bed, avoiding eating  2-3 hours before bed, avoiding triggering foods such as acidic, spicy, or fatty foods, eating smaller meals, and wearing clothes that are loose around the waist  Osteoarthritis (Goal: minimize pain) -Controlled -Current treatment  Tylenol 523m, 2 tablets twice daily as needed for pain - Appropriate, Effective, Safe, Accessible CBD oil as needed -Medications previously tried: none  -Recommended to continue current medication Counseled on maximum daily amount of Tylenol of 20061mas patient has cirrhosis.  Health Maintenance -Vaccine gaps: none -Current therapy:  Vitamin C 1000 mg 1 tablet once daily  Vitamin D 5000 units 1 tablet daily -Educated on Herbal supplement research is limited and benefits usually cannot be proven Cost vs benefit of each product must be carefully weighed by individual consumer Supplements may interfere with prescription drugs -Patient is satisfied with current therapy and denies issues -Recommended to continue current medication  Patient Goals/Self-Care Activities Patient will:  - take medications as prescribed check blood pressure weekly, document, and provide at future appointments target a minimum of 150 minutes of moderate intensity exercise weekly  Follow Up Plan: Telephone follow up appointment with care management team member scheduled for: 6 months      Medication Assistance: None required.  Patient affirms current coverage meets  needs.  Compliance/Adherence/Medication fill history: Care Gaps: Eye exam, COVID booster Last BP - 130/60 on 10/18/2021 Last A1C - 6.6 on 10/18/2021   Star-Rating Drugs: Losartan 100 mg - last filled  [facility] JuAlmyra FreePatient's preferred pharmacy is:  [place]one: [phone]: [phone] IsabelOAr[facility]- [address]RoffOH[place]one: [phone]: [phone] Uses pill box? Yes - 3 pill boxes (1 in AM, 1 in afternoon, 1 in PM) Pt endorses 99% compliance  We discussed: Current pharmacy is preferred with insurance plan and patient is satisfied with pharmacy services Patient decided to: Continue current medication management strategy  Care Plan and Follow Up Patient Decision:  Patient agrees to Care Plan and Follow-up.  Plan: Telephone follow up appointment with care management team member scheduled for:  6 months  MaJeni SallesPharmD BCLive Oakharmacist [place] Vistat BrClimax [phone]

## 2021-11-22 ENCOUNTER — Ambulatory Visit (INDEPENDENT_AMBULATORY_CARE_PROVIDER_SITE_OTHER): Payer: Medicare HMO | Admitting: Adult Health

## 2021-11-22 ENCOUNTER — Encounter: Payer: Self-pay | Admitting: Adult Health

## 2021-11-22 VITALS — BP 140/70 | HR 71 | Temp 98.1°F | Ht 65.5 in | Wt 187.0 lb

## 2021-11-22 DIAGNOSIS — G8929 Other chronic pain: Secondary | ICD-10-CM

## 2021-11-22 DIAGNOSIS — M25512 Pain in left shoulder: Secondary | ICD-10-CM | POA: Diagnosis not present

## 2021-11-22 DIAGNOSIS — M25511 Pain in right shoulder: Secondary | ICD-10-CM

## 2021-11-22 MED ORDER — METHYLPREDNISOLONE ACETATE 80 MG/ML IJ SUSP
80.0000 mg | Freq: Once | INTRAMUSCULAR | Status: AC
  Administered 2021-11-22: 80 mg via INTRA_ARTICULAR

## 2021-11-22 NOTE — Progress Notes (Signed)
Subjective:    Patient ID: Dakota Lewis, male    DOB: 08-29-1933, 88 y.o.   MRN: [id number]  HPI 88 year old male who  has a past medical history of Bilateral pulmonary embolism ([place]), Chronic cough, DM (diabetes mellitus) ([place]), DVT (deep venous thrombosis) ([place]), Gastrointestinal bleed, H/O cardiovascular stress test (05/07/2011), H/O Doppler ultrasound (2013), H/O Doppler ultrasound (2012), H/O echocardiogram (03/14/2011), H/O exercise stress test (2006), Hiatal hernia, Hypertension, and Prostate cancer ([place]).  He presents to the office today for chronic shoulder pain bilaterally - wore in the left shoulder. He had a steroid injection into this left sholder back in 05/2020 and reports noticeable improved in his pain and ROM after the shot.   His right shoulder also has chronic pain but no loss of ROM.   He would like to have bilateral shoulder injections today   Review of Systems See HPI   Past Medical History:  Diagnosis Date   Bilateral pulmonary embolism ([facility])    lovenox & coumadin thearpy   Chronic cough    DM (diabetes mellitus) ([place])    DVT (deep venous thrombosis) ([facility])    left lower   Gastrointestinal bleed    prior   H/O cardiovascular stress test 05/07/2011   normal study, low risk scan   H/O Doppler ultrasound 2013   venous duplex doppler   H/O Doppler ultrasound 2012   venous duplex doppler   H/O echocardiogram 03/14/2011   EF 65-70%   H/O exercise stress test 2006   neg bruce protocol excercise stress test   Hiatal hernia    Hypertension    Prostate cancer [facility])    s/p radiation    Social History   Socioeconomic History   Marital status: Married    Spouse name: Not on file   Number of children: 3   Years of education: Not on file   Highest education level: Not on file  Occupational History   Not on file  Tobacco Use   Smoking status: Former    Packs/day: 0.75    Years: 20.00    Pack years: 15.00    Types: Cigarettes   Smokeless tobacco: Never   Vaping Use   Vaping Use: Never used  Substance and Sexual Activity   Alcohol use: No   Drug use: No   Sexual activity: Not on file  Other Topics Concern   Not on file  Social History Narrative   Not on file   Social Determinants of Health   Financial Resource Strain: Not on file  Food Insecurity: Not on file  Transportation Needs: Not on file  Physical Activity: Not on file  Stress: Not on file  Social Connections: Not on file  Intimate Partner Violence: Not on file    Past Surgical History:  Procedure Laterality Date   event monitor     2012   HERNIA REPAIR Bilateral    1991, [place]   IVC FILTER INSERTION     PROSTATE SURGERY      Family History  Problem Relation Age of Onset   Stroke Mother    Kidney disease Father    Heart disease Brother    Arthritis Brother    Heart disease Brother    Prostate cancer Brother     Allergies  Allergen Reactions   Lidocaine     ANTI ITCH CREAM, NO SPECIFICATIONS IN RECORDS.   Lisinopril     cough  Current Outpatient Medications on File Prior to Visit  Medication Sig Dispense Refill   acetaminophen (TYLENOL) 500 MG tablet Take 1,000 mg by mouth 2 (two) times daily as needed (pain).     amLODipine (NORVASC) 10 MG tablet Take 10 mg by mouth daily.     apixaban (ELIQUIS) 5 MG TABS tablet Take 5 mg by mouth 2 (two) times daily.     Ascorbic Acid (VITAMIN C) 1000 MG tablet Take 1,000 mg by mouth daily.     blood glucose meter kit and supplies KIT Dispense based on patient and insurance preference. Use up to four times daily as directed. 1 each 0   Blood Glucose Monitoring Suppl (ONE TOUCH ULTRA 2) w/Device KIT USE TO TEST BLOOD GLUCOSE TWICE DAILY 1 each 0   glucose blood test strip Use as instructed 300 each 3   Lancets 28G MISC 1 Stick by Does not apply route daily. 100 each 3   losartan-hydrochlorothiazide (HYZAAR) 100-25 MG tablet Take 1 tablet by mouth daily. 90 tablet 3   omeprazole  (PRILOSEC) 20 MG capsule Take 20 mg by mouth every other day.     polyethylene glycol powder (GLYCOLAX/MIRALAX) 17 GM/SCOOP powder TAKE 17 GRAMS BY MOUTH DAILY     potassium chloride (MICRO-K) 10 MEQ CR capsule Take 1 capsule (10 mEq total) by mouth daily. 90 capsule 3   No current facility-administered medications on file prior to visit.    BP 140/70    Pulse 71    Temp 98.1 F (36.7 C) (Oral)    Ht 5' 5.5" (1.664 m)    Wt 187 lb (84.8 kg)    SpO2 93%    BMI 30.65 kg/m       Objective:   Physical Exam Vitals and nursing note reviewed.  Constitutional:      Appearance: Normal appearance.  Musculoskeletal:     Right shoulder: Crepitus present. No swelling, deformity, tenderness or bony tenderness. Normal range of motion. Decreased strength.     Left shoulder: Bony tenderness and crepitus present. No swelling, deformity or tenderness. Decreased range of motion. Decreased strength.  Neurological:     Mental Status: He is alert.      Assessment & Plan:  1. Chronic right shoulder pain Shoulder injection - Likely due to osteoarthritis  Verbal consent obtained and verified. Sterile betadine prep. Furthur cleansed with alcohol. Topical analgesic spray: Ethyl chloride. Joint: right  subacromial injection Approached in typical fashion with: posterior approach Completed without difficulty Meds: 3 cc lidocaine 2% no epi, 1 cc depomedrol 48m/cc Needle:1.5 inch 25 gauge Aftercare instructions and Red flags advised. Immediate improvement in pain noted  - methylPREDNISolone acetate (DEPO-MEDROL) injection 80 mg  2. Chronic left shoulder pain - Likely due to osteoarthritis  Shoulder injection Verbal consent obtained and verified. Sterile betadine prep. Furthur cleansed with alcohol. Topical analgesic spray: Ethyl chloride. Joint: left subacromial injection Approached in typical fashion with: posterior approach Completed without difficulty Meds: 3 cc lidocaine 2% no epi, 1 cc  depomedrol 851mcc Needle:1.5 inch 25 gauge Aftercare instructions and Red flags advised. Immediate improvement in pain noted  - methylPREDNISolone acetate (DEPO-MEDROL) injection 80 mg  CoDorothyann PengNP

## 2021-11-29 ENCOUNTER — Telehealth: Payer: Self-pay | Admitting: Adult Health

## 2021-11-29 NOTE — Telephone Encounter (Signed)
Left message for patient to call back and schedule Medicare Annual Wellness Visit (AWV) either virtually or in office. Left  my Herbie Drape number [phone]   AWV-I per [place] 10/07/09 please schedule at anytime with [facility] Nurse Health Advisor 1 or 2   This should be a 45 minute visit.

## 2021-12-04 ENCOUNTER — Ambulatory Visit (INDEPENDENT_AMBULATORY_CARE_PROVIDER_SITE_OTHER): Payer: Medicare HMO

## 2021-12-04 VITALS — Ht 67.5 in | Wt 180.0 lb

## 2021-12-04 DIAGNOSIS — I10 Essential (primary) hypertension: Secondary | ICD-10-CM

## 2021-12-04 DIAGNOSIS — E1159 Type 2 diabetes mellitus with other circulatory complications: Secondary | ICD-10-CM | POA: Diagnosis not present

## 2021-12-04 DIAGNOSIS — Z0000 Encounter for general adult medical examination without abnormal findings: Secondary | ICD-10-CM | POA: Diagnosis not present

## 2021-12-04 DIAGNOSIS — M1990 Unspecified osteoarthritis, unspecified site: Secondary | ICD-10-CM

## 2021-12-04 NOTE — Patient Instructions (Signed)
[name] , Thank you for taking time to come for your Medicare Wellness Visit. I appreciate your ongoing commitment to your health goals. Please review the following plan we discussed and let me know if I can assist you in the future.   Screening recommendations/referrals: Colonoscopy: not required Recommended yearly ophthalmology/optometry visit for glaucoma screening and checkup Recommended yearly dental visit for hygiene and checkup  Vaccinations: Influenza vaccine: completed 07/12/2021, due next flu season Pneumococcal vaccine: completed 12/19/2016 Tdap vaccine: completed 08/02/2014, due 08/02/2024 Shingles vaccine: completed   Covid-19:  09/08/2020, 11/14/2019, 10/27/2019  Advanced directives: Please bring a copy of your POA (Power of Attorney) and/or Living Will to your next appointment.   Conditions/risks identified: none  Next appointment: Follow up in one year for your annual wellness visit.   Preventive Care 86 Years and Older, Male Preventive care refers to lifestyle choices and visits with your health care provider that can promote health and wellness. What does preventive care include? A yearly physical exam. This is also called an annual well check. Dental exams once or twice a year. Routine eye exams. Ask your health care provider how often you should have your eyes checked. Personal lifestyle choices, including: Daily care of your teeth and gums. Regular physical activity. Eating a healthy diet. Avoiding tobacco and drug use. Limiting alcohol use. Practicing safe sex. Taking low doses of aspirin every day. Taking vitamin and mineral supplements as recommended by your health care provider. What happens during an annual well check? The services and screenings done by your health care provider during your annual well check will depend on your age, overall health, lifestyle risk factors, and family history of disease. Counseling  Your health care provider may ask you  questions about your: Alcohol use. Tobacco use. Drug use. Emotional well-being. Home and relationship well-being. Sexual activity. Eating habits. History of falls. Memory and ability to understand (cognition). Work and work Statistician. Screening  You may have the following tests or measurements: Height, weight, and BMI. Blood pressure. Lipid and cholesterol levels. These may be checked every 5 years, or more frequently if you are over 33 years old. Skin check. Lung cancer screening. You may have this screening every year starting at age 64 if you have a 30-pack-year history of smoking and currently smoke or have quit within the past 15 years. Fecal occult blood test (FOBT) of the stool. You may have this test every year starting at age 72. Flexible sigmoidoscopy or colonoscopy. You may have a sigmoidoscopy every 5 years or a colonoscopy every 10 years starting at age 39. Prostate cancer screening. Recommendations will vary depending on your family history and other risks. Hepatitis C blood test. Hepatitis B blood test. Sexually transmitted disease (STD) testing. Diabetes screening. This is done by checking your blood sugar (glucose) after you have not eaten for a while (fasting). You may have this done every 1-3 years. Abdominal aortic aneurysm (AAA) screening. You may need this if you are a current or former smoker. Osteoporosis. You may be screened starting at age 46 if you are at high risk. Talk with your health care provider about your test results, treatment options, and if necessary, the need for more tests. Vaccines  Your health care provider may recommend certain vaccines, such as: Influenza vaccine. This is recommended every year. Tetanus, diphtheria, and acellular pertussis (Tdap, Td) vaccine. You may need a Td booster every 10 years. Zoster vaccine. You may need this after age 66. Pneumococcal 13-valent conjugate (PCV13) vaccine.  One dose is recommended after age  39. Pneumococcal polysaccharide (PPSV23) vaccine. One dose is recommended after age 67. Talk to your health care provider about which screenings and vaccines you need and how often you need them. This information is not intended to replace advice given to you by your health care provider. Make sure you discuss any questions you have with your health care provider. Document Released: 10/20/2015 Document Revised: 06/12/2016 Document Reviewed: 07/25/2015 Elsevier Interactive Patient Education  2017 [place] Prevention in the Home Falls can cause injuries. They can happen to people of all ages. There are many things you can do to make your home safe and to help prevent falls. What can I do on the outside of my home? Regularly fix the edges of walkways and driveways and fix any cracks. Remove anything that might make you trip as you walk through a door, such as a raised step or threshold. Trim any bushes or trees on the path to your home. Use bright outdoor lighting. Clear any walking paths of anything that might make someone trip, such as rocks or tools. Regularly check to see if handrails are loose or broken. Make sure that both sides of any steps have handrails. Any raised decks and porches should have guardrails on the edges. Have any leaves, snow, or ice cleared regularly. Use sand or salt on walking paths during winter. Clean up any spills in your garage right away. This includes oil or grease spills. What can I do in the bathroom? Use night lights. Install grab bars by the toilet and in the tub and shower. Do not use towel bars as grab bars. Use non-skid mats or decals in the tub or shower. If you need to sit down in the shower, use a plastic, non-slip stool. Keep the floor dry. Clean up any water that spills on the floor as soon as it happens. Remove soap buildup in the tub or shower regularly. Attach bath mats securely with double-sided non-slip rug tape. Do not have throw  rugs and other things on the floor that can make you trip. What can I do in the bedroom? Use night lights. Make sure that you have a light by your bed that is easy to reach. Do not use any sheets or blankets that are too big for your bed. They should not hang down onto the floor. Have a firm chair that has side arms. You can use this for support while you get dressed. Do not have throw rugs and other things on the floor that can make you trip. What can I do in the kitchen? Clean up any spills right away. Avoid walking on wet floors. Keep items that you use a lot in easy-to-reach places. If you need to reach something above you, use a strong step stool that has a grab bar. Keep electrical cords out of the way. Do not use floor polish or wax that makes floors slippery. If you must use wax, use non-skid floor wax. Do not have throw rugs and other things on the floor that can make you trip. What can I do with my stairs? Do not leave any items on the stairs. Make sure that there are handrails on both sides of the stairs and use them. Fix handrails that are broken or loose. Make sure that handrails are as long as the stairways. Check any carpeting to make sure that it is firmly attached to the stairs. Fix any carpet that is loose or  worn. Avoid having throw rugs at the top or bottom of the stairs. If you do have throw rugs, attach them to the floor with carpet tape. Make sure that you have a light switch at the top of the stairs and the bottom of the stairs. If you do not have them, ask someone to add them for you. What else can I do to help prevent falls? Wear shoes that: Do not have high heels. Have rubber bottoms. Are comfortable and fit you well. Are closed at the toe. Do not wear sandals. If you use a stepladder: Make sure that it is fully opened. Do not climb a closed stepladder. Make sure that both sides of the stepladder are locked into place. Ask someone to hold it for you, if  possible. Clearly mark and make sure that you can see: Any grab bars or handrails. First and last steps. Where the edge of each step is. Use tools that help you move around (mobility aids) if they are needed. These include: Canes. Walkers. Scooters. Crutches. Turn on the lights when you go into a dark area. Replace any light bulbs as soon as they burn out. Set up your furniture so you have a clear path. Avoid moving your furniture around. If any of your floors are uneven, fix them. If there are any pets around you, be aware of where they are. Review your medicines with your doctor. Some medicines can make you feel dizzy. This can increase your chance of falling. Ask your doctor what other things that you can do to help prevent falls. This information is not intended to replace advice given to you by your health care provider. Make sure you discuss any questions you have with your health care provider. Document Released: 07/20/2009 Document Revised: 02/29/2016 Document Reviewed: 10/28/2014 Elsevier Interactive Patient Education  2017 [employer].

## 2021-12-04 NOTE — Progress Notes (Signed)
I connected with Dakota Lewis today by telephone and verified that I am speaking with the correct person using two identifiers. Location patient: home Location provider: work Persons participating in the virtual visit: Johan Creveling, Glenna Durand LPN.   I discussed the limitations, risks, security and privacy concerns of performing an evaluation and management service by telephone and the availability of in person appointments. I also discussed with the patient that there may be a patient responsible charge related to this service. The patient expressed understanding and verbally consented to this telephonic visit.    Interactive audio and video telecommunications were attempted between this provider and patient, however failed, due to patient having technical difficulties OR patient did not have access to video capability.  We continued and completed visit with audio only.     Vital signs may be patient reported or missing.  Subjective:   Dakota Lewis is a 88 y.o. male who presents for an Initial Medicare Annual Wellness Visit.  Review of Systems     Cardiac Risk Factors include: advanced age (>54mn, >>41women);diabetes mellitus;hypertension;male gender     Objective:    Today's Vitals   12/04/21 0826  Weight: 180 lb (81.6 kg)  Height: 5' 7.5" (1.715 m)   Body mass index is 27.78 kg/m.  Advanced Directives 12/04/2021  Does Patient Have a Medical Advance Directive? Yes  [place]  Copy of [place] Chart? No - copy requested    Current Medications (verified) Outpatient Encounter Medications as of 12/04/2021  Medication Sig   acetaminophen (TYLENOL) 500 MG tablet Take 1,000 mg by mouth 2 (two) times daily as needed (pain).   amLODipine (NORVASC) 10 MG tablet Take 10 mg by mouth daily.   apixaban (ELIQUIS) 5 MG TABS tablet Take 5 mg by mouth 2 (two) times daily.   Ascorbic Acid (VITAMIN C) 1000 MG  tablet Take 1,000 mg by mouth daily.   blood glucose meter kit and supplies KIT Dispense based on patient and insurance preference. Use up to four times daily as directed.   Blood Glucose Monitoring Suppl (ONE TOUCH ULTRA 2) w/Device KIT USE TO TEST BLOOD GLUCOSE TWICE DAILY   glucose blood test strip Use as instructed   Lancets 28G MISC 1 Stick by Does not apply route daily.   losartan-hydrochlorothiazide (HYZAAR) 100-25 MG tablet Take 1 tablet by mouth daily.   omeprazole (PRILOSEC) 20 MG capsule Take 20 mg by mouth daily.   polyethylene glycol powder (GLYCOLAX/MIRALAX) 17 GM/SCOOP powder TAKE 17 GRAMS BY MOUTH DAILY   potassium chloride (MICRO-K) 10 MEQ CR capsule Take 1 capsule (10 mEq total) by mouth daily.   No facility-administered encounter medications on file as of 12/04/2021.    Allergies (verified) Lidocaine and Lisinopril   History: Past Medical History:  Diagnosis Date   Bilateral pulmonary embolism (HCC)    lovenox & coumadin thearpy   Chronic cough    [place]    DVT (deep venous thrombosis) (HCC)    left lower   Gastrointestinal bleed    prior   H/O cardiovascular stress test 05/07/2011   normal study, low risk scan   H/O Doppler ultrasound 2013   venous duplex doppler   H/O Doppler ultrasound 2012   venous duplex doppler   H/O echocardiogram 03/14/2011   EF 65-70%   H/O exercise stress test 2006   neg bruce protocol excercise stress test   Hiatal hernia  Hypertension    Prostate cancer [facility])    s/p radiation   Past Surgical History:  Procedure Laterality Date   event monitor     2012   HERNIA REPAIR Bilateral    1991, 1992   HIATAL HERNIA REPAIR     1997   IVC FILTER INSERTION     PROSTATE SURGERY     Family History  Problem Relation Age of Onset   Stroke Mother    Kidney disease Father    Heart disease Brother    Arthritis Brother    Heart disease Brother    Prostate cancer Brother    Social History   Socioeconomic  History   Marital status: Married    Spouse name: Not on file   Number of children: 3   Years of education: Not on file   Highest education level: Not on file  Occupational History   Not on file  Tobacco Use   Smoking status: Former    Packs/day: 0.75    Years: 20.00    Pack years: 15.00    Types: Cigarettes    Passive exposure: Past   Smokeless tobacco: Never  Vaping Use   Vaping Use: Never used  Substance and Sexual Activity   Alcohol use: No   Drug use: No   Sexual activity: Not on file  Other Topics Concern   Not on file  Social History Narrative   Not on file   Social Determinants of Health   Financial Resource Strain: Low Risk    Difficulty of Paying Living Expenses: Not hard at all  Food Insecurity: No Food Insecurity   Worried About Charity fundraiser in the Last Year: Never true   [place] in the Last Year: Never true  Transportation Needs: No Transportation Needs   Lack of Transportation (Medical): No   Lack of Transportation (Non-Medical): No  Physical Activity: Inactive   Days of Exercise per Week: 0 days   Minutes of Exercise per Session: 0 min  Stress: No Stress Concern Present   Feeling of Stress : Not at all  Social Connections: Not on file    Tobacco Counseling Counseling given: Not Answered   Clinical Intake:  Pre-visit preparation completed: Yes        Nutritional Status: BMI 25 -29 Overweight Nutritional Risks: None Diabetes: Yes  How often do you need to have someone help you when you read instructions, pamphlets, or other written materials from your doctor or pharmacy?: 1 - Never What is the last grade level you completed in school?: 12th grade  Diabetic? Yes Nutrition Risk Assessment:  Has the patient had any N/V/D within the last 2 months?  No  Does the patient have any non-healing wounds?  No  Has the patient had any unintentional weight loss or weight gain?  No   Diabetes:  Is the patient diabetic?  Yes  If  diabetic, was a CBG obtained today?  No  Did the patient bring in their glucometer from home?  No  How often do you monitor your CBG's? daily.   Financial Strains and Diabetes Management:  Are you having any financial strains with the device, your supplies or your medication? No .  Does the patient want to be seen by Chronic Care Management for management of their diabetes?  No  Would the patient like to be referred to a Nutritionist or for Diabetic Management?  No   Diabetic Exams:  Diabetic Eye Exam: Overdue for  diabetic eye exam. Pt has been advised about the importance in completing this exam. Patient advised to call and schedule an eye exam. Diabetic Foot Exam: Completed 07/12/2021   Interpreter Needed?: No  Information entered by :: NAllen LPN   Activities of Daily Living In your present state of health, do you have any difficulty performing the following activities: 12/04/2021 07/12/2021  Hearing? Y N  Comment has hearing aides -  Vision? N N  Difficulty concentrating or making decisions? N N  Walking or climbing stairs? N Y  Dressing or bathing? N N  Doing errands, shopping? N N  Preparing Food and eating ? N -  Using the Toilet? N -  In the past six months, have you accidently leaked urine? N -  Do you have problems with loss of bowel control? N -  Managing your Medications? N -  Managing your Finances? N -  Housekeeping or managing your Housekeeping? N -  Some recent data might be hidden    Patient Care Team: [name], NP as PCP - General (Family Medicine) Franchot Gallo, MD as Consulting Physician (Urology) Viona Gilmore, [facility] as Pharmacist (Pharmacist)  Indicate any recent Medical Services you may have received from other than [facility] providers in the past year (date may be approximate).     Assessment:   This is a routine wellness examination for Dakota Lewis.  Hearing/Vision screen Vision Screening - Comments:: [facility]  Dietary issues  and exercise activities discussed:     Goals Addressed             This Visit's Progress    Patient Stated       12/04/2021, no goals       Depression Screen PHQ 2/9 Scores 12/04/2021 10/18/2021 07/12/2021 07/07/2020 07/07/2019 04/23/2018  PHQ - 2 Score 0 0 0 0 0 0  PHQ- 9 Score - 5 - - - -    Fall Risk Fall Risk  12/04/2021 10/18/2021 09/20/2020 07/07/2020 07/07/2019  Falls in the past year? 0 0 0 0 0  Number falls in past yr: - 0 - - -  Injury with Fall? - 0 - - -  Risk for fall due to : Medication side effect - - - -  Follow up Falls evaluation completed;Education provided;Falls prevention discussed - Falls evaluation completed - -    FALL RISK PREVENTION PERTAINING TO THE HOME:  Any stairs in or around the home? Yes  If so, are there any without handrails? No  Home free of loose throw rugs in walkways, pet beds, electrical cords, etc? Yes  Adequate lighting in your home to reduce risk of falls? Yes   ASSISTIVE DEVICES UTILIZED TO PREVENT FALLS:  Life alert? No  Use of a cane, walker or w/c? Yes  Grab bars in the bathroom? Yes  Shower chair or bench in shower? Yes  Elevated toilet seat or a handicapped toilet? Yes   TIMED UP AND GO:  Was the test performed? No .      Cognitive Function:     6CIT Screen 12/04/2021  What Year? 0 points  What month? 0 points  What time? 0 points  Count back from 20 0 points  Months in reverse 0 points  Repeat phrase 6 points  Total Score 6    Immunizations Immunization History  Administered Date(s) Administered   Fluad Quad(high Dose 65+) 07/07/2019, 07/07/2020, 07/12/2021   Influenza Whole 07/18/2017   Influenza, High Dose Seasonal PF 07/06/2013, 07/22/2014, 06/07/2016,  05/07/2017, 07/21/2017   Influenza,inj,Quad PF,6+ Mos 07/18/2017, 07/22/2018   Influenza-Unspecified 07/13/2001, 07/07/2002, 07/08/2003, 08/02/2004, 07/07/2005, 07/26/2005, 08/07/2006, 07/23/2007, 06/07/2008, 07/07/2009, 07/08/2011, 07/07/2012   Moderna  SARS-COV2 Booster Vaccination 09/08/2020   Moderna Sars-Covid-2 Vaccination 10/27/2019, 11/24/2019, 09/08/2020   Pneumococcal Conjugate-13 10/21/2013   Pneumococcal Polysaccharide-23 12/19/2016   Pneumococcal-Unspecified 05/22/2002, 10/21/2013   Td 08/02/2014   Tdap 09/19/2012   Zoster Recombinat (Shingrix) 07/17/2019, 12/11/2019, 07/16/2020   Zoster, Live 11/02/2012    TDAP status: Up to date  Flu Vaccine status: Up to date  Pneumococcal vaccine status: Up to date  Covid-19 vaccine status: Completed vaccines  Qualifies for Shingles Vaccine? Yes   Zostavax completed Yes   Shingrix Completed?: Yes  Screening Tests Health Maintenance  Topic Date Due   OPHTHALMOLOGY EXAM  08/16/2020   COVID-19 Vaccine (3 - Moderna risk series) 12/20/2021 (Originally 10/06/2020)   HEMOGLOBIN A1C  04/17/2022   FOOT EXAM  07/12/2022   TETANUS/TDAP  08/02/2024   Pneumonia Vaccine 52+ Years old  Completed   INFLUENZA VACCINE  Completed   Zoster Vaccines- Shingrix  Completed   HPV VACCINES  Aged Out    Health Maintenance  Health Maintenance Due  Topic Date Due   OPHTHALMOLOGY EXAM  08/16/2020    Colorectal cancer screening: No longer required.   Lung Cancer Screening: (Low Dose CT Chest recommended if Age 86-80 years, 30 pack-year currently smoking OR have quit w/in 15years.) does not qualify.   Lung Cancer Screening Referral: no  Additional Screening:  Hepatitis C Screening: does not qualify;   Vision Screening: Recommended annual ophthalmology exams for early detection of glaucoma and other disorders of the eye. Is the patient up to date with their annual eye exam?  Yes  Who is the provider or what is the name of the office in which the patient attends [facility] exams? [facility] If pt is not established with a provider, would they like to be referred to a provider to establish care? No .   Dental Screening: Recommended annual dental exams for proper oral hygiene  Community Resource  Referral / Chronic Care Management: CRR required this visit?  No   CCM required this visit?  No      Plan:     I have personally reviewed and noted the following in the patients chart:   Medical and social history Use of alcohol, tobacco or illicit drugs  Current medications and supplements including opioid prescriptions. Patient is not currently taking opioid prescriptions. Functional ability and status Nutritional status Physical activity Advanced directives List of other physicians Hospitalizations, surgeries, and ER visits in previous 12 months Vitals Screenings to include cognitive, depression, and falls Referrals and appointments  In addition, I have reviewed and discussed with patient certain preventive protocols, quality metrics, and best practice recommendations. A written personalized care plan for preventive services as well as general preventive health recommendations were provided to patient.     Kellie Simmering, LPN   [id number]   Nurse Notes: none  Due to this being a virtual visit, the after visit summary with patients personalized plan was offered to patient via mail or my-chart. per request, patient was mailed a copy of AVS.

## 2022-02-07 ENCOUNTER — Telehealth: Payer: Self-pay | Admitting: Pharmacist

## 2022-02-07 NOTE — Chronic Care Management (AMB) (Signed)
Chronic Care Management Pharmacy Assistant   Name: Dakota Lewis  MRN: [id number] DOB: 09/09/1933  Reason for Encounter: Disease State / Hypertension and Diabetes Assessment Call   Conditions to be addressed/monitored: HTN   Recent office visits:  12/04/2021 Glenna Durand LPN - Medicare annual wellness exam  11/22/2021 [name] NP - Patient was seen for chronic right shoulder pain and an additional issue. No medication changes. No follow up noted.   Recent consult visits:  None  Hospital visits:  None  Medications: Outpatient Encounter Medications as of 02/07/2022  Medication Sig   acetaminophen (TYLENOL) 500 MG tablet Take 1,000 mg by mouth 2 (two) times daily as needed (pain).   amLODipine (NORVASC) 10 MG tablet Take 10 mg by mouth daily.   apixaban (ELIQUIS) 5 MG TABS tablet Take 5 mg by mouth 2 (two) times daily.   Ascorbic Acid (VITAMIN C) 1000 MG tablet Take 1,000 mg by mouth daily.   blood glucose meter kit and supplies KIT Dispense based on patient and insurance preference. Use up to four times daily as directed.   Blood Glucose Monitoring Suppl (ONE TOUCH ULTRA 2) w/Device KIT USE TO TEST BLOOD GLUCOSE TWICE DAILY   glucose blood test strip Use as instructed   Lancets 28G MISC 1 Stick by Does not apply route daily.   losartan-hydrochlorothiazide (HYZAAR) 100-25 MG tablet Take 1 tablet by mouth daily.   omeprazole (PRILOSEC) 20 MG capsule Take 20 mg by mouth daily.   polyethylene glycol powder (GLYCOLAX/MIRALAX) 17 GM/SCOOP powder TAKE 17 GRAMS BY MOUTH DAILY   potassium chloride (MICRO-K) 10 MEQ CR capsule Take 1 capsule (10 mEq total) by mouth daily.   No facility-administered encounter medications on file as of 02/07/2022.  Fill History: potassium chloride ER 10 mEq capsule,extended release 01/28/2022 90   amlodipine 10 mg tablet 01/28/2022 90  Reviewed chart prior to disease state call. Spoke with patient regarding BP  Recent Office Vitals: BP Readings  from Last 3 Encounters:  11/22/21 140/70  10/18/21 130/60  08/07/21 (!) 134/59   Pulse Readings from Last 3 Encounters:  11/22/21 71  10/18/21 69  07/12/21 (!) 48    Wt Readings from Last 3 Encounters:  12/04/21 180 lb (81.6 kg)  11/22/21 187 lb (84.8 kg)  10/18/21 183 lb (83 kg)     Kidney Function Lab Results  Component Value Date/Time   CREATININE 1.09 07/12/2021 09:44 AM   CREATININE 1.0 06/16/2020 12:00 AM   CREATININE 1.03 07/07/2019 09:20 AM   GFR 61.43 07/12/2021 09:44 AM   GFRNONAA >60 06/16/2020 12:00 AM   GFRAA  03/15/2011 04:00 AM    >60        The eGFR has been calculated using the MDRD equation. This calculation has not been validated in all clinical situations. eGFR's persistently <60 mL/min signify possible Chronic Kidney Disease.       Latest Ref Rng & Units 07/12/2021    9:44 AM 06/16/2020   12:00 AM 07/07/2019    9:20 AM  BMP  Glucose 70 - 99 mg/dL 125    123    BUN 6 - 23 mg/dL 20   32      23    Creatinine 0.40 - 1.50 mg/dL 1.09   1.0      1.03    Sodium 135 - 145 mEq/L 135   135      138    Potassium 3.5 - 5.1 mEq/L 4.3   4.8  4.7    Chloride 96 - 112 mEq/L 99   101      100    CO2 19 - 32 mEq/L _0 Calcium 8.4 - 10.5 mg/dL 10.0    10.2       This result is from an external source.    Recent Relevant Labs: Lab Results  Component Value Date/Time   HGBA1C 6.6 (A) 10/18/2021 02:05 PM   HGBA1C 7.2 (H) 07/12/2021 09:44 AM   HGBA1C 6.8 06/16/2020 12:00 AM   HGBA1C 6.3 07/07/2019 09:20 AM    Kidney Function Lab Results  Component Value Date/Time   CREATININE 1.09 07/12/2021 09:44 AM   CREATININE 1.0 06/16/2020 12:00 AM   CREATININE 1.03 07/07/2019 09:20 AM   GFR 61.43 07/12/2021 09:44 AM   GFRNONAA >60 06/16/2020 12:00 AM   GFRAA  03/15/2011 04:00 AM    >60        The eGFR has been calculated using the MDRD equation. This calculation has not been validated in all clinical situations. eGFR's persistently <60  mL/min signify possible Chronic Kidney Disease.   Current antihypertensive regimen:  Amlodipine 10 mg daily Losartan HCTZ 100/25 mg daily  How often are you checking your Blood Pressure? Patient states he is checking blood pressures 2-3 times weekly  Current home BP readings: Patient doesn't have logs to review but states they are always between 100/60 and 130/70.   What diet changes have been made to improve Blood Pressure Control?  Patient makes healthy choices Breakfast - patient will have chicken biscuit (only eats 1/2 of the biscuit) and coffee Lunch - patient will have a sandwich Dinner - patient will have a meat and vegetable  What exercise is being done to improve your Blood Pressure Control?  Patient is always working in his shop, doing housework and yard work   Current antihyperglycemic regimen:  Not currently taking medications (controlled)  What recent interventions/DTPs have been made to improve glycemic control and blood pressure: No recent interventions.   Have there been any recent hospitalizations or ED visits since last visit with CPP? No recent hospital visits  Patient denies hypoglycemic symptoms  Patient denies hyperglycemic symptoms  How often are you checking your blood sugar? Patient is checking blood sugars fasting 2-3 times per week.   What are your blood sugars ranging?  Fasting: Patient doesn't have any logs but states his blood sugars are running between 100-130 on occasion he will have a 140 reading.   During the week, how often does your blood glucose drop below 70? Patient states his blood sugars never go below 100  Are you checking your feet daily/regularly? Patient is checking his feet daily.   Adherence Review: Is the patient currently on a STATIN medication? No Is the patient currently on ACE/ARB medication? Yes Does the patient have >5 day gap between last estimated fill dates? No   Care Gaps: AWV - completed 12/04/2021 Last BP -  140/70 on 11/22/2021 Last A1C - 7.2 on 07/12/2021 Eye exam - overdue Covid booster - overdue   Star Rating Drug: Losartan HCTZ 100/25 mg - no fill history, filled at [facility] [phone]

## 2022-02-27 ENCOUNTER — Encounter: Payer: Self-pay | Admitting: Adult Health

## 2022-02-27 ENCOUNTER — Ambulatory Visit (INDEPENDENT_AMBULATORY_CARE_PROVIDER_SITE_OTHER): Payer: Medicare HMO | Admitting: Adult Health

## 2022-02-27 VITALS — BP 130/72 | HR 65 | Temp 97.9°F | Ht 67.5 in | Wt 189.4 lb

## 2022-02-27 DIAGNOSIS — M25511 Pain in right shoulder: Secondary | ICD-10-CM | POA: Diagnosis not present

## 2022-02-27 DIAGNOSIS — G8929 Other chronic pain: Secondary | ICD-10-CM

## 2022-02-27 DIAGNOSIS — S30861A Insect bite (nonvenomous) of abdominal wall, initial encounter: Secondary | ICD-10-CM | POA: Diagnosis not present

## 2022-02-27 DIAGNOSIS — W57XXXA Bitten or stung by nonvenomous insect and other nonvenomous arthropods, initial encounter: Secondary | ICD-10-CM

## 2022-02-27 DIAGNOSIS — M25512 Pain in left shoulder: Secondary | ICD-10-CM

## 2022-02-27 MED ORDER — FLUTICASONE PROPIONATE 50 MCG/ACT NA SUSP: 2.0000 | Freq: Every day | NASAL | 6 refills | Status: DC

## 2022-02-27 MED ORDER — CLOTRIMAZOLE-BETAMETHASONE 1-0.05 % EX CREA: 1.0000 "application " | TOPICAL_CREAM | Freq: Every day | CUTANEOUS | 2 refills | Status: DC

## 2022-02-27 MED ORDER — DOXYCYCLINE HYCLATE 100 MG PO TABS: 200.0000 mg | ORAL_TABLET | Freq: Once | ORAL | 0 refills | Status: AC

## 2022-02-27 MED ORDER — METHYLPREDNISOLONE ACETATE 80 MG/ML IJ SUSP
80.0000 mg | Freq: Once | INTRAMUSCULAR | Status: AC
  Administered 2022-02-27: 80 mg via INTRA_ARTICULAR

## 2022-02-27 NOTE — Progress Notes (Signed)
Subjective:    Patient ID: Dakota Lewis, male    DOB: 11-Mar-1933, 87 y.o.   MRN: [id number]  HPI  88 year old male who  has a past medical history of Bilateral pulmonary embolism ([place]), Chronic cough, DM (diabetes mellitus) ([place]), DVT (deep venous thrombosis) ([place]), Gastrointestinal bleed, H/O cardiovascular stress test (05/07/2011), H/O Doppler ultrasound (2013), H/O Doppler ultrasound (2012), H/O echocardiogram (03/14/2011), H/O exercise stress test (2006), Hiatal hernia, Hypertension, and Prostate cancer ([place]).  He presents to the office today for chronic bilateral shoulder pain, worse in his left shoulder.  He had a steroid injection into the left shoulder back in February 2023 and reported noticeable improvement in his pain and range of motion after the shot.  His right shoulder also has chronic pain but no loss of range of motion.  He had a steroid injection into the shoulder in February 2023 as well as noticed significant improvement in his pain after the shot.  Unfortunately over the last few weeks the pain has started to return.  He would like to have shoulder injections into both shoulders today to help alleviate his discomfort.  Does not want to have shoulder surgery  Additionally, he reports multiple tick bites over the last week. He is unsure how long the ticks were attached. He was able to remove the tick entirely. Has localized redness and itching but no other signs or symptoms   Review of Systems See HPI   Past Medical History:  Diagnosis Date   Bilateral pulmonary embolism ([facility])    lovenox & coumadin thearpy   Chronic cough    DM (diabetes mellitus) ([place])    DVT (deep venous thrombosis) ([facility])    left lower   Gastrointestinal bleed    prior   H/O cardiovascular stress test 05/07/2011   normal study, low risk scan   H/O Doppler ultrasound 2013   venous duplex doppler   H/O Doppler ultrasound 2012   venous duplex doppler   H/O echocardiogram 03/14/2011   EF 65-70%    H/O exercise stress test 2006   neg bruce protocol excercise stress test   Hiatal hernia    Hypertension    Prostate cancer [facility])    s/p radiation    Social History   Socioeconomic History   Marital status: Married    Spouse name: Not on file   Number of children: 3   Years of education: Not on file   Highest education level: Not on file  Occupational History   Not on file  Tobacco Use   Smoking status: Former    Packs/day: 0.75    Years: 20.00    Pack years: 15.00    Types: Cigarettes    Passive exposure: Past   Smokeless tobacco: Never  Vaping Use   Vaping Use: Never used  Substance and Sexual Activity   Alcohol use: No   Drug use: No   Sexual activity: Not on file  Other Topics Concern   Not on file  Social History Narrative   Not on file   Social Determinants of Health   Financial Resource Strain: Low Risk    Difficulty of Paying Living Expenses: Not hard at all  Food Insecurity: No Food Insecurity   Worried About Charity fundraiser in the Last Year: Never true   Ran Out of Food in the Last Year: Never true  Transportation Needs: No Transportation Needs   Lack of Transportation (Medical): No   Lack of Transportation (Non-Medical):  No  Physical Activity: Inactive   Days of Exercise per Week: 0 days   Minutes of Exercise per Session: 0 min  Stress: No Stress Concern Present   Feeling of Stress : Not at all  Social Connections: Not on file  Intimate Partner Violence: Not on file    Past Surgical History:  Procedure Laterality Date   event monitor     2012   HERNIA REPAIR Bilateral    1991, [place]   IVC FILTER INSERTION     PROSTATE SURGERY      Family History  Problem Relation Age of Onset   Stroke Mother    Kidney disease Father    Heart disease Brother    Arthritis Brother    Heart disease Brother    Prostate cancer Brother     Allergies  Allergen Reactions   Lidocaine     ANTI ITCH CREAM, NO  SPECIFICATIONS IN RECORDS.   Lisinopril     cough    Current Outpatient Medications on File Prior to Visit  Medication Sig Dispense Refill   acetaminophen (TYLENOL) 500 MG tablet Take 1,000 mg by mouth 2 (two) times daily as needed (pain).     amLODipine (NORVASC) 10 MG tablet Take 10 mg by mouth daily.     apixaban (ELIQUIS) 5 MG TABS tablet Take 5 mg by mouth 2 (two) times daily.     Ascorbic Acid (VITAMIN C) 1000 MG tablet Take 1,000 mg by mouth daily.     blood glucose meter kit and supplies KIT Dispense based on patient and insurance preference. Use up to four times daily as directed. 1 each 0   Blood Glucose Monitoring Suppl (ONE TOUCH ULTRA 2) w/Device KIT USE TO TEST BLOOD GLUCOSE TWICE DAILY 1 each 0   glucose blood test strip Use as instructed 300 each 3   Lancets 28G MISC 1 Stick by Does not apply route daily. 100 each 3   losartan-hydrochlorothiazide (HYZAAR) 100-25 MG tablet Take 1 tablet by mouth daily. 90 tablet 3   omeprazole (PRILOSEC) 20 MG capsule Take 20 mg by mouth daily.     polyethylene glycol powder (GLYCOLAX/MIRALAX) 17 GM/SCOOP powder TAKE 17 GRAMS BY MOUTH DAILY     potassium chloride (MICRO-K) 10 MEQ CR capsule Take 1 capsule (10 mEq total) by mouth daily. 90 capsule 3   No current facility-administered medications on file prior to visit.    BP 130/72 (BP Location: Left Arm, Patient Position: Sitting, Cuff Size: Large)   Pulse 65   Temp 97.9 F (36.6 C) (Oral)   Ht 5' 7.5" (1.715 m)   Wt 189 lb 6.1 oz (85.9 kg)   SpO2 96%   BMI 29.22 kg/m       Objective:   Physical Exam Vitals and nursing note reviewed.  Constitutional:      Appearance: Normal appearance.  Cardiovascular:     Rate and Rhythm: Normal rate and regular rhythm.     Pulses: Normal pulses.     Heart sounds: Normal heart sounds.  Pulmonary:     Effort: Pulmonary effort is normal.     Breath sounds: Normal breath sounds.  Musculoskeletal:     Right shoulder: Bony tenderness and  crepitus present. Normal range of motion. Normal strength.     Left shoulder: Bony tenderness and crepitus present. Decreased range of motion. Normal strength.  Skin:    General: Skin is warm and dry.  Findings: Rash present.     Comments: Insect bite on left lower abdomen and left upper back. Localized erythema. No bulls eye rash   Neurological:     Mental Status: He is alert and oriented to person, place, and time.  Psychiatric:        Mood and Affect: Mood normal.        Behavior: Behavior normal.        Thought Content: Thought content normal.      Assessment & Plan:  1. Chronic right shoulder pain Shoulder injection Verbal consent obtained and verified. Sterile betadine prep. Furthur cleansed with alcohol. Topical analgesic spray: Ethyl chloride. Joint: right  subacromial injection Approached in typical fashion with: posterior approach Completed without difficulty Meds: 3 cc lidocaine 2% no epi, 1 cc depomedrol 31m/cc Needle:1.5 inch 25 gauge Aftercare instructions and Red flags advised. Immediate improvement in pain noted  - methylPREDNISolone acetate (DEPO-MEDROL) injection 80 mg  2. Chronic left shoulder pain Shoulder injection Verbal consent obtained and verified. Sterile betadine prep. Furthur cleansed with alcohol. Topical analgesic spray: Ethyl chloride. Joint: left  subacromial injection Approached in typical fashion with: posterior approach Completed without difficulty Meds: 3 cc lidocaine 2% no epi, 1 cc depomedrol 834mcc Needle:1.5 inch 25 gauge Aftercare instructions and Red flags advised. Immediate improvement in pain noted  - methylPREDNISolone acetate (DEPO-MEDROL) injection 80 mg  3. Tick bite of abdomen, initial encounter - Will treat due to unknown duration of how long tick was attached  - doxycycline (VIBRA-TABS) 100 MG tablet; Take 2 tablets (200 mg total) by mouth once for 1 dose.  Dispense: 2 tablet; Refill: 0   CoDorothyann PengNP

## 2022-03-20 ENCOUNTER — Other Ambulatory Visit: Payer: Self-pay | Admitting: Nurse Practitioner

## 2022-03-20 DIAGNOSIS — K7460 Unspecified cirrhosis of liver: Secondary | ICD-10-CM

## 2022-03-20 DIAGNOSIS — K7581 Nonalcoholic steatohepatitis (NASH): Secondary | ICD-10-CM | POA: Diagnosis not present

## 2022-03-21 ENCOUNTER — Emergency Department (HOSPITAL_COMMUNITY): Payer: Medicare HMO | Admitting: Anesthesiology

## 2022-03-21 ENCOUNTER — Emergency Department (HOSPITAL_COMMUNITY): Payer: Medicare HMO

## 2022-03-21 ENCOUNTER — Emergency Department (EMERGENCY_DEPARTMENT_HOSPITAL): Payer: Medicare HMO | Admitting: Anesthesiology

## 2022-03-21 ENCOUNTER — Encounter (HOSPITAL_COMMUNITY)
Admission: EM | Disposition: A | Discharge status: Home / Self Care | Payer: Self-pay | Source: Home / Self Care | Attending: Emergency Medicine

## 2022-03-21 ENCOUNTER — Encounter (HOSPITAL_COMMUNITY): Payer: Self-pay | Admitting: Certified Registered Nurse Anesthetist

## 2022-03-21 ENCOUNTER — Other Ambulatory Visit: Payer: Self-pay

## 2022-03-21 ENCOUNTER — Ambulatory Visit (HOSPITAL_COMMUNITY)
Admission: EM | Admit: 2022-03-21 | Discharge: 2022-03-22 | Disposition: A | Discharge status: Home / Self Care | Payer: Medicare HMO | Attending: Emergency Medicine | Admitting: Emergency Medicine

## 2022-03-21 DIAGNOSIS — S62637B Displaced fracture of distal phalanx of left little finger, initial encounter for open fracture: Secondary | ICD-10-CM | POA: Insufficient documentation

## 2022-03-21 DIAGNOSIS — K449 Diaphragmatic hernia without obstruction or gangrene: Secondary | ICD-10-CM | POA: Insufficient documentation

## 2022-03-21 DIAGNOSIS — Z7722 Contact with and (suspected) exposure to environmental tobacco smoke (acute) (chronic): Secondary | ICD-10-CM | POA: Diagnosis not present

## 2022-03-21 DIAGNOSIS — E119 Type 2 diabetes mellitus without complications: Secondary | ICD-10-CM | POA: Insufficient documentation

## 2022-03-21 DIAGNOSIS — S62625A Displaced fracture of medial phalanx of left ring finger, initial encounter for closed fracture: Secondary | ICD-10-CM | POA: Diagnosis present

## 2022-03-21 DIAGNOSIS — Z23 Encounter for immunization: Secondary | ICD-10-CM | POA: Diagnosis not present

## 2022-03-21 DIAGNOSIS — W312XXA Contact with powered woodworking and forming machines, initial encounter: Secondary | ICD-10-CM | POA: Diagnosis not present

## 2022-03-21 DIAGNOSIS — S68625A Partial traumatic transphalangeal amputation of left ring finger, initial encounter: Secondary | ICD-10-CM | POA: Insufficient documentation

## 2022-03-21 DIAGNOSIS — Z86718 Personal history of other venous thrombosis and embolism: Secondary | ICD-10-CM | POA: Insufficient documentation

## 2022-03-21 DIAGNOSIS — S62631B Displaced fracture of distal phalanx of left index finger, initial encounter for open fracture: Secondary | ICD-10-CM | POA: Diagnosis not present

## 2022-03-21 DIAGNOSIS — Z87891 Personal history of nicotine dependence: Secondary | ICD-10-CM | POA: Insufficient documentation

## 2022-03-21 DIAGNOSIS — Z86711 Personal history of pulmonary embolism: Secondary | ICD-10-CM | POA: Diagnosis not present

## 2022-03-21 DIAGNOSIS — I10 Essential (primary) hypertension: Secondary | ICD-10-CM

## 2022-03-21 DIAGNOSIS — S6992XA Unspecified injury of left wrist, hand and finger(s), initial encounter: Secondary | ICD-10-CM | POA: Diagnosis not present

## 2022-03-21 DIAGNOSIS — S62637A Displaced fracture of distal phalanx of left little finger, initial encounter for closed fracture: Secondary | ICD-10-CM | POA: Diagnosis not present

## 2022-03-21 DIAGNOSIS — Z7901 Long term (current) use of anticoagulants: Secondary | ICD-10-CM | POA: Insufficient documentation

## 2022-03-21 DIAGNOSIS — R58 Hemorrhage, not elsewhere classified: Secondary | ICD-10-CM | POA: Diagnosis not present

## 2022-03-21 DIAGNOSIS — S61412A Laceration without foreign body of left hand, initial encounter: Secondary | ICD-10-CM

## 2022-03-21 HISTORY — PX: AMPUTATION: SHX166

## 2022-03-21 LAB — CBC WITH DIFFERENTIAL/PLATELET
Abs Immature Granulocytes: 0.04 10*3/uL (ref 0.00–0.07)
Basophils Absolute: 0 10*3/uL (ref 0.0–0.1)
Basophils Relative: 0 %
Eosinophils Absolute: 0.1 10*3/uL (ref 0.0–0.5)
Eosinophils Relative: 1 %
HCT: 42.5 % (ref 39.0–52.0)
Hemoglobin: 14 g/dL (ref 13.0–17.0)
Immature Granulocytes: 1 %
Lymphocytes Relative: 25 %
Lymphs Abs: 2 10*3/uL (ref 0.7–4.0)
MCH: 30.8 pg (ref 26.0–34.0)
MCHC: 32.9 g/dL (ref 30.0–36.0)
MCV: 93.6 fL (ref 80.0–100.0)
Monocytes Absolute: 0.7 10*3/uL (ref 0.1–1.0)
Monocytes Relative: 9 %
Neutro Abs: 5.2 10*3/uL (ref 1.7–7.7)
Neutrophils Relative %: 64 %
Platelets: 169 10*3/uL (ref 150–400)
RBC: 4.54 MIL/uL (ref 4.22–5.81)
RDW: 12.5 % (ref 11.5–15.5)
WBC: 8 10*3/uL (ref 4.0–10.5)
nRBC: 0 % (ref 0.0–0.2)

## 2022-03-21 LAB — BASIC METABOLIC PANEL
Anion gap: 10 (ref 5–15)
BUN: 21 mg/dL (ref 8–23)
CO2: 25 mmol/L (ref 22–32)
Calcium: 10.1 mg/dL (ref 8.9–10.3)
Chloride: 104 mmol/L (ref 98–111)
Creatinine, Ser: 1.02 mg/dL (ref 0.61–1.24)
GFR, Estimated: >60 mL/min (ref >60)
Glucose, Bld: 144 mg/dL — ABNORMAL HIGH (ref 70–99)
Potassium: 4.5 mmol/L (ref 3.5–5.1)
Sodium: 139 mmol/L (ref 135–145)

## 2022-03-21 LAB — GLUCOSE, CAPILLARY: Glucose-Capillary: 139 mg/dL — ABNORMAL HIGH (ref 70–99)

## 2022-03-21 LAB — PROTIME-INR
INR: 1.3 — ABNORMAL HIGH (ref 0.8–1.2)
Prothrombin Time: 16 seconds — ABNORMAL HIGH (ref 11.4–15.2)

## 2022-03-21 SURGERY — AMPUTATION DIGIT
Anesthesia: General | Site: Finger | Laterality: Left

## 2022-03-21 MED ORDER — DOXYCYCLINE HYCLATE 50 MG PO CAPS: 100.0000 mg | ORAL_CAPSULE | Freq: Two times a day (BID) | ORAL | 0 refills | Status: DC

## 2022-03-21 MED ORDER — SUGAMMADEX SODIUM 200 MG/2ML IV SOLN
INTRAVENOUS | Status: DC | PRN
  Administered 2022-03-21: 200 mg via INTRAVENOUS

## 2022-03-21 MED ORDER — SUCCINYLCHOLINE CHLORIDE 200 MG/10ML IV SOSY
PREFILLED_SYRINGE | INTRAVENOUS | Status: DC | PRN
  Administered 2022-03-21: 100 mg via INTRAVENOUS

## 2022-03-21 MED ORDER — 0.9 % SODIUM CHLORIDE (POUR BTL) OPTIME
TOPICAL | Status: DC | PRN
  Administered 2022-03-21: 1000 mL

## 2022-03-21 MED ORDER — BUPIVACAINE HCL (PF) 0.25 % IJ SOLN
INTRAMUSCULAR | Status: DC | PRN
  Administered 2022-03-21: 9 mL

## 2022-03-21 MED ORDER — BUPIVACAINE HCL (PF) 0.25 % IJ SOLN
INTRAMUSCULAR | Status: AC
  Filled 2022-03-21: qty 30

## 2022-03-21 MED ORDER — SODIUM CHLORIDE 0.9 % IV BOLUS
500.0000 mL | Freq: Once | INTRAVENOUS | Status: AC
  Administered 2022-03-21: 500 mL via INTRAVENOUS

## 2022-03-21 MED ORDER — CEFAZOLIN SODIUM-DEXTROSE 1-4 GM/50ML-% IV SOLN
1.0000 g | Freq: Once | INTRAVENOUS | Status: AC
  Administered 2022-03-21: 1 g via INTRAVENOUS
  Filled 2022-03-21: qty 50

## 2022-03-21 MED ORDER — ONDANSETRON HCL 4 MG/2ML IJ SOLN
INTRAMUSCULAR | Status: DC | PRN
  Administered 2022-03-21: 4 mg via INTRAVENOUS

## 2022-03-21 MED ORDER — FENTANYL CITRATE PF 50 MCG/ML IJ SOSY
50.0000 ug | PREFILLED_SYRINGE | INTRAMUSCULAR | Status: DC | PRN
  Administered 2022-03-21: 50 ug via INTRAVENOUS
  Filled 2022-03-21: qty 1

## 2022-03-21 MED ORDER — TETANUS-DIPHTH-ACELL PERTUSSIS 5-2.5-18.5 LF-MCG/0.5 IM SUSY
0.5000 mL | PREFILLED_SYRINGE | Freq: Once | INTRAMUSCULAR | Status: AC
  Administered 2022-03-21: 0.5 mL via INTRAMUSCULAR
  Filled 2022-03-21: qty 0.5

## 2022-03-21 MED ORDER — EPHEDRINE SULFATE (PRESSORS) 50 MG/ML IJ SOLN
INTRAMUSCULAR | Status: DC | PRN
  Administered 2022-03-21 (×3): 5 mg via INTRAVENOUS

## 2022-03-21 MED ORDER — FENTANYL CITRATE (PF) 100 MCG/2ML IJ SOLN
INTRAMUSCULAR | Status: DC | PRN
Start: 2022-03-21 — End: 2022-03-21
  Administered 2022-03-21: 100 ug via INTRAVENOUS
  Administered 2022-03-21: 50 ug via INTRAVENOUS

## 2022-03-21 MED ORDER — PROPOFOL 10 MG/ML IV BOLUS
INTRAVENOUS | Status: AC
  Filled 2022-03-21: qty 20

## 2022-03-21 MED ORDER — PROPOFOL 10 MG/ML IV BOLUS
INTRAVENOUS | Status: DC | PRN
  Administered 2022-03-21: 130 mg via INTRAVENOUS

## 2022-03-21 MED ORDER — FENTANYL CITRATE PF 50 MCG/ML IJ SOSY
50.0000 ug | PREFILLED_SYRINGE | Freq: Once | INTRAMUSCULAR | Status: AC
  Administered 2022-03-21: 50 ug via INTRAVENOUS
  Filled 2022-03-21: qty 1

## 2022-03-21 MED ORDER — ROCURONIUM BROMIDE 100 MG/10ML IV SOLN
INTRAVENOUS | Status: DC | PRN
  Administered 2022-03-21: 50 mg via INTRAVENOUS

## 2022-03-21 MED ORDER — LACTATED RINGERS IV SOLN: INTRAVENOUS | Status: DC | PRN

## 2022-03-21 MED ORDER — CEFAZOLIN SODIUM-DEXTROSE 2-3 GM-%(50ML) IV SOLR
INTRAVENOUS | Status: DC | PRN
  Administered 2022-03-21: 2 g via INTRAVENOUS

## 2022-03-21 MED ORDER — FENTANYL CITRATE (PF) 250 MCG/5ML IJ SOLN
INTRAMUSCULAR | Status: AC
  Filled 2022-03-21: qty 5

## 2022-03-21 MED ORDER — FENTANYL CITRATE (PF) 100 MCG/2ML IJ SOLN: 25.0000 ug | INTRAMUSCULAR | Status: DC | PRN

## 2022-03-21 MED ORDER — PHENYLEPHRINE HCL (PRESSORS) 10 MG/ML IV SOLN
INTRAVENOUS | Status: DC | PRN
  Administered 2022-03-21: 80 ug via INTRAVENOUS

## 2022-03-21 MED ORDER — HYDROCODONE-ACETAMINOPHEN 5-325 MG PO TABS
ORAL_TABLET | ORAL | 0 refills | Status: DC
Start: 2022-03-21 — End: 2022-05-08

## 2022-03-21 SURGICAL SUPPLY — 44 items
BNDG CONFORM 3 STRL LF (GAUZE/BANDAGES/DRESSINGS) ×2 IMPLANT
BNDG ELASTIC 3X5.8 VLCR STR LF (GAUZE/BANDAGES/DRESSINGS) ×2 IMPLANT
BNDG ESMARK 4X9 LF (GAUZE/BANDAGES/DRESSINGS) ×3 IMPLANT
BNDG GAUZE DERMACEA FLUFF (GAUZE/BANDAGES/DRESSINGS) ×1
BNDG GAUZE DERMACEA FLUFF 4 (GAUZE/BANDAGES/DRESSINGS) ×1 IMPLANT
CORD BIPOLAR FORCEPS 12FT (ELECTRODE) ×3 IMPLANT
COVER SURGICAL LIGHT HANDLE (MISCELLANEOUS) ×3 IMPLANT
CUFF TOURN SGL QUICK 18X4 (TOURNIQUET CUFF) ×3 IMPLANT
DRAIN PENROSE 1/4X12 LTX STRL (WOUND CARE) IMPLANT
GAUZE SPONGE 4X4 12PLY STRL (GAUZE/BANDAGES/DRESSINGS) ×2 IMPLANT
GAUZE XEROFORM 1X8 LF (GAUZE/BANDAGES/DRESSINGS) ×2 IMPLANT
GLOVE BIO SURGEON STRL SZ7.5 (GLOVE) ×3 IMPLANT
GLOVE BIOGEL PI IND STRL 7.0 (GLOVE) ×1 IMPLANT
GLOVE BIOGEL PI IND STRL 8 (GLOVE) ×2 IMPLANT
GLOVE BIOGEL PI INDICATOR 7.0 (GLOVE) ×1
GLOVE BIOGEL PI INDICATOR 8 (GLOVE) ×1
GLOVE SURG SS PI 7.0 STRL IVOR (GLOVE) ×2 IMPLANT
GOWN STRL REUS W/ TWL LRG LVL3 (GOWN DISPOSABLE) ×3 IMPLANT
GOWN STRL REUS W/ TWL XL LVL3 (GOWN DISPOSABLE) ×2 IMPLANT
GOWN STRL REUS W/TWL LRG LVL3 (GOWN DISPOSABLE) ×6
GOWN STRL REUS W/TWL XL LVL3 (GOWN DISPOSABLE) ×3
KIT BASIN OR (CUSTOM PROCEDURE TRAY) ×3 IMPLANT
KIT TURNOVER KIT B (KITS) ×3 IMPLANT
NDL HYPO 25GX1X1/2 BEV (NEEDLE) IMPLANT
NEEDLE HYPO 25GX1X1/2 BEV (NEEDLE) ×3 IMPLANT
NS IRRIG 1000ML POUR BTL (IV SOLUTION) ×3 IMPLANT
PACK ORTHO EXTREMITY (CUSTOM PROCEDURE TRAY) ×3 IMPLANT
PAD ARMBOARD 7.5X6 YLW CONV (MISCELLANEOUS) ×6 IMPLANT
PAD CAST 3X4 CTTN HI CHSV (CAST SUPPLIES) ×1 IMPLANT
PADDING CAST COTTON 3X4 STRL (CAST SUPPLIES) ×3
SLING ARM FOAM STRAP LRG (SOFTGOODS) ×2 IMPLANT
SOL PREP POV-IOD 4OZ 10% (MISCELLANEOUS) ×6 IMPLANT
SPONGE T-LAP 4X18 ~~LOC~~+RFID (SPONGE) ×3 IMPLANT
SUT CHROMIC 6 0 PS 4 (SUTURE) ×2 IMPLANT
SUT ETHILON 4 0 P 3 18 (SUTURE) IMPLANT
SUT ETHILON 4 0 PS 2 18 (SUTURE) IMPLANT
SUT MON AB 5-0 P3 18 (SUTURE) IMPLANT
SUT MON AB 5-0 PS2 18 (SUTURE) ×4 IMPLANT
SYR CONTROL 10ML LL (SYRINGE) ×2 IMPLANT
TOWEL GREEN STERILE (TOWEL DISPOSABLE) ×3 IMPLANT
TUBE CONNECTING 12X1/4 (SUCTIONS) ×3 IMPLANT
TUBE FEEDING ENTERAL 5FR 16IN (TUBING) IMPLANT
UNDERPAD 30X36 HEAVY ABSORB (UNDERPADS AND DIAPERS) ×3 IMPLANT
YANKAUER SUCT BULB TIP NO VENT (SUCTIONS) ×3 IMPLANT

## 2022-03-21 NOTE — Discharge Instructions (Addendum)

## 2022-03-21 NOTE — ED Triage Notes (Signed)
Pt BIB EMS from for partial finger(s) amputation.Pt was using a power saw and the saw went over his left pinky and ring finger. EMS reports not a clean break more of a "grounding." Pt is on eliquis, bleeding is controlled, pulses palpable, pt in NAD

## 2022-03-21 NOTE — H&P (Addendum)
Dakota Lewis is an 88 y.o. male.   Chief Complaint: saw injury HPI: 88 yo male states he sustained injury to left ring and small fingers from table saw earlier today.  Seen at [facility] where XR revealed distal phalanx fractures.  He reports no previous injury to fingers and no other injury at this time.  XR: 3 views left hand show ring and small finger distal phalanx fractures.  Bone loss in small finger distal phalanx. Allergies:  Allergies  Allergen Reactions   Lidocaine     ANTI ITCH CREAM, NO SPECIFICATIONS IN RECORDS.   Lisinopril     cough    Past Medical History:  Diagnosis Date   Bilateral pulmonary embolism (HCC)    lovenox & coumadin thearpy   Chronic cough    DM (diabetes mellitus) ([place])    DVT (deep venous thrombosis) (H[facility])    left lower   Gastrointestinal bleed    prior   H/O cardiovascular stress test 05/07/2011   normal study, low risk scan   H/O Doppler ultrasound 2013   venous duplex doppler   H/O Doppler ultrasound 2012   venous duplex doppler   H/O echocardiogram 03/14/2011   EF 65-70%   H/O exercise stress test 2006   neg bruce protocol excercise stress test   Hiatal hernia    Hypertension    Prostate cancer [facility])    s/p radiation    Past Surgical History:  Procedure Laterality Date   event monitor     2012   HERNIA REPAIR Bilateral    [place]   IVC FILTER INSERTION     PROSTATE SURGERY      Family History: Family History  Problem Relation Age of Onset   Stroke Mother    Kidney disease Father    Heart disease Brother    Arthritis Brother    Heart disease Brother    Prostate cancer Brother     Social History:   reports that he has quit smoking. His smoking use included cigarettes. He has a 15.00 pack-year smoking history. He has been exposed to tobacco smoke. He has never used smokeless tobacco. He reports that he does not drink alcohol and does not use drugs.  Medications: (Not in a hospital  admission)   Results for orders placed or performed during the hospital encounter of 03/21/22 (from the past 48 hour(s))  Basic metabolic panel     Status: Abnormal   Collection Time: 03/21/22  8:22 PM  Result Value Ref Range   Sodium 139 135 - 145 mmol/L   Potassium 4.5 3.5 - 5.1 mmol/L   Chloride 104 98 - 111 mmol/L   CO2 25 22 - 32 mmol/L   Glucose, Bld 144 (H) 70 - 99 mg/dL    Comment: Glucose reference range applies only to samples taken after fasting for at least 8 hours.   BUN 21 8 - 23 mg/dL   Creatinine, Ser 1.02 0.61 - 1.24 mg/dL   Calcium 10.1 8.9 - 10.3 mg/dL   GFR, Estimated >60 >60 mL/min    Comment: (NOTE) Calculated using the CKD-EPI Creatinine Equation (2021)    Anion gap 10 5 - 15    Comment: Performed at [facility] [address], [place], [place] [address]  CBC with Differential     Status: None   Collection Time: 03/21/22  8:22 PM  Result Value Ref Range   WBC 8.0 4.0 -  10.5 K/uL   RBC 4.54 4.22 - 5.81 MIL/uL   Hemoglobin 14.0 13.0 - 17.0 g/dL   HCT 42.5 39.0 - 52.0 %   MCV 93.6 80.0 - 100.0 fL   MCH 30.8 26.0 - 34.0 pg   MCHC 32.9 30.0 - 36.0 g/dL   RDW 12.5 11.5 - 15.5 %   Platelets 169 150 - 400 K/uL   nRBC 0.0 0.0 - 0.2 %   Neutrophils Relative % 64 %   Neutro Abs 5.2 1.7 - 7.7 K/uL   Lymphocytes Relative 25 %   Lymphs Abs 2.0 0.7 - 4.0 K/uL   Monocytes Relative 9 %   Monocytes Absolute 0.7 0.1 - 1.0 K/uL   Eosinophils Relative 1 %   Eosinophils Absolute 0.1 0.0 - 0.5 K/uL   Basophils Relative 0 %   Basophils Absolute 0.0 0.0 - 0.1 K/uL   Immature Granulocytes 1 %   Abs Immature Granulocytes 0.04 0.00 - 0.07 K/uL    Comment: Performed at [facility] [address], [place], [place] [address]  Protime-INR     Status: Abnormal   Collection Time: 03/21/22  8:22 PM  Result Value Ref Range   Prothrombin Time 16.0 (H) 11.4 - 15.2 seconds   INR 1.3 (H) 0.8 - 1.2    Comment: (NOTE) INR goal varies based on device and disease  states. Performed at [facility], [place] [address] [place], [place] [address]     DG Hand Complete Left  Result Date: 03/21/2022 CLINICAL DATA:  Sol with finger injury. EXAM: LEFT HAND - COMPLETE 3+ VIEW COMPARISON:  None Available. FINDINGS: Overlying dressings in the fourth and fifth digits. There is associated skin irregularity. Fracture of the fifth digit distal phalanx which is mildly displaced. There is also likely a fracture of the fourth digit distal phalanx, nondisplaced. No radiopaque foreign body allowing for overlying dressing. No additional fracture. IMPRESSION: 1. Mildly displaced fracture of the fifth digit distal phalanx. Probable nondisplaced fracture of the fourth digit distal phalanx. 2. Soft tissue injury about the fourth and fifth digits with overlying dressing in place. Electronically Signed   By: Keith Rake M.D.   On: 03/21/2022 19:22      Blood pressure (!) 178/67, pulse 64, temperature 98 F (36.7 C), temperature source Oral, resp. rate 15, height 5' 7"  (1.702 m), weight 83.9 kg, SpO2 96 %.  General appearance: alert, cooperative, and appears stated age Head: Normocephalic, without obvious abnormality, atraumatic Neck: supple, symmetrical, trachea midline Extremities: Intact sensation and capillary refill all digits except small finger with decreased sensation radial and ulnar sides.  +epl/fpl/io.  Laceration with skin/tissue loss on ulnar side of small finger.  Weak capillary refill distally.  Laceration goes into nail bed.  Able to lightly flex at dip joint.  Ring finger with laceration at ulnar side of digit.  Intact sensation on radial side of digit.  Intact capillary refill.  Able to flex dip joint. Pulses: 2+ and symmetric Skin: Skin color, texture, turgor normal. No rashes or lesions Neurologic: Grossly normal Incision/Wound: as above  Assessment/Plan Left ring and small finger table saw injury with open distal phalanx fractures and tissue loss.   Recommend OR for irrigation and debridement possible pinning of fractures, repair skin and nail bed vs revision amputation.  Patient wishes to have whatever will heal the quickest for him and understands that revision amputation of small finger may be best option particularly for small finger.  Risks, benefits and alternatives  of surgery were discussed including risks of blood loss, infection, damage to nerves/vessels/tendons/ligament/bone, failure of surgery, need for additional surgery, complication with wound healing, stiffness.  He voiced understanding of these risks and elected to proceed.    Leanora Cover 03/21/2022, 9:54 PM

## 2022-03-21 NOTE — Op Note (Signed)
NAME: Dakota Lewis RECORD NO: [id number] DATE OF BIRTH: 11-16-1932 FACILITY: [facility] LOCATION: [facility] OR PHYSICIAN: Tennis Must, MD   OPERATIVE REPORT   DATE OF PROCEDURE: 03/21/22    PREOPERATIVE DIAGNOSIS: Left ring and small finger tablesaw injuries   POSTOPERATIVE DIAGNOSIS: 1.  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  Left small finger open distal phalanx fracture with bone loss and soft tissue loss on ulnar side of digit   PROCEDURE: 1.  Left ring finger revision amputation 2.  Left small finger revision amputation   SURGEON:  Leanora Cover, M.D.   ASSISTANT: none   ANESTHESIA:  General   INTRAVENOUS FLUIDS:  Per anesthesia flow sheet.   ESTIMATED BLOOD LOSS:  Minimal.   COMPLICATIONS:  None.   SPECIMENS:  none   TOURNIQUET TIME:    Total Tourniquet Time Documented: Upper Arm (Left) - 44 minutes Total: Upper Arm (Left) - 44 minutes    DISPOSITION:  Stable to PACU.   INDICATIONS: 88 year old male states he sustained injury from a table saw earlier today to the ring and small fingers.  He was seen at the emergency department where radiographs were taken revealing fracture of the distal phalanx of the small finger with bone loss.  There is also a fracture at the distal phalanx tuft of the ring finger.  There was loss of tissue on the ulnar side of the small finger and some loss of tissue on the ulnar side of the ring finger.  Risks, benefits and alternatives of surgery were discussed including the risks of blood loss, infection, damage to nerves, vessels, tendons, ligaments, bone for surgery, need for additional surgery, complications with wound healing, continued pain, stiffness.  He voiced understanding of these risks and elected to proceed.  OPERATIVE COURSE:  After being identified preoperatively by myself,  the patient and I agreed on the procedure and site of the procedure.  The surgical site was marked.  Surgical consent had been signed.  Preoperative IV antibiotic prophylaxis was given. He was transferred to the operating room and placed on the operating table in supine position with the left upper extremity on an arm board.  General anesthesia was induced by the anesthesiologist.  Left upper extremity was prepped and draped in normal sterile orthopedic fashion.  A surgical pause was performed between the surgeons, anesthesia, and operating room staff and all were in agreement as to the patient, procedure, and site of procedure.  Tourniquet at the proximal aspect of the extremity was inflated to 250 mmHg after exsanguination of the arm with an Esmarch bandage.  The wounds were explored.  There is no gross contamination.  Hematoma was removed.  There was loss of tissue at the ulnar side of the small finger including tissue from the DIP distal.  There was bone loss of the majority of the ulnar side of the distal phalanx.  The proximal portion of the distal phalanx was intact including the flexor tendon insertion.  In the ring finger there was loss of tissue at the ulnar side of the finger.  This went down to the bone.  There was some loss of nailbed tissue on the ulnar side as well.  The nails were removed with a freer elevator.  The wounds were copiously irrigated with sterile saline.  A knife was used to sharply debride and remove any devitalized tissue including skin and subcutaneous tissues.  The remaining portion of distal phalanx of the small finger was removed including  the nailbed and germinal matrix.  The proximal portion of the distal phalanx was left to allow better grip strength.  The skin and subcutaneous tissue soft tissue envelope was left.  This was able to be brought over the end of the bone and reapproximated to other tissues providing good contour to the finger.  5-0 Monocryl suture was used in an interrupted horizontal mattress fashion to reapproximate the soft tissues.  Good reapproximation was obtained.  There was good overall  contour to the finger.  In the ring finger the volar soft tissues were mobilized allowing them to be brought around the edge of the distal phalanx and reapproximated to the edge of the nailbed on the ulnar side.  The nail had been removed with a freer elevator.  6-0 chromic suture was used in interrupted fashion to reapproximate soft tissue to the nailbed edge.  5-0 Monocryl suture was used to reapproximate skin edges.  Good reapproximation was obtained.  There is also a wound on the volar aspect of the finger that went into the subcutaneous tissues.  This wound was sutured with the 5-0 Monocryl suture as well.  There was a small wound into the subcutaneous tissues of the long finger distal phalanx.  No sutures were needed.  Digital blocks were performed with quarter percent plain Marcaine to aid in postoperative analgesia.  The wounds were all dressed with sterile Xeroform including placing Xeroform in the remaining nail fold of the ring finger.  There were then dressed with sterile 4 x 4's and wrapped with a Kerlix bandage.  A volar splint is placed and wrapped with Kerlix and Ace bandage.  The tourniquet was deflated at 44 minutes.  Fingertips were pink with brisk capillary refill after deflation of tourniquet.  The operative  drapes were broken down.  The patient was awoken from anesthesia safely.  He was transferred back to the stretcher and taken to PACU in stable condition.  I will see him back in the office in 1 week for postoperative followup.  I will give him a prescription for Norco 5/325 1 tab PO q6 hours prn pain, dispense # 20 and doxycycline 100 mg p.o. twice daily x7 days.   Leanora Cover, MD Electronically signed, 03/21/22

## 2022-03-21 NOTE — ED Notes (Signed)
Patient transported to OR holding [id number].

## 2022-03-21 NOTE — Anesthesia Preprocedure Evaluation (Addendum)
Anesthesia Evaluation  Patient identified by MRN, date of birth, ID band Patient awake    Reviewed: Allergy & Precautions, NPO status , Patient's Chart, lab work & pertinent test results  Airway Mallampati: II  TM Distance: >3 FB     Dental   Pulmonary former smoker,    breath sounds clear to auscultation       Cardiovascular hypertension,  Rhythm:Regular Rate:Normal     Neuro/Psych  Neuromuscular disease    GI/Hepatic Neg liver ROS, hiatal hernia,   Endo/Other  diabetes  Renal/GU      Musculoskeletal   Abdominal   Peds  Hematology   Anesthesia Other Findings   Reproductive/Obstetrics                            Anesthesia Physical Anesthesia Plan  ASA: 3  Anesthesia Plan: General   Post-op Pain Management:    Induction: Intravenous  PONV Risk Score and Plan: 2 and Treatment may vary due to age or medical condition and Ondansetron  Airway Management Planned: LMA and Oral ETT  Additional Equipment:   Intra-op Plan:   Post-operative Plan: Extubation in OR  Informed Consent: I have reviewed the patients History and Physical, chart, labs and discussed the procedure including the risks, benefits and alternatives for the proposed anesthesia with the patient or authorized representative who has indicated his/her understanding and acceptance.     Dental advisory given  Plan Discussed with: Anesthesiologist and CRNA  Anesthesia Plan Comments:        Anesthesia Quick Evaluation

## 2022-03-21 NOTE — ED Provider Notes (Signed)
Emergency Department Provider Note   I have reviewed the triage vital signs and the nursing notes.   HISTORY  Chief Complaint Extremity Laceration   HPI Dakota Lewis is a 88 y.o. male with past medical history reviewed below including prior history of PE/DVT on Eliquis, last dose this morning, presents emergency department with a left hand/finger injury.  He was using a table saw and the wood kicked back causing the blade to injure his left ring and little fingers.  He arrived by EMS.  Pain is moderate.  EMS reported a near amputation of the fingertip on the left little finger. Dressing applied and transported to the ED.    Past Medical History:  Diagnosis Date   Bilateral pulmonary embolism (HCC)    lovenox & coumadin thearpy   Chronic cough    DM (diabetes mellitus) ([place])    DVT (deep venous thrombosis) (HCC)    left lower   Gastrointestinal bleed    prior   H/O cardiovascular stress test 05/07/2011   normal study, low risk scan   H/O Doppler ultrasound 2013   venous duplex doppler   H/O Doppler ultrasound 2012   venous duplex doppler   H/O echocardiogram 03/14/2011   EF 65-70%   H/O exercise stress test 2006   neg bruce protocol excercise stress test   Hiatal hernia    Hypertension    Prostate cancer [facility])    s/p radiation    Review of Systems  Constitutional: No fever/chills Cardiovascular: Denies chest pain. Respiratory: Denies shortness of breath. Gastrointestinal: No abdominal pain.   Musculoskeletal: Negative for back pain. Skin: left 4th and 5th finger lacerations.  Neurological: Negative for headaches.   ____________________________________________   PHYSICAL EXAM:  VITAL SIGNS: ED Triage Vitals  Enc Vitals Group     BP 03/21/22 1848 (!) 191/83     Pulse Rate 03/21/22 1848 72     Resp 03/21/22 1848 19     Temp 03/21/22 1848 98 F (36.7 C)     Temp Source 03/21/22 1848 Oral     SpO2 03/21/22 1848 97 %     Weight 03/21/22 1849 185 lb (83.9  kg)     Height 03/21/22 1849 5' 7"  (1.702 m)   Constitutional: Alert and oriented. Well appearing and in no acute distress. Eyes: Conjunctivae are normal. Head: Atraumatic. Nose: No congestion/rhinnorhea. Mouth/Throat: Mucous membranes are moist.   Neck: No stridor.   Cardiovascular: Normal rate, regular rhythm. Good peripheral circulation. Grossly normal heart sounds.   Respiratory: Normal respiratory effort.  No retractions. Lungs CTAB. Gastrointestinal: Soft and nontender. No distention.  Musculoskeletal: Lacerations and decreased range of motion to the distal left fifth finger as pictured with nailbed injury. No visible bone on initial assessment.  Neurologic:  Normal speech and language. No gross focal neurologic deficits are appreciated.  Skin:  Skin is warm and dry.  Nailbed injury to the left fifth and fourth fingers as pictured.       ____________________________________________   LABS (all labs ordered are listed, but only abnormal results are displayed)  Labs Reviewed  BASIC METABOLIC PANEL - Abnormal; Notable for the following components:      Result Value   Glucose, Bld 144 (*)    All other components within normal limits  PROTIME-INR - Abnormal; Notable for the following components:   Prothrombin Time 16.0 (*)    INR 1.3 (*)    All other components within normal limits  CBC WITH DIFFERENTIAL/PLATELET  ____________________________________________  RADIOLOGY  DG Hand Complete Left  Result Date: 03/21/2022 CLINICAL DATA:  Sol with finger injury. EXAM: LEFT HAND - COMPLETE 3+ VIEW COMPARISON:  None Available. FINDINGS: Overlying dressings in the fourth and fifth digits. There is associated skin irregularity. Fracture of the fifth digit distal phalanx which is mildly displaced. There is also likely a fracture of the fourth digit distal phalanx, nondisplaced. No radiopaque foreign body allowing for overlying dressing. No additional fracture. IMPRESSION: 1. Mildly  displaced fracture of the fifth digit distal phalanx. Probable nondisplaced fracture of the fourth digit distal phalanx. 2. Soft tissue injury about the fourth and fifth digits with overlying dressing in place. Electronically Signed   By: Keith Rake M.D.   On: 03/21/2022 19:22    ____________________________________________   PROCEDURES  Procedure(s) performed:   Procedures  None ____________________________________________   INITIAL IMPRESSION / ASSESSMENT AND PLAN / ED COURSE  Pertinent labs & imaging results that were available during my care of the patient were reviewed by me and considered in my medical decision making (see chart for details).   This patient is Presenting for Evaluation of finger lacerations, which does require a range of treatment options, and is a complaint that involves a high risk of morbidity and mortality.  The Differential Diagnoses include open fracture, lacerations, dislocation.  Critical Interventions-    Medications  fentaNYL (SUBLIMAZE) injection 50 mcg (50 mcg Intravenous Given 03/21/22 2032)  fentaNYL (SUBLIMAZE) injection 50 mcg (50 mcg Intravenous Given 03/21/22 1905)  ceFAZolin (ANCEF) IVPB 1 g/50 mL premix (0 g Intravenous Stopped 03/21/22 1933)  Tdap (BOOSTRIX) injection 0.5 mL (0.5 mLs Intramuscular Given 03/21/22 1912)  sodium chloride 0.9 % bolus 500 mL (0 mLs Intravenous Stopped 03/21/22 1933)    Reassessment after intervention: Pain improved with medication.    I did obtain Additional Historical Information from family who have arrived at bedside.   I decided to review pertinent External Data, and in summary patient is on Eliquis with PE history.   Clinical Laboratory Tests Ordered, included no significant electrolyte disturbance or acute kidney injury.  No severe anemia.  Radiologic Tests Ordered, included hand x-ray. I independently interpreted the images and agree with radiology interpretation.   Cardiac Monitor Tracing  which shows NSR.   Social Determinants of Health Risk patient with a remote smoking history.   Consult complete with Hand Surgery, Dr. Fredna Dow. Plan to take patient to the OR for washout and repair.   Medical Decision Making: Summary:  Patient presents emergency department with saw injury to the left fourth and fifth digits.  Partial degloving type injury to the left fifth finger.  Patient's wounds were washed out in the ED at the bedside and dressed.  After discussion with hand surgery, as above, plan for the patient to go the OR for washout and repair. Family at bedside can drive him home after procedure.   Reevaluation with update and discussion with patient and family at bedside.   Disposition: transfer to OR  ____________________________________________  FINAL CLINICAL IMPRESSION(S) / ED DIAGNOSES  Final diagnoses:  Laceration of left hand, foreign body presence unspecified, initial encounter     Note:  This document was prepared using Dragon voice recognition software and may include unintentional dictation errors.  Nanda Quinton, MD, [facility] Emergency [facility]    Robi Mitter, [facility], MD 03/21/22 2146

## 2022-03-21 NOTE — Transfer of Care (Signed)
Immediate Anesthesia Transfer of Care Note  Patient: Dakota Lewis  Procedure(s) Performed: LEFT RING FINGER AND LEFT SMALL FINGER REVISION AMPUTATION (Left: Finger)  Patient Location: PACU  Anesthesia Type:General  Level of Consciousness: awake, alert  and oriented  Airway & Oxygen Therapy: Patient Spontanous Breathing and Patient connected to nasal cannula oxygen  Post-op Assessment: Report given to RN, Post -op Vital signs reviewed and stable and Patient moving all extremities  Post vital signs: Reviewed and stable  Last Vitals:  Vitals Value Taken Time  BP 150/70 03/21/22 2339  Temp    Pulse 67 03/21/22 2340  Resp 14 03/21/22 2340  SpO2 96 % 03/21/22 2340  Vitals shown include unvalidated device data.  Last Pain:  Vitals:   03/21/22 2037  TempSrc: Oral  PainSc:       Patients Stated Pain Goal: 3 (96/72/89 7915)  Complications: No notable events documented.

## 2022-03-21 NOTE — Anesthesia Procedure Notes (Signed)
Procedure Name: Intubation Date/Time: 03/21/2022 10:23 PM  Performed by: Karmyn Lowman T, CRNAPre-anesthesia Checklist: Patient identified, Emergency Drugs available, Suction available and Patient being monitored Patient Re-evaluated:Patient Re-evaluated prior to induction Oxygen Delivery Method: Circle system utilized Preoxygenation: Pre-oxygenation with 100% oxygen Induction Type: IV induction, Rapid sequence and Cricoid Pressure applied Ventilation: Mask ventilation without difficulty Laryngoscope Size: Mac and 3 Grade View: Grade II Tube type: Oral Tube size: 7.5 mm Number of attempts: 1 Airway Equipment and Method: Stylet Placement Confirmation: ETT inserted through vocal cords under direct vision, positive ETCO2 and breath sounds checked- equal and bilateral Secured at: 23 cm Tube secured with: Tape Dental Injury: Teeth and Oropharynx as per pre-operative assessment

## 2022-03-21 NOTE — ED Notes (Signed)
Report given to CRNA .

## 2022-03-22 ENCOUNTER — Encounter (HOSPITAL_COMMUNITY): Payer: Self-pay | Admitting: Orthopedic Surgery

## 2022-03-22 ENCOUNTER — Telehealth: Payer: Self-pay | Admitting: Adult Health

## 2022-03-22 ENCOUNTER — Other Ambulatory Visit: Payer: Self-pay

## 2022-03-22 MED ORDER — AMLODIPINE BESYLATE 10 MG PO TABS: 10.0000 mg | ORAL_TABLET | Freq: Every day | ORAL | 1 refills | Status: DC

## 2022-03-22 NOTE — Anesthesia Postprocedure Evaluation (Signed)
Anesthesia Post Note  Patient: Dakota Lewis  Procedure(s) Performed: LEFT RING FINGER AND LEFT SMALL FINGER REVISION AMPUTATION (Left: Finger)     Patient location during evaluation: PACU Anesthesia Type: General Level of consciousness: awake Pain management: pain level controlled Vital Signs Assessment: post-procedure vital signs reviewed and stable Respiratory status: spontaneous breathing Cardiovascular status: stable Postop Assessment: no apparent nausea or vomiting Anesthetic complications: no   No notable events documented.  Last Vitals:  Vitals:   03/22/22 0010 03/22/22 0020  BP: (!) 154/78 (!) 153/89  Pulse: 60 60  Resp: 12 15  Temp:  36.7 C  SpO2: 98% 98%    Last Pain:  Vitals:   03/21/22 2340  TempSrc:   PainSc: 0-No pain                 Satchel Heidinger

## 2022-03-22 NOTE — Telephone Encounter (Signed)
Pt daughter stated that p just need a refill on the smlodipine. Rx sent to the pharmacy not further action needed.

## 2022-03-22 NOTE — Telephone Encounter (Signed)
Daughter seeking clarification whether patient should have stopped taking one of his high blood pressure medications. Not sure of the name. States his bp has been elevated. FYI States patient had incident where a table saw took off part of 2 of his fingers, treated at [facility], did emergency surgery

## 2022-04-03 ENCOUNTER — Ambulatory Visit
Admission: RE | Admit: 2022-04-03 | Discharge: 2022-04-03 | Disposition: A | Discharge status: Home / Self Care | Payer: Medicare HMO | Source: Ambulatory Visit | Attending: Nurse Practitioner | Admitting: Nurse Practitioner

## 2022-04-03 DIAGNOSIS — K7581 Nonalcoholic steatohepatitis (NASH): Secondary | ICD-10-CM | POA: Diagnosis not present

## 2022-04-03 DIAGNOSIS — K824 Cholesterolosis of gallbladder: Secondary | ICD-10-CM | POA: Diagnosis not present

## 2022-04-03 DIAGNOSIS — K7460 Unspecified cirrhosis of liver: Secondary | ICD-10-CM

## 2022-05-07 ENCOUNTER — Telehealth: Payer: Self-pay | Admitting: Pharmacist

## 2022-05-07 NOTE — Chronic Care Management (AMB) (Signed)
    Chronic Care Management Pharmacy Assistant   Name: Dakota Lewis  MRN: [id number] DOB: 1933-02-08  05/08/2022 APPOINTMENT [place] and his daughter were reminded to have all medications, supplements and any blood glucose and blood pressure readings available for review with Jeni Salles, Pharm. D, at his telephone visit on 05/08/2022 at 10:30.  Care Gaps: AWV - completed 12/04/2021 Last BP - 130/72 on 02/27/2022 Last A1C - 6.6 on 10/18/2021 Eye exam - overdue Covid booster - overdue HGA1C - overdue Flu - due  Star Rating Drug: Losartan HCTZ 100/25 mg - last filled 03/27/2022 90 DS at [facility]  Any gaps in medications fill history? No  Gennie Alma [facility]  Catering manager [phone]

## 2022-05-08 ENCOUNTER — Ambulatory Visit (INDEPENDENT_AMBULATORY_CARE_PROVIDER_SITE_OTHER): Payer: Medicare HMO | Admitting: Pharmacist

## 2022-05-08 ENCOUNTER — Other Ambulatory Visit: Payer: Self-pay | Admitting: Adult Health

## 2022-05-08 DIAGNOSIS — I10 Essential (primary) hypertension: Secondary | ICD-10-CM

## 2022-05-08 DIAGNOSIS — E119 Type 2 diabetes mellitus without complications: Secondary | ICD-10-CM

## 2022-05-08 MED ORDER — OMEPRAZOLE 20 MG PO CPDR
20.0000 mg | DELAYED_RELEASE_CAPSULE | Freq: Every day | ORAL | 3 refills | Status: DC
Start: 2022-05-08 — End: 2022-06-11

## 2022-05-08 NOTE — Progress Notes (Signed)
Chronic Care Management Pharmacy Note  05/08/2022 Name:  LADAVION SAVITZ MRN:  [id number] DOB:  1933/02/17  Summary: A1c at goal < 7% BP at goal < 140/90   Recommendations/Changes made from today's visit: -Recommended cutting out [place]. Dew for blood sugar lowering and improvement of GERD symptoms -Deactivated losartan and HCTZ separate rxs at pharmacy as they continued to fill them separately   Plan: BP and DM assessment in 3 months Follow up in 6 months  Subjective: EBBIE CHERRY is an 88 y.o. year old male who is a primary patient of [name], NP.  The CCM team was consulted for assistance with disease management and care coordination needs.    Engaged with patient by telephone for follow up visit in response to provider referral for pharmacy case management and/or care coordination services.   Consent to Services:  The patient was given information about Chronic Care Management services, agreed to services, and gave verbal consent prior to initiation of services.  Please see initial visit note for detailed documentation.   Patient Care Team: [name], NP as PCP - General (Family Medicine) Franchot Gallo, MD as Consulting Physician (Urology) Viona Gilmore, [facility] as Pharmacist (Pharmacist)  Recent office visits: 02/27/22 [name], NP: Patient presented for shoulder pain. Administered medrol injection for right and left shoulders. Prescribed doxycycline for tick bites.  12/04/21 Glenna Durand LPN - Medicare annual wellness exam   11/22/21 [name] NP: Patient was seen for chronic right shoulder pain. No medication changes. No follow up noted.   Recent consult visits: 05/01/22 Leanora Cover, MD (ortho): Patient presented for follow up for left ring and small finger revision amputations. Patient is 6 weeks post-op.  04/10/22 Leanora Cover, MD (ortho): Patient presented for 3 week post-op follow up for left ring and small finger revision amputations.  03/27/22  Delma Freeze, PA-C (ortho):  Patient presented for 1 week post-op follow up for left ring and small finger revision amputations. Refilled Norco PRN.  03/20/22 Roosevelt Locks, NP (transplant): Patient presented for NASH follow up.  Follow up in 6 months.  10/23/21 Cand[facility] Schatz ([facility]): Patient presented for hearing aid check.  [facility]/21 [facility]: [facility] presented for routine chronic conditions follow up. Lab work: A1c 7.3, Mag 1.6, TGs 211, microalbumin/creatinine 79.2.  05/01/21 [facility]): Patient presented for eye exam follow up.   Hospital visits: 03/21/22 Patient admitted to [facility] for 5 hours due to saw injury and need for left ring finger and left small finger revision amputation.  Objective:  Lab Results  Component Value Date   CREATININE 1.02 03/21/2022   BUN 21 03/21/2022   GFR 61.43 07/12/2021   GFRNONAA >60 03/21/2022   GFRAA  03/15/2011    >60        The eGFR has been calculated using the MDRD equation. This calculation has not been validated in all clinical situations. eGFR's persistently <60 mL/min signify possible Chronic Kidney Disease.   NA 139 03/21/2022   K 4.5 03/21/2022   CALCIUM 10.1 03/21/2022   CO2 25 03/21/2022   GLUCOSE 144 (H) 03/21/2022    Lab Results  Component Value Date/Time   HGBA1C 6.6 (A) 10/18/2021 02:05 PM   HGBA1C 7.2 (H) 07/12/2021 09:44 AM   HGBA1C 6.8 06/16/2020 12:00 AM   HGBA1C 6.3 07/07/2019 09:20 AM   GFR 61.43 07/12/2021 09:44 AM   GFR 68.71 07/07/2019 09:20 AM    Last diabetic Eye exam:  Lab Results  Component  Value Date/Time   HMDIABEYEEXA No Retinopathy 04/06/2016 12:00 AM    Last diabetic Foot exam:  Lab Results  Component Value Date/Time   HMDIABFOOTEX Abstracted/Dr. Marily Memos 12/19/2016 12:00 AM     Lab Results  Component Value Date   CHOL 158 07/12/2021   HDL 53.70 07/12/2021   LDLCALC 67 07/12/2021   LDLDIRECT 100.0 07/07/2019   TRIG 188.0 (H) 07/12/2021   CHOLHDL 3  07/12/2021       Latest Ref Rng & Units 07/12/2021    9:44 AM 06/16/2020   12:00 AM 07/07/2019    9:20 AM  Hepatic Function  Total Protein 6.0 - 8.3 g/dL 7.0   7.2   Albumin 3.5 - 5.2 g/dL 4.3  3.9     4.4   AST 0 - 37 U/L 27  24     18    ALT 0 - 53 U/L 31  49     25   Alk Phosphatase 39 - 117 U/L 70   76   Total Bilirubin 0.2 - 1.2 mg/dL 0.8   0.7   Bilirubin, Direct 0.01 - 0.4  0.2          This result is from an external source.    Lab Results  Component Value Date/Time   TSH 0.78 07/12/2021 09:44 AM   TSH 1.21 06/16/2020 12:00 AM   TSH 1.15 07/07/2019 09:20 AM       Latest Ref Rng & Units 03/21/2022    8:22 PM 07/12/2021    9:44 AM 06/16/2020   12:00 AM  CBC  WBC 4.0 - 10.5 K/uL 8.0  4.6  6.7      Hemoglobin 13.0 - 17.0 g/dL 14.0  13.1  14.1      Hematocrit 39.0 - 52.0 % 42.5  39.1  42      Platelets 150 - 400 K/uL 169  200.0  196         This result is from an external source.    Lab Results  Component Value Date/Time   VD25OH 58.53 05/25/2018 08:24 AM   VD25OH 48.5 03/28/2017 12:00 AM    Clinical ASCVD: No  The ASCVD Risk score (Arnett DK, et al., 2019) failed to calculate for the following reasons:   The 2019 ASCVD risk score is only valid for ages 59 to 53       02/27/2022    8:18 AM 12/04/2021    8:32 AM 10/18/2021   10:48 AM  Depression screen PHQ 2/9  Decreased Interest 0 0 0  Down, Depressed, Hopeless 0 0 0  PHQ - 2 Score 0 0 0  Altered sleeping 1  2  Tired, decreased energy 1  1  Change in appetite 2  2  Feeling bad or failure about yourself  0  0  Trouble concentrating 0  0  Moving slowly or fidgety/restless 0  0  Suicidal thoughts 0  0  PHQ-9 Score 4  5  Difficult doing work/chores Not difficult at all  Not difficult at all     Social History   Tobacco Use  Smoking Status Former   Packs/day: 0.75   Years: 20.00   Total pack years: 15.00   Types: Cigarettes   Passive exposure: Past  Smokeless Tobacco Never   BP Readings from Last  3 Encounters:  03/22/22 (!) 153/89  02/27/22 130/72  11/22/21 140/70   Pulse Readings from Last 3 Encounters:  03/22/22 60  02/27/22 65  11/22/21 71  Wt Readings from Last 3 Encounters:  03/21/22 185 lb (83.9 kg)  02/27/22 189 lb 6.1 oz (85.9 kg)  12/04/21 180 lb (81.6 kg)   BMI Readings from Last 3 Encounters:  03/21/22 28.98 kg/m  02/27/22 29.22 kg/m  12/04/21 27.78 kg/m    Assessment/Interventions: Review of patient past medical history, allergies, medications, health status, including review of consultants reports, laboratory and other test data, was performed as part of comprehensive evaluation and provision of chronic care management services.   SDOH:  (Social Determinants of Health) assessments and interventions performed: Yes (last 12/04/21)  SDOH Screenings   Alcohol Screen: Not on file  Depression (PHQ2-9): Low Risk  (02/27/2022)   Depression (PHQ2-9)    PHQ-2 Score: 4  Financial Resource Strain: Low Risk  (12/04/2021)   Overall Financial Resource Strain (CARDIA)    Difficulty of Paying Living Expenses: Not hard at all  Food Insecurity: No Food Insecurity (12/04/2021)   Hunger Vital Sign    Worried About Running Out of Food in the Last Year: Never true    Ran Out of Food in the Last Year: Never true  Housing: Not on file  Physical Activity: Inactive (12/04/2021)   Exercise Vital Sign    Days of Exercise per Week: 0 days    Minutes of Exercise per Session: 0 min  Social Connections: Not on file  Stress: No Stress Concern Present (12/04/2021)   [place]    Feeling of Stress : Not at all  Tobacco Use: Medium Risk (03/22/2022)   Patient History    Smoking Tobacco Use: Former    Smokeless Tobacco Use: Never    Passive Exposure: Past  Transportation Needs: No Transportation Needs (12/04/2021)   PRAPARE - Hydrologist (Medical): No    Lack of Transportation  (Non-Medical): No    CCM Care Plan  Allergies  Allergen Reactions   Lidocaine     ANTI ITCH CREAM, NO SPECIFICATIONS IN RECORDS.   Lisinopril     cough    Medications Reviewed Today     Reviewed by Viona Gilmore, [facility] (Pharmacist) on 05/08/22 at 1042  Med List Status: <None>   Medication Order Taking? Sig Documenting Provider Last Dose Status Informant  amLODipine (NORVASC) 10 MG tablet [id number]  Take 1 tablet (10 mg total) by mouth daily. Nafziger, Tommi Rumps, NP  Active   apixaban (ELIQUIS) 5 MG TABS tablet [id number] No Take 5 mg by mouth 2 (two) times daily. [provider] Taking Active   Ascorbic Acid (VITAMIN C) 1000 MG tablet [id number] No Take 1,000 mg by mouth daily. [provider] Taking Active Self  blood glucose meter kit and supplies KIT [id number] No Dispense based on patient and insurance preference. Use up to four times daily as directed. Nafziger, Tommi Rumps, NP Taking Active   Blood Glucose Monitoring Suppl (ONE TOUCH ULTRA 2) w/Device Drucie Opitz [id number] No USE TO TEST BLOOD GLUCOSE TWICE DAILY Nafziger, Tommi Rumps, NP Taking Active   clotrimazole-betamethasone (LOTRISONE) cream [id number]  Apply 1 application. topically daily. Thin layer twice a day [name], NP  Active     Discontinued 05/08/22 1041 (Completed Course)   fluticasone (FLONASE) 50 MCG/ACT nasal spray [id number]  Place 2 sprays into both nostrils daily. [name], NP  Active   glucose blood test strip [id number] No Use as instructed [name], NP Taking Active     Discontinued 05/08/22 1042 (Completed Course)   Lancets 28G MISC  [id number] No 1 Stick by Does not apply route daily. Nafziger, Tommi Rumps, NP Taking Active   losartan-hydrochlorothiazide (HYZAAR) 100-25 MG tablet [id number] No Take 1 tablet by mouth daily. Nafziger, Tommi Rumps, NP Taking Active   omeprazole (PRILOSEC) 20 MG capsule [id number] No Take 20 mg by mouth daily. [provider] Taking Active Self           Med Note Kipp Brood,  Nili Honda G   Wed Jul 04, 2021  4:28 PM)    polyethylene glycol powder (GLYCOLAX/MIRALAX) 17 GM/SCOOP powder [id number] No TAKE 17 GRAMS BY MOUTH DAILY [provider] Taking Active   potassium chloride (MICRO-K) 10 MEQ CR capsule [id number] No Take 1 capsule (10 mEq total) by mouth daily. [name], NP Taking Active             Patient Active Problem List   Diagnosis Date Noted   Diet-controlled diabetes mellitus ([place]) 12/30/2017   Atypical chest pain 11/28/2017   Pulmonary embolism ([place]) 07/09/2017   Prostate cancer ([place])    Hypertension     Immunization History  Administered Date(s) Administered   Fluad Quad(high Dose 65+) 07/07/2019, 07/07/2020, 07/12/2021   Influenza Whole 07/18/2017   Influenza, High Dose Seasonal PF 07/06/2013, 07/22/2014, 06/07/2016, 05/07/2017, 07/21/2017   Influenza,inj,Quad PF,6+ Mos 07/18/2017, 07/22/2018   Influenza-Unspecified 07/13/2001, 07/07/2002, 07/08/2003, 08/02/2004, 07/07/2005, 07/26/2005, 08/07/2006, 07/23/2007, 06/07/2008, 07/07/2009, 07/08/2011, 07/07/2012   Moderna SARS-COV2 Booster Vaccination 09/08/2020   Moderna Sars-Covid-2 Vaccination 10/27/2019, 11/24/2019, 09/08/2020   Pneumococcal Conjugate-13 10/21/2013   Pneumococcal Polysaccharide-23 12/19/2016   Pneumococcal-Unspecified 05/22/2002, 10/21/2013   Td 08/02/2014   Tdap 09/19/2012, 03/21/2022   Zoster Recombinat (Shingrix) 07/17/2019, 12/11/2019, 07/16/2020   Zoster, Live 11/02/2012   Patient reports he had an accident with the table saw and had chopped off part of one finger and the nail of the other. He isn't having much pain right now  Patient inquired about Tommi Rumps writing a prescription for his omeprazole as he was hoping it would be free at the pharmacy through his insurance rather than at the [place].  Patient just sometimes staggers and catches himself. He reports it doesn't happen often and hasn't found a cause or solution for it yet.  Conditions to be  addressed/monitored:  Hypertension, Hyperlipidemia, Diabetes, GERD, Osteoarthritis and History of PE, prostate cancer, cirrhosis  Conditions addressed this visit: Hypertension, GERD, diabetes  Care Plan : CCM Pharmacy Care Plan  Updates made by Viona Gilmore, [place] since 05/08/2022 12:00 AM     Problem: Problem: Hypertension, Hyperlipidemia, Diabetes, GERD, Osteoarthritis and History of PE, prostate cancer, cirrhosis      Long-Range Goal: Patient-Specific Goal   Start Date: 02/21/2021  Expected End Date: 02/21/2022  Recent Progress: On track  Priority: High  Note:   Current Barriers:  Unable to independently monitor therapeutic efficacy  Pharmacist Clinical Goal(s):  Patient will achieve adherence to monitoring guidelines and medication adherence to achieve therapeutic efficacy through collaboration with PharmD and provider.   Interventions: 1:1 collaboration with [name], NP regarding development and update of comprehensive plan of care as evidenced by provider attestation and co-signature Inter-disciplinary care team collaboration (see longitudinal plan of care) Comprehensive medication review performed; medication list updated in electronic medical record  Hypertension (BP goal <140/90) -Controlled -Current treatment: Amlodipine 57m, 1 tablet daily - in PM - Appropriate, Effective, Safe, Accessible Losartan-HCTZ 100-2104m 1 tablet daily - in AM - Appropriate, Effective, Safe, Accessible -Medications previously tried: none  -Current home readings: 110-140s (checking in mornings) - did check it  with the office - arm cuff; 3-4 times a week -Current dietary habits: patient doesn't eat out often and does not eat frozen foods; his breakfast consists of a jimmy dean sausage biscuit -Current exercise habits: 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 -Denies hypotensive/hypertensive symptoms -Educated on Exercise goal of 150 minutes per  week; Importance of home blood pressure monitoring; Proper BP monitoring technique; Symptoms of hypotension and importance of maintaining adequate hydration; -Counseled to monitor BP at home weekly, document, and provide log at future appointments -Counseled on diet and exercise extensively Recommended to continue current medication  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 Dew (2-3 per day), diet Dr. 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  History of PE (Goal: prevent blood clots) -Controlled -Current treatment  Eliquis 69m, 1 tablet twice daily - Appropriate, Effective, Safe, Accessible -Medications previously tried: warfarin -Recommended to continue current medication  GERD (Goal: minimize symptoms of heartburn/acid reflux) -Controlled -Current treatment  Omeprazole 20 mg, 1 capsule daily before bedtime - Appropriate, Effective, Safe, Accessible -Medications previously tried: none   -Counseled on non-pharmacologic management of symptoms such as elevating the head of your bed, avoiding eating 2-3 hours before bed, avoiding triggering foods such as acidic, spicy, or fatty foods, eating smaller meals, and wearing clothes that are loose around the waist  Osteoarthritis (Goal: minimize pain) -Controlled -Current treatment  Tylenol 5049m 2 tablets twice daily as needed for pain - Appropriate, Effective, Safe, Accessible CBD oil as needed -Medications previously tried: none  -Recommended to continue current medication Counseled on maximum daily amount of Tylenol of 200032ms patient has cirrhosis.  Health Maintenance -Vaccine gaps: none -Current therapy:  Vitamin C 1000 mg 1 tablet once daily  Vitamin D 5000 units 1 tablet daily -Educated on Herbal supplement research is limited and benefits usually cannot be proven Cost vs benefit of each product must be carefully weighed by individual consumer Supplements may interfere with prescription drugs -Patient is satisfied with current therapy and denies issues -Recommended to continue current medication  Patient Goals/Self-Care Activities Patient will:  - take medications as prescribed check blood pressure weekly, document, and provide at future appointments target a minimum of 150 minutes of moderate intensity exercise weekly  Follow Up Plan: Telephone follow up appointment with care management team member scheduled for: 1 year       Medication Assistance: None required.  Patient affirms current coverage meets needs.  Compliance/Adherence/Medication fill history: Care Gaps: Eye exam, COVID booster, A1c, influenza vaccine Last BP - 130/72 on 02/27/2022 Last A1C - 6.6 on 10/18/2021   Star-Rating Drugs: Losartan HCTZ  100/25 mg - last filled 03/27/2022 90 DS at [facility]  Patient's preferred pharmacy is:  [facility] [address] [place] [address] Phone: [phone]  Fax: [phone]  [facility] - [address], [address] [place] [address] [place] [address] Phone: [phone] Fax: [address], [address] [place] [address] Phone: [phone] Fax: [phone]   Uses pill box? Yes - 3 pill boxes (1 in AM, 1 in afternoon, 1 in PM) Pt endorses 99% compliance  We discussed: Current pharmacy is preferred with insurance plan and patient is satisfied with pharmacy services Patient decided to: Continue current medication management strategy  Care Plan and Follow Up Patient Decision:  Patient agrees to Care Plan and Follow-up.  Plan: Telephone follow up appointment with care management team member scheduled for:  1 year  Jeni Salles, PharmD [place] Pharmacist [employer] at [place] [phone]

## 2022-06-06 DIAGNOSIS — I10 Essential (primary) hypertension: Secondary | ICD-10-CM | POA: Diagnosis not present

## 2022-06-06 DIAGNOSIS — M1990 Unspecified osteoarthritis, unspecified site: Secondary | ICD-10-CM

## 2022-06-06 DIAGNOSIS — E1159 Type 2 diabetes mellitus with other circulatory complications: Secondary | ICD-10-CM | POA: Diagnosis not present

## 2022-06-11 ENCOUNTER — Ambulatory Visit (INDEPENDENT_AMBULATORY_CARE_PROVIDER_SITE_OTHER): Payer: Medicare HMO | Admitting: Adult Health

## 2022-06-11 ENCOUNTER — Encounter: Payer: Self-pay | Admitting: Adult Health

## 2022-06-11 VITALS — BP 140/82 | HR 68 | Temp 97.0°F | Ht 67.0 in | Wt 196.0 lb

## 2022-06-11 DIAGNOSIS — M5431 Sciatica, right side: Secondary | ICD-10-CM

## 2022-06-11 MED ORDER — METHYLPREDNISOLONE 4 MG PO TBPK: ORAL_TABLET | ORAL | 0 refills | Status: DC

## 2022-06-11 NOTE — Patient Instructions (Signed)
It sounds like you have sciatica pain. I have sent in some prednisone for you which will help with the pain and discomfort

## 2022-06-11 NOTE — Progress Notes (Signed)
Subjective:    Patient ID: Dakota Lewis, male    DOB: 07/08/1933, 88 y.o.   MRN: [id number]  HPI 88 year old male who  has a past medical history of Bilateral pulmonary embolism ([place]), Chronic cough, DM (diabetes mellitus) ([place]), DVT (deep venous thrombosis) ([place]), Gastrointestinal bleed, H/O cardiovascular stress test (05/07/2011), H/O Doppler ultrasound (2013), H/O Doppler ultrasound (2012), H/O echocardiogram (03/14/2011), H/O exercise stress test (2006), Hiatal hernia, Hypertension, and Prostate cancer ([place]).  He presents to the office today for an acute issue.  This symptoms started about 1 week ago at which point it was worse than it is today.  Reports last week "I felt like my right leg was going to give out on me".  His symptoms included pain that radiated from his buttock down the outside of his right leg past his knee.  This discomfort was intermittent throughout the week.  Worse when he was sitting for long periods of time.  Pain was felt as a "burning pain that almost felt like my leg was falling asleep".  He denies trauma or falls   Review of Systems See HPI   Past Medical History:  Diagnosis Date   Bilateral pulmonary embolism (HCC)    lovenox & coumadin thearpy   Chronic cough    DM (diabetes mellitus) ([place])    DVT (deep venous thrombosis) (HCC)    left lower   Gastrointestinal bleed    prior   H/O cardiovascular stress test 05/07/2011   normal study, low risk scan   H/O Doppler ultrasound 2013   venous duplex doppler   H/O Doppler ultrasound 2012   venous duplex doppler   H/O echocardiogram 03/14/2011   EF 65-70%   H/O exercise stress test 2006   neg bruce protocol excercise stress test   Hiatal hernia    Hypertension    Prostate cancer [facility])    s/p radiation    Social History   Socioeconomic History   Marital status: Married    Spouse name: Not on file   Number of children: 3   Years of education: Not on file   Highest education level: Not on file   Occupational History   Not on file  Tobacco Use   Smoking status: Former    Packs/day: 0.75    Years: 20.00    Total pack years: 15.00    Types: Cigarettes    Passive exposure: Past   Smokeless tobacco: Never  Vaping Use   Vaping Use: Never used  Substance and Sexual Activity   Alcohol use: No   Drug use: No   Sexual activity: Not on file  Other Topics Concern   Not on file  Social History Narrative   Not on file   Social Determinants of Health   Financial Resource Strain: Low Risk  (12/04/2021)   Overall Financial Resource Strain (CARDIA)    Difficulty of Paying Living Expenses: Not hard at all  Food Insecurity: No Food Insecurity (12/04/2021)   Hunger Vital Sign    Worried About Running Out of Food in the Last Year: Never true    Ran Out of Food in the Last Year: Never true  Transportation Needs: No Transportation Needs (12/04/2021)   PRAPARE - Hydrologist (Medical): No    Lack of Transportation (Non-Medical): No  Physical Activity: Inactive (12/04/2021)   Exercise Vital Sign    Days of Exercise per Week: 0 days    Minutes of Exercise per  Session: 0 min  Stress: No Stress Concern Present (12/04/2021)   [place]    Feeling of Stress : Not at all  Social Connections: Not on file  Intimate Partner Violence: Not on file    Past Surgical History:  Procedure Laterality Date   AMPUTATION Left 03/21/2022   Procedure: LEFT RING FINGER AND LEFT SMALL FINGER REVISION AMPUTATION;  Surgeon: Leanora Cover, MD;  Location: [place];  Service: Orthopedics;  Laterality: Left;   event monitor     2012   HERNIA REPAIR Bilateral    1991, 1992   HIATAL HERNIA REPAIR     1997   IVC FILTER INSERTION     PROSTATE SURGERY      Family History  Problem Relation Age of Onset   Stroke Mother    Kidney disease Father    Heart disease Brother    Arthritis Brother    Heart disease Brother     Prostate cancer Brother     Allergies  Allergen Reactions   Lidocaine     ANTI ITCH CREAM, NO SPECIFICATIONS IN RECORDS.   Lisinopril     cough    Current Outpatient Medications on File Prior to Visit  Medication Sig Dispense Refill   amLODipine (NORVASC) 10 MG tablet Take 1 tablet (10 mg total) by mouth daily. 90 tablet 1   apixaban (ELIQUIS) 5 MG TABS tablet Take 5 mg by mouth 2 (two) times daily.     blood glucose meter kit and supplies KIT Dispense based on patient and insurance preference. Use up to four times daily as directed. 1 each 0   Blood Glucose Monitoring Suppl (ONE TOUCH ULTRA 2) w/Device KIT USE TO TEST BLOOD GLUCOSE TWICE DAILY 1 each 0   Carboxymethylcellulose Sod PF (THERATEARS PF) 0.25 % SOLN INSTILL 1 DROP IN BOTH EYES FOUR TIMES A DAY AS NEEDED     clotrimazole-betamethasone (LOTRISONE) cream Apply 1 application. topically daily. Thin layer twice a day 45 g 2   fluticasone (FLONASE) 50 MCG/ACT nasal spray Place 2 sprays into both nostrils daily. 16 g 6   glucose blood test strip Use as instructed 300 each 3   Lancets 28G MISC 1 Stick by Does not apply route daily. 100 each 3   losartan-hydrochlorothiazide (HYZAAR) 100-25 MG tablet Take 1 tablet by mouth daily. 90 tablet 3   polyethylene glycol (MIRALAX / GLYCOLAX) 17 g packet TAKE 17 GRAMS BY MOUTH DAILY     potassium chloride (MICRO-K) 10 MEQ CR capsule Take 1 capsule (10 mEq total) by mouth daily. 90 capsule 3   Ascorbic Acid (VITAMIN C) 1000 MG tablet Take 1 tablet by mouth daily.     Cholecalciferol 50 MCG (2000 UT) CAPS 1 capsule Orally Once a day     omeprazole (PRILOSEC) 20 MG capsule 1 capsule Orally twice a day     No current facility-administered medications on file prior to visit.    BP (!) 140/82   Pulse 68   Temp (!) 97 F (36.1 C) (Oral)   Ht _0  (1.702 m)   Wt 196 lb (88.9 kg)   SpO2 97%   BMI 30.70 kg/m       Objective:   Physical Exam Vitals and nursing note reviewed.   Constitutional:      Appearance: Normal appearance. He is obese.  Musculoskeletal:     Right hip: No tenderness, bony tenderness or crepitus. Normal range of motion. Normal strength.  Right knee: Bony tenderness present. No swelling or crepitus. Normal range of motion. No tenderness.     Instability Tests: Anterior drawer test negative. Posterior drawer test negative. Anterior Lachman test negative. Medial McMurray test negative.  Skin:    General: Skin is warm and dry.     Capillary Refill: Capillary refill takes less than 2 seconds.  Neurological:     General: No focal deficit present.     Mental Status: He is alert and oriented to person, place, and time.  Psychiatric:        Mood and Affect: Mood normal.        Behavior: Behavior normal.        Thought Content: Thought content normal.       Assessment & Plan:  1. Sciatica of right side - symptoms consistent with sciatica - Follow up if not resolved with prednisone therapy - methylPREDNISolone (MEDROL DOSEPAK) 4 MG TBPK tablet; Take as directed  Dispense: 21 tablet; Refill: 0  [name], NP

## 2022-07-13 ENCOUNTER — Other Ambulatory Visit: Payer: Self-pay | Admitting: Adult Health

## 2022-07-13 DIAGNOSIS — I10 Essential (primary) hypertension: Secondary | ICD-10-CM

## 2022-07-17 ENCOUNTER — Encounter: Payer: Medicare HMO | Admitting: Adult Health

## 2022-08-10 DIAGNOSIS — R531 Weakness: Secondary | ICD-10-CM | POA: Diagnosis not present

## 2022-08-10 DIAGNOSIS — E1165 Type 2 diabetes mellitus with hyperglycemia: Secondary | ICD-10-CM | POA: Diagnosis not present

## 2022-08-10 DIAGNOSIS — E0590 Thyrotoxicosis, unspecified without thyrotoxic crisis or storm: Secondary | ICD-10-CM | POA: Diagnosis not present

## 2022-08-10 DIAGNOSIS — R634 Abnormal weight loss: Secondary | ICD-10-CM | POA: Diagnosis not present

## 2022-08-10 DIAGNOSIS — Z79899 Other long term (current) drug therapy: Secondary | ICD-10-CM | POA: Diagnosis not present

## 2022-08-10 DIAGNOSIS — J9811 Atelectasis: Secondary | ICD-10-CM | POA: Diagnosis not present

## 2022-08-10 DIAGNOSIS — E871 Hypo-osmolality and hyponatremia: Secondary | ICD-10-CM | POA: Diagnosis not present

## 2022-08-10 DIAGNOSIS — E860 Dehydration: Secondary | ICD-10-CM | POA: Diagnosis not present

## 2022-08-10 DIAGNOSIS — Z7901 Long term (current) use of anticoagulants: Secondary | ICD-10-CM | POA: Diagnosis not present

## 2022-08-10 DIAGNOSIS — K5730 Diverticulosis of large intestine without perforation or abscess without bleeding: Secondary | ICD-10-CM | POA: Diagnosis not present

## 2022-08-10 DIAGNOSIS — E042 Nontoxic multinodular goiter: Secondary | ICD-10-CM | POA: Diagnosis not present

## 2022-08-10 DIAGNOSIS — R739 Hyperglycemia, unspecified: Secondary | ICD-10-CM | POA: Diagnosis not present

## 2022-08-10 DIAGNOSIS — Z86711 Personal history of pulmonary embolism: Secondary | ICD-10-CM | POA: Diagnosis not present

## 2022-08-10 DIAGNOSIS — M1990 Unspecified osteoarthritis, unspecified site: Secondary | ICD-10-CM | POA: Diagnosis not present

## 2022-08-10 DIAGNOSIS — Z888 Allergy status to other drugs, medicaments and biological substances status: Secondary | ICD-10-CM | POA: Diagnosis not present

## 2022-08-10 DIAGNOSIS — I10 Essential (primary) hypertension: Secondary | ICD-10-CM | POA: Diagnosis not present

## 2022-08-14 ENCOUNTER — Other Ambulatory Visit: Payer: Self-pay

## 2022-08-14 ENCOUNTER — Telehealth: Payer: Self-pay | Admitting: Adult Health

## 2022-08-14 MED ORDER — NOVOFINE AUTOCOVER PEN NEEDLE 30G X 8 MM MISC
1.0000 | 0 refills | Status: DC | PRN
Start: 2022-08-14 — End: 2022-08-28

## 2022-08-14 NOTE — Telephone Encounter (Signed)
Pen needles sent to pharmacy. Pt daughter advised of update.

## 2022-08-14 NOTE — Telephone Encounter (Signed)
Pt daughter stated that he was in the hospital and was put on inulin. Pt daughter stated that pt received new insulin bt needs the needles. Novofine 0.3 ml X 45m.

## 2022-08-14 NOTE — Telephone Encounter (Signed)
Pt's daughter called to FU :   1) Rx for insulin needles 2) wants to go over lantus + thyroid* 3) hospital f/u and blood sugar, sending him home with insulin

## 2022-08-16 ENCOUNTER — Encounter: Payer: Self-pay | Admitting: *Deleted

## 2022-08-16 ENCOUNTER — Ambulatory Visit (INDEPENDENT_AMBULATORY_CARE_PROVIDER_SITE_OTHER): Payer: Medicare HMO | Admitting: Adult Health

## 2022-08-16 ENCOUNTER — Telehealth: Payer: Self-pay | Admitting: *Deleted

## 2022-08-16 ENCOUNTER — Encounter: Payer: Self-pay | Admitting: Adult Health

## 2022-08-16 VITALS — BP 130/60 | HR 67 | Temp 97.6°F | Ht 67.0 in | Wt 177.0 lb

## 2022-08-16 DIAGNOSIS — E119 Type 2 diabetes mellitus without complications: Secondary | ICD-10-CM

## 2022-08-16 DIAGNOSIS — J0101 Acute recurrent maxillary sinusitis: Secondary | ICD-10-CM | POA: Diagnosis not present

## 2022-08-16 DIAGNOSIS — E0865 Diabetes mellitus due to underlying condition with hyperglycemia: Secondary | ICD-10-CM

## 2022-08-16 DIAGNOSIS — I10 Essential (primary) hypertension: Secondary | ICD-10-CM

## 2022-08-16 LAB — BASIC METABOLIC PANEL
BUN: 28 mg/dL — ABNORMAL HIGH (ref 6–23)
CO2: 27 mEq/L (ref 19–32)
Calcium: 9.7 mg/dL (ref 8.4–10.5)
Chloride: 99 mEq/L (ref 96–112)
Creatinine, Ser: 1.15 mg/dL (ref 0.40–1.50)
GFR: 57.16 mL/min — ABNORMAL LOW (ref >60.00)
Glucose, Bld: 156 mg/dL — ABNORMAL HIGH (ref 70–99)
Potassium: 4.2 mEq/L (ref 3.5–5.1)
Sodium: 133 mEq/L — ABNORMAL LOW (ref 135–145)

## 2022-08-16 LAB — POCT GLYCOSYLATED HEMOGLOBIN (HGB A1C): Hemoglobin A1C: 12.7 % — AB (ref 4.0–5.6)

## 2022-08-16 LAB — TSH: TSH: 1.98 u[IU]/mL (ref 0.35–5.50)

## 2022-08-16 MED ORDER — DOXYCYCLINE HYCLATE 100 MG PO TABS: 100.0000 mg | ORAL_TABLET | Freq: Two times a day (BID) | ORAL | 0 refills | Status: AC

## 2022-08-16 NOTE — Progress Notes (Signed)
Subjective:    Patient ID: Dakota Lewis, male    DOB: [birth year], 88 y.o.   MRN: [id number]  HPI 88 year old male who  has a past medical history of Bilateral pulmonary embolism ([place]), Chronic cough, DM (diabetes mellitus) ([place]), DVT (deep venous thrombosis) ([place]), Gastrointestinal bleed, H/O cardiovascular stress test (05/07/2011), H/O Doppler ultrasound (2013), H/O Doppler ultrasound (2012), H/O echocardiogram (03/14/2011), H/O exercise stress test (2006), Hiatal hernia, Hypertension, and Prostate cancer ([place]).  He presents to the office today for follow up after hospital admission as [facility]. He is with his daughter today   I do not  have most of the records from this hospital visit.   His daughter reports that last week he was checking his blood sugars and it was in the 400s for multiple days.  6 days ago he went to ER for fatigue and was admitted due to hyperglycemia.  He was discharged on Lantus 30 units daily and metformin 500 mg twice daily.  In the past his diabetes has been well controlled with just diet and exercise.  His last A1c in our office on October 18, 2021 ( 10 months ago) was 6.6 his baseline it is about 6.5.  Report eating a lot of junk food.  Her to going to the hospital he was experiencing fatigue, weight loss, polyuria and polydipsia.  All of this has resolved since being placed on insulin therapy and metformin.  \Home his blood sugars have been coming down, into the 170s currently.  Additionally his blood pressure medication was changed from losartan HCT 100-25 mg to losartan 100 mg and torsemide 5 mg daily.  Thankfully he is feeling better since his blood sugars have been under better control.  He has started to clean up his diet as well.  He is hoping that he can eventually get off of the insulin therapy and possibly metformin.  His daughter also reports that while in the hospital there was some concern for his thyroid.  She does not have lab values that  were taken in hospital with her.  Currently, he reports that over the last 2 weeks he has been experiencing sinus congestion, pain and pressure, postnasal drip, and a somewhat productive cough.  Review of Systems See HPI   Past Medical History:  Diagnosis Date   Bilateral pulmonary embolism (HCC)    lovenox & coumadin thearpy   Chronic cough    DM (diabetes mellitus) ([place])    DVT (deep venous thrombosis) (HCC)    left lower   Gastrointestinal bleed    prior   H/O cardiovascular stress test 05/07/2011   normal study, low risk scan   H/O Doppler ultrasound 2013   venous duplex doppler   H/O Doppler ultrasound 2012   venous duplex doppler   H/O echocardiogram 03/14/2011   EF 65-70%   H/O exercise stress test 2006   neg bruce protocol excercise stress test   Hiatal hernia    Hypertension    Prostate cancer [facility])    s/p radiation    Social History   Socioeconomic History   Marital status: Married    Spouse name: Not on file   Number of children: 3   Years of education: Not on file   Highest education level: Not on file  Occupational History   Not on file  Tobacco Use   Smoking status: Former    Packs/day: 0.75    Years: 20.00    Total pack years: 15.00  Types: Cigarettes    Passive exposure: Past   Smokeless tobacco: Never  Vaping Use   Vaping Use: Never used  Substance and Sexual Activity   Alcohol use: No   Drug use: No   Sexual activity: Not on file  Other Topics Concern   Not on file  Social History Narrative   Not on file   Social Determinants of Health   Financial Resource Strain: Low Risk  (12/04/2021)   Overall Financial Resource Strain (CARDIA)    Difficulty of Paying Living Expenses: Not hard at all  Food Insecurity: No Food Insecurity (12/04/2021)   Hunger Vital Sign    Worried About Running Out of Food in the Last Year: Never true    Ran Out of Food in the Last Year: Never true  Transportation Needs: No Transportation Needs (12/04/2021)    PRAPARE - Hydrologist (Medical): No    Lack of Transportation (Non-Medical): No  Physical Activity: Inactive (12/04/2021)   Exercise Vital Sign    Days of Exercise per Week: 0 days    Minutes of Exercise per Session: 0 min  Stress: No Stress Concern Present (12/04/2021)   [place]    Feeling of Stress : Not at all  Social Connections: Not on file  Intimate Partner Violence: Not on file    Past Surgical History:  Procedure Laterality Date   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   Lisinopril     cough    Current Outpatient Medications on File Prior to Visit  Medication Sig Dispense Refill   amLODipine (NORVASC) 10 MG tablet Take 1 tablet (10 mg total) by mouth daily. 90 tablet 1   apixaban (ELIQUIS) 5 MG TABS tablet Take 5 mg by mouth 2 (two) times daily.     Ascorbic Acid (VITAMIN C) 1000 MG tablet Take 1 tablet by mouth daily.     blood glucose meter kit and supplies KIT Dispense based on patient and insurance preference. Use up to four times daily as directed. 1 each 0   Blood Glucose Monitoring Suppl (ONE TOUCH ULTRA 2) w/Device KIT USE TO TEST BLOOD GLUCOSE TWICE DAILY 1 each 0   Carboxymethylcellulose Sod PF (THERATEARS PF) 0.25 % SOLN INSTILL 1 DROP IN BOTH EYES FOUR TIMES A DAY AS NEEDED      Cholecalciferol 50 MCG (2000 UT) CAPS 1 capsule Orally Once a day     clotrimazole-betamethasone (LOTRISONE) cream Apply 1 application. topically daily. Thin layer twice a day 45 g 2   fluticasone (FLONASE) 50 MCG/ACT nasal spray Place 2 sprays into both nostrils daily. 16 g 6   glucose blood test strip Use as instructed  300 each 3   Insulin Pen Needle (NOVOFINE AUTOCOVER PEN NEEDLE) 30G X 8 MM MISC Inject 10 each into the skin as needed. 90 each 0   Lancets 28G MISC 1 Stick by Does not apply route daily. 100 each 3   losartan-hydrochlorothiazide (HYZAAR) 100-25 MG tablet Take 1 tablet by mouth daily. 90 tablet 3   omeprazole (PRILOSEC) 20 MG capsule 1 capsule Orally twice a day     polyethylene glycol (MIRALAX / GLYCOLAX) 17 g packet TAKE 17 GRAMS BY MOUTH DAILY     potassium chloride (MICRO-K) 10 MEQ CR capsule Take 1 capsule (10 mEq total) by mouth daily. SCHEDULE OFFICE VISIT FOR FUTURE REFILLS 30 capsule 0   No current facility-administered medications on file prior to visit.    BP 130/60   Pulse 67   Temp 97.6 F (36.4 C) (Oral)   Ht 5' 7" (1.702 m)   Wt 177 lb (80.3 kg)   SpO2 94%   BMI 27.72 kg/m       Objective:   Physical Exam Vitals and nursing note reviewed.  Constitutional:      Appearance: Normal appearance.  HENT:     Nose: Congestion and rhinorrhea present. Rhinorrhea is purulent.     Right Turbinates: Enlarged and swollen.     Left Turbinates: Enlarged and swollen.     Right Sinus: Maxillary sinus tenderness present.     Left Sinus: Maxillary sinus tenderness present.  Cardiovascular:     Rate and Rhythm: Normal rate and regular rhythm.     Pulses: Normal pulses.     Heart sounds: Normal heart sounds.  Pulmonary:     Effort: Pulmonary effort is normal.     Breath sounds: Normal breath sounds.  Abdominal:     General: Abdomen is flat.     Palpations: Abdomen is soft.  Skin:    General: Skin is warm and dry.  Neurological:     General: No focal deficit  present.     Mental Status: He is alert and oriented to person, place, and time.  Psychiatric:        Mood and Affect: Mood normal.        Behavior: Behavior normal.        Thought Content: Thought content normal.        Judgment: Judgment normal.       Assessment & Plan:  1. Diabetes mellitus due to underlying condition, uncontrolled, with hyperglycemia ([place]) -A1c in the office today was 12.7, significantly higher than he has ever been in the past.  We will continue him on Lantus 30 units daily and metformin 500 mg twice daily.  Encouraged to clean up his diet and stay active.  He has a follow-up appointment scheduled for 1 month from now  -His daughter will try and get records from the portal that [facility] uses and send them to me. - POC HgB A1c- 12.7  - TSH; Future - Basic Metabolic Panel; Future - Basic Metabolic Panel - TSH  2. Acute recurrent maxillary sinusitis  - doxycycline (VIBRA-TABS) 100 MG tablet; Take 1 tablet (100 mg total) by mouth 2 (two) times daily for 7 days.  Dispense: 14 tablet; Refill: 0  3. Essential hypertension -We will keep him on Norvasc 10 mg daily, losartan 100 mg daily, and torsemide 5 mg daily.  [name], NP

## 2022-08-16 NOTE — Patient Outreach (Signed)
  Care Coordination [facility] Note Transition Care Management Follow-up Telephone Call Date of discharge and from where: Monday, 08/12/22 [facility]; hospitalization verified by [facility] tool, patient reports "blood sugar problems-- it was too high" per patient  How have you been since you were released from the hospital? "I am doing just fine now.  I saw my doctor this morning and he gave me a good report; my daughter is staying with me and she is keeping a close eye on me, but I am really able to do everything for myself.  I handle all of my medicines and even drive short distances.  I am not having any problems or concerns"  Any questions or concerns? No  Items Reviewed: Did the pt receive and understand the discharge instructions provided? Yes  Medications obtained and verified? Yes  Other? No  Any new allergies since your discharge? No  Dietary orders reviewed? Yes Do you have support at home? Yes  daughter assisting with ADL's and iADL's as needed/ indicated; patient reports he is independent in self-care  Home Care and Equipment/Supplies: Were home health services ordered? no If so, what is the name of the agency? N/A  Has the agency set up a time to come to the patient's home? not applicable Were any new equipment or medical supplies ordered?  No What is the name of the medical supply agency? N/A Were you able to get the supplies/equipment? not applicable Do you have any questions related to the use of the equipment or supplies? No N/A  Functional Questionnaire: (I = Independent and D = Dependent) ADLs: I  daughter assisting with ADL's and iADL's as needed  Bathing/Dressing- I  Meal Prep- I  daughter assisting with ADL's and iADL's as needed  Eating- I  Maintaining continence- I  Transferring/Ambulation- I  Managing Meds- I  Follow up appointments reviewed:  PCP Hospital f/u appt confirmed? Yes  Verified attended scheduled appointment with PCP 08/16/22  [facility]  f/u appt confirmed? No  Scheduled to see - on - @ - Are transportation arrangements needed? No  If their condition worsens, is the pt aware to call PCP or go to the Emergency Dept.? Yes Was the patient provided with contact information for the PCP's office or ED? Yes Was to pt encouraged to call back with questions or concerns? Yes  SDOH assessments and interventions completed:   Yes  Care Coordination Interventions Activated:  No   Care Coordination Interventions:  No Care Coordination interventions needed at this time.   Encounter Outcome:  Pt. Visit Completed    Oneta Rack, RN, BSN, CCRN Alumnus RN CM Care Coordination/ Transition of [place] Management [phone]: direct office

## 2022-08-23 ENCOUNTER — Other Ambulatory Visit: Payer: Self-pay | Admitting: Adult Health

## 2022-08-23 DIAGNOSIS — I10 Essential (primary) hypertension: Secondary | ICD-10-CM

## 2022-08-28 ENCOUNTER — Encounter: Payer: Self-pay | Admitting: Adult Health

## 2022-08-28 ENCOUNTER — Ambulatory Visit (INDEPENDENT_AMBULATORY_CARE_PROVIDER_SITE_OTHER): Payer: Medicare HMO | Admitting: Adult Health

## 2022-08-28 VITALS — BP 130/60 | HR 56 | Temp 97.6°F | Ht 66.25 in | Wt 177.0 lb

## 2022-08-28 DIAGNOSIS — K7460 Unspecified cirrhosis of liver: Secondary | ICD-10-CM

## 2022-08-28 DIAGNOSIS — C61 Malignant neoplasm of prostate: Secondary | ICD-10-CM | POA: Diagnosis not present

## 2022-08-28 DIAGNOSIS — E0865 Diabetes mellitus due to underlying condition with hyperglycemia: Secondary | ICD-10-CM | POA: Diagnosis not present

## 2022-08-28 DIAGNOSIS — I10 Essential (primary) hypertension: Secondary | ICD-10-CM

## 2022-08-28 DIAGNOSIS — E782 Mixed hyperlipidemia: Secondary | ICD-10-CM | POA: Diagnosis not present

## 2022-08-28 DIAGNOSIS — Z0000 Encounter for general adult medical examination without abnormal findings: Secondary | ICD-10-CM | POA: Diagnosis not present

## 2022-08-28 LAB — COMPREHENSIVE METABOLIC PANEL
ALT: 32 U/L (ref 0–53)
AST: 29 U/L (ref 0–37)
Albumin: 4 g/dL (ref 3.5–5.2)
Alkaline Phosphatase: 60 U/L (ref 39–117)
BUN: 19 mg/dL (ref 6–23)
CO2: 26 mEq/L (ref 19–32)
Calcium: 9.8 mg/dL (ref 8.4–10.5)
Chloride: 103 mEq/L (ref 96–112)
Creatinine, Ser: 0.87 mg/dL (ref 0.40–1.50)
GFR: 77.48 mL/min (ref >60.00)
Glucose, Bld: 133 mg/dL — ABNORMAL HIGH (ref 70–99)
Potassium: 4 mEq/L (ref 3.5–5.1)
Sodium: 136 mEq/L (ref 135–145)
Total Bilirubin: 0.6 mg/dL (ref 0.2–1.2)
Total Protein: 7.2 g/dL (ref 6.0–8.3)

## 2022-08-28 LAB — LIPID PANEL
Cholesterol: 145 mg/dL (ref 0–200)
HDL: 57.9 mg/dL (ref >39.00)
LDL Cholesterol: 56 mg/dL (ref 0–99)
NonHDL: 87.45
Total CHOL/HDL Ratio: 3
Triglycerides: 158 mg/dL — ABNORMAL HIGH (ref 0.0–149.0)
VLDL: 31.6 mg/dL (ref 0.0–40.0)

## 2022-08-28 LAB — CBC WITH DIFFERENTIAL/PLATELET
Basophils Absolute: 0.1 10*3/uL (ref 0.0–0.1)
Basophils Relative: 0.9 % (ref 0.0–3.0)
Eosinophils Absolute: 0.1 10*3/uL (ref 0.0–0.7)
Eosinophils Relative: 2.4 % (ref 0.0–5.0)
HCT: 39.1 % (ref 39.0–52.0)
Hemoglobin: 13.1 g/dL (ref 13.0–17.0)
Lymphocytes Relative: 42.7 % (ref 12.0–46.0)
Lymphs Abs: 2.5 10*3/uL (ref 0.7–4.0)
MCHC: 33.6 g/dL (ref 30.0–36.0)
MCV: 88.8 fl (ref 78.0–100.0)
Monocytes Absolute: 0.6 10*3/uL (ref 0.1–1.0)
Monocytes Relative: 9.2 % (ref 3.0–12.0)
Neutro Abs: 2.7 10*3/uL (ref 1.4–7.7)
Neutrophils Relative %: 44.8 % (ref 43.0–77.0)
Platelets: 213 10*3/uL (ref 150.0–400.0)
RBC: 4.4 Mil/uL (ref 4.22–5.81)
RDW: 13.1 % (ref 11.5–15.5)
WBC: 6 10*3/uL (ref 4.0–10.5)

## 2022-08-28 LAB — TSH: TSH: 0.44 u[IU]/mL (ref 0.35–5.50)

## 2022-08-28 MED ORDER — TORSEMIDE 5 MG PO TABS: 5.0000 mg | ORAL_TABLET | Freq: Every day | ORAL | 3 refills | Status: DC

## 2022-08-28 MED ORDER — AMLODIPINE BESYLATE 10 MG PO TABS: 10.0000 mg | ORAL_TABLET | Freq: Every day | ORAL | 3 refills | Status: DC

## 2022-08-28 MED ORDER — METFORMIN HCL ER 500 MG PO TB24: 500.0000 mg | ORAL_TABLET | Freq: Every day | ORAL | 0 refills | Status: DC

## 2022-08-28 MED ORDER — LOSARTAN POTASSIUM 100 MG PO TABS: 100.0000 mg | ORAL_TABLET | Freq: Every day | ORAL | 3 refills | Status: DC

## 2022-08-28 MED ORDER — POTASSIUM CHLORIDE ER 10 MEQ PO CPCR: 10.0000 meq | ORAL_CAPSULE | Freq: Every day | ORAL | 0 refills | Status: DC

## 2022-08-28 NOTE — Patient Instructions (Signed)
It was great seeing you today   We will follow up with you regarding your lab work   Please let me know if you need anything   I am going to change your Metformin to extended release so it does not upset your stomach. You can stop the insulin   Please follow up in 3 months for diabetic check and 1 year for physical exam

## 2022-08-28 NOTE — Progress Notes (Signed)
Subjective:    Patient ID: Dakota Lewis, male    DOB: 21-Aug-1933, 88 y.o.   MRN: [id number]  HPI Patient presents for yearly preventative medicine examination. He is a pleasant 88 year old male who  has a past medical history of Bilateral pulmonary embolism ([place]), Chronic cough, DM (diabetes mellitus) ([place]), DVT (deep venous thrombosis) ([place]), Gastrointestinal bleed, H/O cardiovascular stress test (05/07/2011), H/O Doppler ultrasound (2013), H/O Doppler ultrasound (2012), H/O echocardiogram (03/14/2011), H/O exercise stress test (2006), Hiatal hernia, Hypertension, and Prostate cancer ([place]).  DM Type 2 -diet controlled in the past but had a recent hospital admission due to hyperglycemia.  He was seen for follow-up 2 weeks ago his A1c was 12.7.  He was continued on Lantus 30 units daily and metformin 500 mg twice daily. Today he reports that he has had low blood sugar readings in the 80's The highest he has seen has been 160. He has not taken any medication in the last 4 days and his fasting blood sugar this morning was 137. He is on a low carb diet now. Also reports that Metformin has been causing GI distress. He has lost about 20 pounds since going on the low carb diet.  Lab Results  Component Value Date   HGBA1C 12.7 (A) 08/16/2022   Hypertension -managed with Norvasc 10 mg daily, torsemide 5 mg daily, and and losartan 100 mg daily.  He does monitor his blood pressure at home and reports readings in the 115's to 160s over 60s to 80s with most being in the 130s to 140s.  He denies dizziness, lightheadedness, chest pain, or shortness of breath BP Readings from Last 3 Encounters:  08/16/22 130/60  06/11/22 (!) 140/82  03/22/22 (!) 153/89   History of pulmonary embolism-takes Eliquis 5 mg twice daily.  He denies black tarry stools, nosebleeds, or coughing up blood  Hyperlipidemia-managed with omega-3 fatty acids twice daily Lab Results  Component Value Date   CHOL 158 07/12/2021   HDL 53.70  07/12/2021   LDLCALC 67 07/12/2021   LDLDIRECT 100.0 07/07/2019   TRIG 188.0 (H) 07/12/2021   CHOLHDL 3 07/12/2021   GERD-prescribed omeprazole 20 mg twice daily.  He feels as though this medication  Prostate cancer-underwent radiation therapy in 2009.  He is followed by urology on a yearly basis  NASH - followed by Gi in [place].   All immunizations and health maintenance protocols were reviewed with the patient and needed orders were placed.  Appropriate screening laboratory values were ordered for the patient including screening of hyperlipidemia, renal function and hepatic function.   Medication reconciliation,  past medical history, social history, problem list and allergies were reviewed in detail with the patient  Goals were established with regard to weight loss, exercise, and  diet in compliance with medications  Wt Readings from Last 3 Encounters:  08/28/22 177 lb (80.3 kg)  08/16/22 177 lb (80.3 kg)  06/11/22 196 lb (88.9 kg)    Review of Systems  Constitutional: Negative.   HENT: Negative.    Eyes: Negative.   Respiratory: Negative.    Cardiovascular: Negative.   Gastrointestinal:  Positive for diarrhea.  Endocrine: Negative.   Genitourinary: Negative.   Musculoskeletal: Negative.   Skin: Negative.   Allergic/Immunologic: Negative.   Neurological: Negative.   Hematological: Negative.   Psychiatric/Behavioral: Negative.    All other systems reviewed and are negative.  Past Medical History:  Diagnosis Date   Bilateral pulmonary embolism (HCC)    lovenox &  coumadin thearpy   Chronic cough    DM (diabetes mellitus) ([place])    DVT (deep venous thrombosis) (HCC)    left lower   Gastrointestinal bleed    prior   H/O cardiovascular stress test 05/07/2011   normal study, low risk scan   H/O Doppler ultrasound 2013   venous duplex doppler   H/O Doppler ultrasound 2012   venous duplex doppler   H/O echocardiogram 03/14/2011   EF 65-70%   H/O exercise stress  test 2006   neg bruce protocol excercise stress test   Hiatal hernia    Hypertension    Prostate cancer [facility])    s/p radiation    Social History   Socioeconomic History   Marital status: Married    Spouse name: Not on file   Number of children: 3   Years of education: Not on file   Highest education level: Not on file  Occupational History   Not on file  Tobacco Use   Smoking status: Former    Packs/day: 0.75    Years: 20.00    Total pack years: 15.00    Types: Cigarettes    Passive exposure: Past   Smokeless tobacco: Never  Vaping Use   Vaping Use: Never used  Substance and Sexual Activity   Alcohol use: No   Drug use: No   Sexual activity: Not on file  Other Topics Concern   Not on file  Social History Narrative   Not on file   Social Determinants of Health   Financial Resource Strain: Low Risk  (12/04/2021)   Overall Financial Resource Strain (CARDIA)    Difficulty of Paying Living Expenses: Not hard at all  Food Insecurity: No Food Insecurity (08/16/2022)   Hunger Vital Sign    Worried About Running Out of Food in the Last Year: Never true    Ran Out of Food in the Last Year: Never true  Transportation Needs: No Transportation Needs (08/16/2022)   PRAPARE - Hydrologist (Medical): No    Lack of Transportation (Non-Medical): No  Physical Activity: Inactive (12/04/2021)   Exercise Vital Sign    Days of Exercise per Week: 0 days    Minutes of Exercise per Session: 0 min  Stress: No Stress Concern Present (12/04/2021)   [place]    Feeling of Stress : Not at all  Social Connections: Not on file  Intimate Partner Violence: Not on file    Past Surgical History:  Procedure Laterality Date   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   Lisinopril     cough    Current Outpatient Medications on File Prior to Visit  Medication Sig Dispense Refill   amLODipine (NORVASC) 10 MG tablet Take 1 tablet (10 mg total) by mouth daily. 90 tablet 1   apixaban (ELIQUIS) 5 MG TABS tablet Take 5 mg by mouth 2 (two) times daily.     Ascorbic Acid (VITAMIN C) 1000 MG tablet Take 1 tablet by mouth daily.     blood glucose meter kit and supplies KIT Dispense based on patient and insurance preference. Use up to four times daily as directed. 1 each 0   Blood Glucose Monitoring Suppl (ONE TOUCH ULTRA 2) w/Device KIT USE TO TEST BLOOD GLUCOSE TWICE DAILY 1 each 0   Carboxymethylcellulose Sod PF (THERATEARS PF) 0.25 % SOLN INSTILL 1 DROP IN BOTH EYES FOUR TIMES A DAY AS NEEDED     Cholecalciferol 50 MCG (2000 UT) CAPS 1 capsule Orally Once a day     clotrimazole-betamethasone (LOTRISONE) cream Apply 1 application. topically daily. Thin layer twice a day 45 g 2   fluticasone (FLONASE) 50 MCG/ACT nasal spray Place 2 sprays into both nostrils daily. 16 g 6   glucose blood test strip Use as instructed 300 each 3   insulin glargine (LANTUS) 100 UNIT/ML injection Inject 30 Units into the skin daily.     Insulin Pen Needle (NOVOFINE AUTOCOVER PEN NEEDLE) 30G X 8 MM MISC Inject 10 each into the skin as needed. 90 each 0   Lancets 28G MISC 1 Stick by Does not apply route daily. 100 each 3   losartan (COZAAR) 100 MG tablet Take 100 mg by mouth daily.     metFORMIN (GLUCOPHAGE) 500 MG tablet Take 500  mg by mouth 2 (two) times daily with a meal.     omeprazole (PRILOSEC) 20 MG capsule 1 capsule Orally twice a day     polyethylene glycol (MIRALAX / GLYCOLAX) 17 g packet TAKE 17 GRAMS BY MOUTH DAILY     potassium chloride (MICRO-K) 10 MEQ CR capsule Take 1 capsule (10 mEq total) by mouth daily. SCHEDULE OFFICE VISIT FOR FUTURE REFILLS 30 capsule 0   torsemide (DEMADEX) 5 MG tablet Take 5 mg by mouth daily.     No current facility-administered medications on file prior to visit.    There were no vitals taken for this visit.      Objective:   Physical Exam Vitals and nursing note reviewed.  Constitutional:      General: He is not in acute distress.    Appearance: Normal appearance. He is well-developed and normal weight.  HENT:     Head: Normocephalic and atraumatic.     Right Ear: Tympanic membrane, ear canal and external ear normal. There is no impacted cerumen.     Left Ear: Tympanic membrane, ear canal and external ear normal. There is no impacted cerumen.     Nose: Nose normal. No congestion or rhinorrhea.     Mouth/Throat:     Mouth: Mucous membranes are moist.     Pharynx: Oropharynx is clear. No oropharyngeal exudate or posterior oropharyngeal erythema.  Eyes:     General:        Right eye: No discharge.        Left eye: No discharge.     Extraocular Movements: Extraocular movements intact.     Conjunctiva/sclera: Conjunctivae normal.     Pupils: Pupils are equal, round, and reactive to light.  Neck:     Vascular: No carotid bruit.     Trachea: No tracheal deviation.  Cardiovascular:     Rate  and Rhythm: Normal rate and regular rhythm.     Pulses: Normal pulses.     Heart sounds: Normal heart sounds. No murmur heard.    No friction rub. No gallop.  Pulmonary:     Effort: Pulmonary effort is normal. No respiratory distress.     Breath sounds: Normal breath sounds. No stridor. No wheezing, rhonchi or rales.  Chest:     Chest wall: No tenderness.  Abdominal:      General: Bowel sounds are normal. There is no distension.     Palpations: Abdomen is soft. There is no mass.     Tenderness: There is no abdominal tenderness. There is no right CVA tenderness, left CVA tenderness, guarding or rebound.     Hernia: No hernia is present.  Musculoskeletal:        General: No swelling, tenderness, deformity or signs of injury. Normal range of motion.     Right lower leg: No edema.     Left lower leg: No edema.  Lymphadenopathy:     Cervical: No cervical adenopathy.  Skin:    General: Skin is warm and dry.     Capillary Refill: Capillary refill takes less than 2 seconds.     Coloration: Skin is not jaundiced or pale.     Findings: No bruising, erythema, lesion or rash.  Neurological:     General: No focal deficit present.     Mental Status: He is alert and oriented to person, place, and time.     Cranial Nerves: No cranial nerve deficit.     Sensory: No sensory deficit.     Motor: No weakness.     Coordination: Coordination normal.     Gait: Gait normal.     Deep Tendon Reflexes: Reflexes normal.  Psychiatric:        Mood and Affect: Mood normal.        Behavior: Behavior normal.        Thought Content: Thought content normal.        Judgment: Judgment normal.       Assessment & Plan:  1. Routine general medical examination at a health care facility - Continue to eat healthy and exercise - Follow up in one year or sooner if needed - CBC with Differential/Platelet; Future - Comprehensive metabolic panel; Future - Lipid panel; Future - TSH; Future  2. Diabetes mellitus due to underlying condition, uncontrolled, with hyperglycemia ([place]) - Doing much better since he changed his diet. Will d/c lantus. Will switch Metformin to ER  - Follow up in three months  - CBC with Differential/Platelet; Future - Comprehensive metabolic panel; Future - Lipid panel; Future - TSH; Future - metFORMIN (GLUCOPHAGE-XR) 500 MG 24 hr tablet; Take 1 tablet (500 mg  total) by mouth daily with breakfast.  Dispense: 90 tablet; Refill: 0  3. Essential hypertension - Well controlled. No change in medication  - CBC with Differential/Platelet; Future - Comprehensive metabolic panel; Future - Lipid panel; Future - TSH; Future - amLODipine (NORVASC) 10 MG tablet; Take 1 tablet (10 mg total) by mouth daily.  Dispense: 90 tablet; Refill: 3 - losartan (COZAAR) 100 MG tablet; Take 1 tablet (100 mg total) by mouth daily.  Dispense: 90 tablet; Refill: 3 - potassium chloride (MICRO-K) 10 MEQ CR capsule; Take 1 capsule (10 mEq total) by mouth daily. SCHEDULE OFFICE VISIT FOR FUTURE REFILLS  Dispense: 30 capsule; Refill: 0 - torsemide (DEMADEX) 5 MG tablet; Take 1 tablet (5 mg total) by mouth daily.  Dispense: 90 tablet; Refill: 3  4. Mixed hyperlipidemia - Consider statin  - CBC with Differential/Platelet; Future - Comprehensive metabolic panel; Future - Lipid panel; Future - TSH; Future  5. Cirrhosis of liver without ascites, unspecified hepatic cirrhosis type ([place]) - Per GI  - CBC with Differential/Platelet; Future - Comprehensive metabolic panel; Future - Lipid panel; Future - TSH; Future  6. Prostate cancer [facility]) - Per Urology   [name], NP

## 2022-09-05 ENCOUNTER — Telehealth: Payer: Self-pay | Admitting: Adult Health

## 2022-09-05 NOTE — Telephone Encounter (Signed)
Left message to return phone call.

## 2022-09-05 NOTE — Telephone Encounter (Signed)
Pt's daughter called to say they are having an issue with Pt's Metformin.   Hospital gave him 30 day supply, but NP gives him "Time Release Metformin"  Daughter is asking for a call back, to clarify   [place], [place] Phone: [phone]  Fax: [phone]

## 2022-09-06 NOTE — Telephone Encounter (Signed)
Spoke to pt daughter [name] and she stated that the issue was the pharmacy was not going to fill pt new Rx of Metformin. [name] stated that they advise insurance would not cover it until 09/09/2022 since he just had one filled from hospital. Pt has not taking any insulin or Metformin since then. [name] wants to know if pt will be ok to stay off of Rx until 09/09/2022.

## 2022-09-06 NOTE — Telephone Encounter (Signed)
Left detailed message informing  of update.

## 2022-09-06 NOTE — Telephone Encounter (Signed)
Spoke to the pharmacist and they stated the Metformin wasn't able to be filled due to insurance. According to the pharmacist the only thing that changed was pt taking it twice a day to once a day. Hte pharmacist will be able to fill this tomorrow.  I left a detailed message informing Dakota Lewis of update.

## 2022-09-09 ENCOUNTER — Telehealth: Payer: Self-pay | Admitting: Pharmacist

## 2022-09-09 NOTE — Chronic Care Management (AMB) (Signed)
    Chronic Care Management Pharmacy Assistant   Name: Dakota Lewis  MRN: [id number] DOB: 07-31-1933  Reason for Encounter: Follow up recent blood sugars.  Patients A1C recently elevated from 6.6 to 12.7 and was seen for hyperglycemia.   Has patient been checking blood sugars? Patient has been checking blood sugars three times daily before each meal.  Patient states he is currently taking Metformin and no longer taking Lantus, patient is also following a low carbohydrate diet, he is tracking his carbs and is eating no more than 45 carbs per meal.  Patient states he has been checking blood sugars for quite a while, he recently gave his blood sugar recordings to the [place] at that appointment and they did not give it back to him.   Patients current blood sugar readings:  09/06/22   fasting 147  -  before lunch 149  -  before dinner - 116 09/07/22   fasting 145  -  before lunch 142  -  before dinner - 140 09/08/22   fasting 143  -  before lunch 152  -  before dinner - 141 09/09/22   fasting 182  Care Gaps: AWV - completed 12/04/2021 Last BP - 130/60 on 08/28/2022 Last A1C - 12/7 on 08/16/2022 Eye exam - overdue Foot exam - overdue Covid - postponed   Star Rating Drugs: Losartan 100 mg - last filled 08/28/2022 90 DS at [facility] Metformin 500 mg - last filled 09/07/2022 30 DS at [facility] [phone]

## 2022-09-11 DIAGNOSIS — E1165 Type 2 diabetes mellitus with hyperglycemia: Secondary | ICD-10-CM | POA: Diagnosis not present

## 2022-09-17 ENCOUNTER — Telehealth: Payer: Self-pay | Admitting: Adult Health

## 2022-09-17 NOTE — Telephone Encounter (Signed)
Patient notified of update  and verbalized understanding.

## 2022-09-17 NOTE — Telephone Encounter (Signed)
Please advise 

## 2022-09-17 NOTE — Telephone Encounter (Signed)
Pt daughter nancy is calling and since starting the metformin he has been having diarrhea and nausea and has throw up. Pt last seen on 08-28-2022. Pt daughter would like a callback and does not want to make another appt

## 2022-09-18 ENCOUNTER — Encounter: Payer: Self-pay | Admitting: Family Medicine

## 2022-09-18 ENCOUNTER — Ambulatory Visit (INDEPENDENT_AMBULATORY_CARE_PROVIDER_SITE_OTHER): Payer: Medicare HMO | Admitting: Family Medicine

## 2022-09-18 VITALS — BP 154/66 | HR 60 | Temp 98.1°F | Ht 66.25 in | Wt 175.8 lb

## 2022-09-18 DIAGNOSIS — E1165 Type 2 diabetes mellitus with hyperglycemia: Secondary | ICD-10-CM

## 2022-09-18 DIAGNOSIS — R197 Diarrhea, unspecified: Secondary | ICD-10-CM

## 2022-09-18 DIAGNOSIS — R110 Nausea: Secondary | ICD-10-CM

## 2022-09-18 DIAGNOSIS — R1013 Epigastric pain: Secondary | ICD-10-CM | POA: Diagnosis not present

## 2022-09-18 MED ORDER — ONDANSETRON HCL 4 MG PO TABS: 4.0000 mg | ORAL_TABLET | Freq: Three times a day (TID) | ORAL | 0 refills | Status: DC | PRN

## 2022-09-18 NOTE — Patient Instructions (Signed)
Start back the Lantus at 10 units once daily  HOLD the Metformin  Monitor blood sugars closely.

## 2022-09-18 NOTE — Progress Notes (Signed)
Established Patient Office Visit  Subjective   Patient ID: Dakota Lewis, male    DOB: 12/29/1932  Age: 88 y.o. MRN: [id number]  Chief Complaint  Patient presents with   Nausea    Patient complains of nausea, x3 weeks   Diarrhea    Patient complains of diarrhea, x3 weeks    Abdominal Pain    Patient complains of abdominal pain, x3 weeks   Emesis    HPI   [name] is seen accompanied by his daughter with complaints that he had some intermittent diarrhea and nausea without vomiting as well as some intermittent epigastric pain for the past few weeks.  His chronic problems include history of hypertension, past history of pulmonary embolism, type 2 diabetes, prostate cancer.  Recent history is that he was apparently admitted to [facility] with glucose over 400 back around November 18.  He was placed on metformin twice daily but had some significant diarrhea.  He was then reduced to metformin extended release once daily but has continued to have some intermittent diarrhea and nausea symptoms.  2 days ago he stopped the metformin completely.  He apparently had A1c which climbed to 12.7 back in November 10.  At 1 point he was taking Lantus 30 units daily with metformin and developed some hypoglycemia.  His Lantus was then reduced to 15 units once daily before being discontinued altogether.  He just stopped the metformin day before yesterday.  Still having 7-8 loose to watery stools per day.  No bloody stools.  No fever.  No recent antibiotics.  He also complains of some intermittent epigastric pain.  No history of pancreatitis.  Denies any fever or chills.  No dysuria.  They have been checking his blood sugars 2-3 times daily.  These usually range around 130-150 but this was when he was taken the metformin.  Currently not on any insulin.  He apparently has lost some weight in recent weeks.  Past Medical History:  Diagnosis Date   Bilateral pulmonary embolism (HCC)    lovenox & coumadin  thearpy   Chronic cough    DM (diabetes mellitus) ([place])    DVT (deep venous thrombosis) (HCC)    left lower   Gastrointestinal bleed    prior   H/O cardiovascular stress test 05/07/2011   normal study, low risk scan   H/O Doppler ultrasound 2013   venous duplex doppler   H/O Doppler ultrasound 2012   venous duplex doppler   H/O echocardiogram 03/14/2011   EF 65-70%   H/O exercise stress test 2006   neg Lovene Maret protocol excercise stress test   Hiatal hernia    Hypertension    Prostate cancer [facility])    s/p radiation   Past Surgical History:  Procedure Laterality Date   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 His smoking use included cigarettes. He has a 15.00 pack-year smoking history. He has been exposed to tobacco smoke. He has never used smokeless tobacco. He reports that he does not drink alcohol and does not use drugs. family history includes  Arthritis in his brother; Heart disease in his brother and brother; Kidney disease in his father; Prostate cancer in his brother; Stroke in his mother. Allergies  Allergen Reactions   Lidocaine     ANTI ITCH CREAM, NO SPECIFICATIONS IN RECORDS.   Lisinopril     cough    Review of Systems  Constitutional:  Positive for weight loss. Negative for chills and fever.  Respiratory:  Negative for shortness of breath.   Cardiovascular:  Negative for chest pain.  Gastrointestinal:  Positive for abdominal pain, diarrhea and nausea. Negative for blood in stool, melena and vomiting.  Genitourinary:  Negative for dysuria.      Objective:     BP (!) 154/66 (BP Location: Left Arm, Patient Position: Sitting, Cuff Size:  Normal)   Pulse 60   Temp 98.1 F (36.7 C) (Oral)   Ht 5' 6.25" (1.683 m)   Wt 175 lb 12.8 oz (79.7 kg)   SpO2 98%   BMI 28.16 kg/m  BP Readings from Last 3 Encounters:  09/18/22 (!) 154/66  08/28/22 130/60  08/16/22 130/60   Wt Readings from Last 3 Encounters:  09/18/22 175 lb 12.8 oz (79.7 kg)  08/28/22 177 lb (80.3 kg)  08/16/22 177 lb (80.3 kg)      Physical Exam Vitals reviewed.  Constitutional:      Appearance: He is well-developed.  Cardiovascular:     Rate and Rhythm: Normal rate and regular rhythm.  Pulmonary:     Effort: Pulmonary effort is normal.     Breath sounds: Normal breath sounds.  Abdominal:     Comments: Normal bowel sounds.  Nondistended.  Soft with mild epigastric tenderness.  No guarding or rebound.  No right upper quadrant tenderness.  Neurological:     Mental Status: He is alert.      No results found for any visits on 09/18/22.    The ASCVD Risk score (Arnett DK, et al., 2019) failed to calculate for the following reasons:   The 2019 ASCVD risk score is only valid for ages 38 to 28    Assessment & Plan:   88 year old gentleman with recent significant hyperglycemia started on metformin and Lantus with several week history of diarrhea, nausea without vomiting, and intermittent epigastric pain.  Patient stopped metformin a couple days ago as above.  Has had poor appetite and reported weight loss as above.  Questions whether his weight loss was related to poorly controlled diabetes versus other.  -Hold metformin for now -Start back Lantus 10 units once daily.  We expressed our concern about not starting back anything since he became significantly hyperglycemic back in November. -Focus on good adequate hydration -Continue monitoring blood sugars regularly -Send in Zofran 4 mg every 8 hours as needed for nausea/vomiting -Check labs with CBC, CMP, lipase -If diarrhea and abdominal symptoms not resolving in the next couple days consider  imaging with ultrasound or CT abdomen pelvis -We recommended setting up follow-up with primary in 1 week to reassess -Consider further lab evaluation to evaluate diarrhea if not resolving in the next week although there were no reported antibiotics to suggest significant risk for C. difficile   Return in about 1 week (around 09/25/2022).    Carolann Littler, MD

## 2022-09-19 ENCOUNTER — Other Ambulatory Visit: Payer: Self-pay | Admitting: Nurse Practitioner

## 2022-09-19 DIAGNOSIS — K838 Other specified diseases of biliary tract: Secondary | ICD-10-CM

## 2022-09-19 DIAGNOSIS — K7460 Unspecified cirrhosis of liver: Secondary | ICD-10-CM

## 2022-09-19 DIAGNOSIS — K7581 Nonalcoholic steatohepatitis (NASH): Secondary | ICD-10-CM | POA: Diagnosis not present

## 2022-09-19 LAB — CBC WITH DIFFERENTIAL/PLATELET
Basophils Absolute: 0.1 10*3/uL (ref 0.0–0.1)
Basophils Relative: 1.3 % (ref 0.0–3.0)
Eosinophils Absolute: 0.1 10*3/uL (ref 0.0–0.7)
Eosinophils Relative: 1.3 % (ref 0.0–5.0)
HCT: 43.7 % (ref 39.0–52.0)
Hemoglobin: 15 g/dL (ref 13.0–17.0)
Lymphocytes Relative: 38.3 % (ref 12.0–46.0)
Lymphs Abs: 3 10*3/uL (ref 0.7–4.0)
MCHC: 34.3 g/dL (ref 30.0–36.0)
MCV: 88 fl (ref 78.0–100.0)
Monocytes Absolute: 0.7 10*3/uL (ref 0.1–1.0)
Monocytes Relative: 9 % (ref 3.0–12.0)
Neutro Abs: 3.9 10*3/uL (ref 1.4–7.7)
Neutrophils Relative %: 50.1 % (ref 43.0–77.0)
Platelets: 317 10*3/uL (ref 150.0–400.0)
RBC: 4.97 Mil/uL (ref 4.22–5.81)
RDW: 13.5 % (ref 11.5–15.5)
WBC: 7.8 10*3/uL (ref 4.0–10.5)

## 2022-09-19 LAB — COMPREHENSIVE METABOLIC PANEL
ALT: 43 U/L (ref 0–53)
AST: 32 U/L (ref 0–37)
Albumin: 4.4 g/dL (ref 3.5–5.2)
Alkaline Phosphatase: 69 U/L (ref 39–117)
BUN: 28 mg/dL — ABNORMAL HIGH (ref 6–23)
CO2: 24 mEq/L (ref 19–32)
Calcium: 10.1 mg/dL (ref 8.4–10.5)
Chloride: 103 mEq/L (ref 96–112)
Creatinine, Ser: 1.1 mg/dL (ref 0.40–1.50)
GFR: 60.25 mL/min (ref >60.00)
Glucose, Bld: 139 mg/dL — ABNORMAL HIGH (ref 70–99)
Potassium: 4.7 mEq/L (ref 3.5–5.1)
Sodium: 134 mEq/L — ABNORMAL LOW (ref 135–145)
Total Bilirubin: 0.8 mg/dL (ref 0.2–1.2)
Total Protein: 7.9 g/dL (ref 6.0–8.3)

## 2022-09-19 LAB — LIPASE: Lipase: 11 U/L (ref 11.0–59.0)

## 2022-09-25 ENCOUNTER — Ambulatory Visit (INDEPENDENT_AMBULATORY_CARE_PROVIDER_SITE_OTHER): Payer: Medicare HMO | Admitting: Adult Health

## 2022-09-25 ENCOUNTER — Encounter: Payer: Self-pay | Admitting: Adult Health

## 2022-09-25 VITALS — BP 130/64 | HR 60 | Temp 97.5°F | Ht 66.5 in | Wt 173.0 lb

## 2022-09-25 DIAGNOSIS — R197 Diarrhea, unspecified: Secondary | ICD-10-CM

## 2022-09-25 DIAGNOSIS — R1013 Epigastric pain: Secondary | ICD-10-CM | POA: Diagnosis not present

## 2022-09-25 DIAGNOSIS — E1165 Type 2 diabetes mellitus with hyperglycemia: Secondary | ICD-10-CM | POA: Diagnosis not present

## 2022-09-25 NOTE — Progress Notes (Signed)
Subjective:    Patient ID: Dakota Lewis, male    DOB: [birth year], 88 y.o.   MRN: [id number]  HPI 88 year old male who  has a past medical history of Bilateral pulmonary embolism ([place]), Chronic cough, DM (diabetes mellitus) ([place]), DVT (deep venous thrombosis) ([place]), Gastrointestinal bleed, H/O cardiovascular stress test (05/07/2011), H/O Doppler ultrasound (2013), H/O Doppler ultrasound (2012), H/O echocardiogram (03/14/2011), H/O exercise stress test (2006), Hiatal hernia, Hypertension, and Prostate cancer ([place]).  He presents to the office today for 1 week follow-up after being seen by another provider in the office for abdominal pain and loose watery stools.  Does have a history of diabetes and a few months ago had an A1c which climbed to 12.7.  At 1 point he was taking 30 units of Lantus daily with metformin and developed some hypoglycemia.  His Lantus was then reduced to 15 units once daily before being discontinued altogether.  He had made significant dietary changes with a low-carb low sugar diet.  We switched his metformin to extended release due to diarrhea and epigastric pain but despite being on the extended release his symptoms continued.  He was then advised to come off of his symptoms resolved.  When he was seen in the office a week ago for his symptoms were still present but he had only been off metformin for 2-3 days.  He was placed back on Lantus at 10 units daily and advised to hold the metformin until he can be further evaluated.  Today on follow up he reports that he is no longer having any abdominal pain or diarrhea.   His blood sugars with the lantus has been in the 120-140 with no hypoglycemic episodes.     Review of Systems See HPI   Past Medical History:  Diagnosis Date   Bilateral pulmonary embolism (HCC)    lovenox & coumadin thearpy   Chronic cough    DM (diabetes mellitus) ([place])    DVT (deep venous thrombosis) (HCC)    left lower   Gastrointestinal bleed    prior    H/O cardiovascular stress test 05/07/2011   normal study, low risk scan   H/O Doppler ultrasound 2013   venous duplex doppler   H/O Doppler ultrasound 2012   venous duplex doppler   H/O echocardiogram 03/14/2011   EF 65-70%   H/O exercise stress test 2006   neg bruce protocol excercise stress test   Hiatal hernia    Hypertension    Prostate cancer [facility])    s/p radiation    Social History   Socioeconomic History   Marital status: Married    Spouse name: Not on file   Number of children: 3   Years of education: Not on file   Highest education level: Not on file  Occupational History   Not on file  Tobacco Use   Smoking status: Former    Packs/day: 0.75    Years: 20.00    Total pack years: 15.00    Types: Cigarettes    Passive exposure: Past   Smokeless tobacco: Never  Vaping Use   Vaping Use: Never used  Substance and Sexual Activity   Alcohol use: No   Drug use: No   Sexual activity: Not on file  Other Topics Concern   Not on file  Social History Narrative   Not on file   Social Determinants of Health   Financial Resource Strain: Low Risk  (12/04/2021)   Overall Financial Resource Strain (CARDIA)  Difficulty of Paying Living Expenses: Not hard at all  Food Insecurity: No Food Insecurity (08/16/2022)   Hunger Vital Sign    Worried About Running Out of Food in the Last Year: Never true    Ran Out of Food in the Last Year: Never true  Transportation Needs: No Transportation Needs (08/16/2022)   PRAPARE - Hydrologist (Medical): No    Lack of Transportation (Non-Medical): No  Physical Activity: Inactive (12/04/2021)   Exercise Vital Sign    Days of Exercise per Week: 0 days    Minutes of Exercise per Session: 0 min  Stress: No Stress Concern Present (12/04/2021)   [place]    Feeling of Stress : Not at all  Social Connections: Not on file  Intimate Partner  Violence: Not on file    Past Surgical History:  Procedure Laterality Date   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   Lisinopril     cough    Current Outpatient Medications on File Prior to Visit  Medication Sig Dispense Refill   amLODipine (NORVASC) 10 MG tablet Take 1 tablet (10 mg total) by mouth daily. 90 tablet 3   apixaban (ELIQUIS) 5 MG TABS tablet Take 5 mg by mouth 2 (two) times daily.     Ascorbic Acid (VITAMIN C) 1000 MG tablet Take 1 tablet by mouth daily.     blood glucose meter kit and supplies KIT Dispense based on patient and insurance preference. Use up to four times daily as directed. 1 each 0   Blood Glucose Monitoring Suppl (ONE TOUCH ULTRA 2) w/Device KIT USE TO TEST BLOOD GLUCOSE TWICE DAILY 1 each 0   Carboxymethylcellulose Sod PF (THERATEARS PF) 0.25 % SOLN INSTILL 1 DROP IN BOTH EYES FOUR TIMES A DAY AS NEEDED     Cholecalciferol 50 MCG (2000 UT) CAPS 1 capsule Orally Once a day     clotrimazole-betamethasone (LOTRISONE) cream Apply 1 application. topically daily. Thin layer twice a day 45 g 2   glucose blood test strip Use as instructed 300 each 3   Lancets 28G MISC 1 Stick by Does not apply route daily. 100 each 3   losartan (COZAAR) 100 MG tablet Take 1 tablet (100 mg total) by mouth daily. 90 tablet 3   omeprazole (PRILOSEC) 20 MG capsule 1 capsule Orally twice a day      ondansetron (ZOFRAN) 4 MG tablet Take 1 tablet (4 mg total) by mouth every 8 (eight) hours as needed for nausea or vomiting. 20 tablet 0   polyethylene glycol (MIRALAX / GLYCOLAX) 17 g packet TAKE 17 GRAMS BY MOUTH DAILY     potassium chloride (MICRO-K) 10  MEQ CR capsule Take 1 capsule (10 mEq total) by mouth daily. SCHEDULE OFFICE VISIT FOR FUTURE REFILLS 30 capsule 0   torsemide (DEMADEX) 5 MG tablet Take 1 tablet (5 mg total) by mouth daily. 90 tablet 3   No current facility-administered medications on file prior to visit.    There were no vitals taken for this visit.      Objective:   Physical Exam Vitals and nursing note reviewed.  Constitutional:      Appearance: Normal appearance.  Cardiovascular:     Rate and Rhythm: Normal rate and regular rhythm.     Pulses: Normal pulses.     Heart sounds: Normal heart sounds.  Pulmonary:     Effort: Pulmonary effort is normal.     Breath sounds: Normal breath sounds.  Musculoskeletal:        General: Normal range of motion.  Skin:    General: Skin is warm and dry.  Neurological:     General: No focal deficit present.     Mental Status: He is alert and oriented to person, place, and time.  Psychiatric:        Mood and Affect: Mood normal.        Behavior: Behavior normal.        Thought Content: Thought content normal.        Judgment: Judgment normal.        Assessment & Plan:  1. Abdominal pain, epigastric - resolved  2. Diarrhea, unspecified type - resolved  3. Type 2 diabetes mellitus with hyperglycemia, unspecified whether long term insulin use ([place]) - Controlled with 10 units lantus daily  - Follow up Feb 2024 for repeat A1c  [name], NP  Time spent with patient today was 31 minutes which consisted of chart review, discussing DM,  work up, treatment answering questions and documentation.

## 2022-10-14 ENCOUNTER — Ambulatory Visit
Admission: RE | Admit: 2022-10-14 | Discharge: 2022-10-14 | Disposition: A | Discharge status: Home / Self Care | Payer: Medicare PPO | Source: Ambulatory Visit | Attending: Nurse Practitioner | Admitting: Nurse Practitioner

## 2022-10-14 DIAGNOSIS — K824 Cholesterolosis of gallbladder: Secondary | ICD-10-CM | POA: Diagnosis not present

## 2022-10-14 DIAGNOSIS — K838 Other specified diseases of biliary tract: Secondary | ICD-10-CM

## 2022-10-14 DIAGNOSIS — K7460 Unspecified cirrhosis of liver: Secondary | ICD-10-CM

## 2022-10-22 ENCOUNTER — Other Ambulatory Visit: Payer: Self-pay | Admitting: Adult Health

## 2022-10-22 DIAGNOSIS — I10 Essential (primary) hypertension: Secondary | ICD-10-CM

## 2022-10-29 ENCOUNTER — Telehealth: Payer: Medicare HMO

## 2022-11-08 ENCOUNTER — Telehealth: Payer: Self-pay | Admitting: Adult Health

## 2022-11-08 MED ORDER — LANTUS SOLOSTAR 100 UNIT/ML ~~LOC~~ SOPN: 10.0000 [IU] | PEN_INJECTOR | Freq: Every day | SUBCUTANEOUS | 0 refills | Status: DC

## 2022-11-08 NOTE — Telephone Encounter (Signed)
Rx refilled for 10 untis daily for 1 month. Pt will f/u on Feb. 20th for DM.

## 2022-11-08 NOTE — Telephone Encounter (Signed)
Prescription Request    Is this a "Controlled Substance" medicine? No  LOV: 09/25/2022  What is the name of the medication or equipment? LANTUS SOLOSTAR 100 UNIT/ML Solostar Pen   Have you contacted your pharmacy to request a refill? Yes   Which pharmacy would you like this sent to?   [address] [place], [place] Phone: [phone]  Fax: [phone]      Patient notified that their request is being sent to the clinical staff for review and that they should receive a response within 2 business days.   Please advise at Mobile [phone] (mobile)

## 2022-11-09 ENCOUNTER — Other Ambulatory Visit: Payer: Self-pay | Admitting: Adult Health

## 2022-11-21 ENCOUNTER — Other Ambulatory Visit: Payer: Self-pay | Admitting: Adult Health

## 2022-11-21 DIAGNOSIS — I10 Essential (primary) hypertension: Secondary | ICD-10-CM

## 2022-11-22 ENCOUNTER — Ambulatory Visit: Payer: Medicare HMO | Admitting: Adult Health

## 2022-11-26 ENCOUNTER — Encounter: Payer: Self-pay | Admitting: Adult Health

## 2022-11-26 ENCOUNTER — Ambulatory Visit (INDEPENDENT_AMBULATORY_CARE_PROVIDER_SITE_OTHER): Payer: Medicare PPO | Admitting: Adult Health

## 2022-11-26 VITALS — BP 134/84 | HR 56 | Temp 97.5°F | Ht 66.0 in | Wt 181.0 lb

## 2022-11-26 DIAGNOSIS — E1165 Type 2 diabetes mellitus with hyperglycemia: Secondary | ICD-10-CM | POA: Diagnosis not present

## 2022-11-26 DIAGNOSIS — I10 Essential (primary) hypertension: Secondary | ICD-10-CM

## 2022-11-26 LAB — POCT GLYCOSYLATED HEMOGLOBIN (HGB A1C): Hemoglobin A1C: 6.2 % — AB (ref 4.0–5.6)

## 2022-11-26 MED ORDER — PEN NEEDLES 31G X 6 MM MISC: 1.0000 | Freq: Every day | 0 refills | Status: DC

## 2022-11-26 NOTE — Progress Notes (Signed)
Subjective:    Patient ID: Dakota Lewis, male    DOB: Apr 12, 1933, 89 y.o.   MRN: [id number]  HPI 89 year old male who  has a past medical history of Bilateral pulmonary embolism ([place]), Chronic cough, DM (diabetes mellitus) ([place]), DVT (deep venous thrombosis) ([place]), Gastrointestinal bleed, H/O cardiovascular stress test (05/07/2011), H/O Doppler ultrasound (2013), H/O Doppler ultrasound (2012), H/O echocardiogram (03/14/2011), H/O exercise stress test (2006), Hiatal hernia, Hypertension, and Prostate cancer ([place]).  He presents to the office today for follow up regarding DM and HTN   DM type 2 - managed with lantus 10 units daily. He does monitor his blood sugar at home with readings in the 90's - 150's with most being in the 120-140's. He denies hypoglycemic episodes. He continues to try and eat healthy and stays away from carbs and sugars.   HTN - managed with losartan 100 mg daily. He denies myalgia or fatigue.  BP Readings from Last 3 Encounters:  11/26/22 134/84  09/25/22 130/64  09/18/22 (!) 154/66    Review of Systems  See HPI   Past Medical History:  Diagnosis Date   Bilateral pulmonary embolism (HCC)    lovenox & coumadin thearpy   Chronic cough    DM (diabetes mellitus) ([place])    DVT (deep venous thrombosis) (HCC)    left lower   Gastrointestinal bleed    prior   H/O cardiovascular stress test 05/07/2011   normal study, low risk scan   H/O Doppler ultrasound 2013   venous duplex doppler   H/O Doppler ultrasound 2012   venous duplex doppler   H/O echocardiogram 03/14/2011   EF 65-70%   H/O exercise stress test 2006   neg bruce protocol excercise stress test   Hiatal hernia    Hypertension    Prostate cancer [facility])    s/p radiation    Social History   Socioeconomic History   Marital status: Married    Spouse name: Not on file   Number of children: 3   Years of education: Not on file   Highest education level: Not on file  Occupational History   Not on file   Tobacco Use   Smoking status: Former    Packs/day: 0.75    Years: 20.00    Total pack years: 15.00    Types: Cigarettes    Passive exposure: Past   Smokeless tobacco: Never  Vaping Use   Vaping Use: Never used  Substance and Sexual Activity   Alcohol use: No   Drug use: No   Sexual activity: Not on file  Other Topics Concern   Not on file  Social History Narrative   Not on file   Social Determinants of Health   Financial Resource Strain: Low Risk  (12/04/2021)   Overall Financial Resource Strain (CARDIA)    Difficulty of Paying Living Expenses: Not hard at all  Food Insecurity: No Food Insecurity (08/16/2022)   Hunger Vital Sign    Worried About Running Out of Food in the Last Year: Never true    Ran Out of Food in the Last Year: Never true  Transportation Needs: No Transportation Needs (08/16/2022)   PRAPARE - Hydrologist (Medical): No    Lack of Transportation (Non-Medical): No  Physical Activity: Inactive (12/04/2021)   Exercise Vital Sign    Days of Exercise per Week: 0 days    Minutes of Exercise per Session: 0 min  Stress: No Stress Concern Present (  12/04/2021)   [employer] [place]    Feeling of Stress : Not at all  Social Connections: Not on file  Intimate Partner Violence: Not on file    Past Surgical History:  Procedure Laterality Date   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   Lisinopril     cough   Metformin Diarrhea    Current Outpatient Medications on File Prior to Visit  Medication Sig Dispense Refill   ACCU-CHEK GUIDE test strip use 4 TIMES DAILY 300 strip 3   Accu-Chek Softclix Lancets lancets 1 Stick by route daily. 100 each 3   acetaminophen (TYLENOL) 500 MG tablet Take by mouth as needed.     amLODipine (NORVASC) 10 MG tablet Take 1 tablet (10 mg total) by mouth daily. 90 tablet 3   apixaban (ELIQUIS) 5 MG TABS tablet Take 5 mg by mouth 2 (two) times daily.     Ascorbic Acid (VITAMIN C) 1000 MG tablet Take 1 tablet by mouth daily.     blood glucose meter kit and supplies KIT Dispense based on patient and insurance preference. Use up to four times daily as directed. 1 each 0   Blood Glucose Monitoring Suppl (ONE TOUCH ULTRA 2) w/Device KIT USE TO TEST BLOOD GLUCOSE TWICE DAILY 1 each 0   Carboxymethylcellulose Sod PF (THERATEARS PF) 0.25 % SOLN INSTILL 1 DROP IN BOTH EYES FOUR TIMES A DAY AS NEEDED     Cholecalciferol 50 MCG (2000 UT) CAPS 1 capsule Orally Once a day     clotrimazole-betamethasone (LOTRISONE) cream Apply 1 application. topically daily. Thin layer twice a day 45 g 2   fluticasone (FLONASE) 50 MCG/ACT nasal spray Place into the nose as needed.     LANTUS SOLOSTAR 100 UNIT/ML Solostar Pen Inject 10 Units into the skin daily. 15 mL 0   losartan (COZAAR) 100 MG tablet Take 1 tablet (100 mg total) by mouth daily. 90 tablet 3   omeprazole (PRILOSEC) 20 MG capsule 1 capsule Orally twice a day     ondansetron (ZOFRAN) 4 MG tablet Take 1 tablet (4 mg total) by mouth every 8 (eight) hours as needed for nausea or vomiting. 20 tablet 0   polyethylene glycol (MIRALAX / GLYCOLAX) 17 g packet TAKE 17 GRAMS BY MOUTH DAILY     potassium chloride (MICRO-K) 10 MEQ CR capsule Take 1 capsule by mouth daily. SCHEDULE OFFICE VISIT FOR FUTURE REFILLS 30 capsule  0   torsemide (DEMADEX) 5 MG tablet Take 1 tablet (5 mg total) by mouth daily. 90 tablet 3   No current facility-administered medications on file prior to visit.    BP 134/84   Pulse (!) 56   Temp (!)  97.5 F (36.4 C) (Oral)   Ht 5' 6"$  (1.676 m)   Wt 181 lb (82.1 kg)   SpO2 98%   BMI 29.21 kg/m       Objective:   Physical Exam Vitals and nursing note reviewed.  Constitutional:      Appearance: Normal appearance.  Cardiovascular:     Rate and Rhythm: Normal rate and regular rhythm.     Pulses: Normal pulses.     Heart sounds: Normal heart sounds.  Pulmonary:     Breath sounds: Normal breath sounds.  Musculoskeletal:        General: Normal range of motion.  Skin:    General: Skin is warm and dry.  Neurological:     General: No focal deficit present.     Mental Status: He is alert and oriented to person, place, and time.  Psychiatric:        Mood and Affect: Mood normal.        Behavior: Behavior normal.        Thought Content: Thought content normal.        Judgment: Judgment normal.        Assessment & Plan:  1. Type 2 diabetes mellitus with hyperglycemia, unspecified whether long term insulin use (HCC)  - POC HgB A1c- 6.2 - will decrease his insulin to 5 units daily  - Follow up in 6 months - Insulin Pen Needle (PEN NEEDLES) 31G X 6 MM MISC; 1 Needle by Does not apply route daily.  Dispense: 300 each; Refill: 0  2. Essential hypertension - Well controlled.  - No change in medications   [name], NP

## 2022-11-28 ENCOUNTER — Encounter: Payer: Self-pay | Admitting: Adult Health

## 2022-11-28 ENCOUNTER — Telehealth: Payer: Self-pay | Admitting: Adult Health

## 2022-11-28 NOTE — Telephone Encounter (Signed)
Pt's daughter Dakota Lewis called to say Pt's sugar was extremely high.  Call was transferred to the Triage Nurse.  Daughter called back about 10-15 minutes later and said Triage Nurse told her she should talk to [place].  Daughter is asking  that CMA call her back at the earliest convenience.

## 2022-11-28 NOTE — Telephone Encounter (Signed)
Spoke to [place] and she stated that pt blood sugar was at 288 probably from cough syrup not from insulin decreasing. Please see MyChart message and advise.

## 2022-12-10 ENCOUNTER — Telehealth: Payer: Self-pay | Admitting: Adult Health

## 2022-12-10 ENCOUNTER — Telehealth: Payer: Self-pay

## 2022-12-10 NOTE — Progress Notes (Unsigned)
Care Management & Coordination Services Pharmacy Note  12/11/2022 Name:  Dakota Lewis MRN:  [id number] DOB:  December 03, 1932  Summary: BP at goal <140/90 A1C at goal <7, reports sugars well-controlled at home Denies signs of bleeding with Eliquis  Recommendations/Changes made from today's visit: -Continue to check BP at least once weekly and keep a log -Continue to check sugars twice daily, notify office if you start to have repeated lows or highs, discussed ranges -Notify office of signs of a bleed or if issues obtaining eliquis  Follow up plan: BP call in 2 months Pharmacist visit in 4 months   Subjective: Dakota Lewis is an 89 y.o. year old male who is a primary patient of [name], NP.  The care coordination team was consulted for assistance with disease management and care coordination needs.    Engaged with patient by telephone for follow up visit.  Recent office visits: 11/26/22 [name], NP - For T2DM, decrease lantus by 5 units daily 09/25/22 [name], NP - Stomach pain, no med changes 09/18/22 Carolann Littler, MD - For stomach pain/diarrhea, stop metformin and flonase  Recent consult visits: 09/19/22 Silvano Rusk and Liver - Ultrasound for liver cancer screening, recommend re-screen every 6 months 09/11/22 [facility] - For T2DM, no other information available 09/05/22 Lolita Lenz [facility]) For chronic A-fib. A1C 10.9  Hospital visits: 08/10/22 [place] - For T2DM, no other details availalbe   Objective:  Lab Results  Component Value Date   CREATININE 1.10 09/18/2022   BUN 28 (H) 09/18/2022   GFR 60.25 09/18/2022   GFRNONAA >60 03/21/2022   GFRAA  03/15/2011    >60        The eGFR has been calculated using the MDRD equation. This calculation has not been validated in all clinical situations. eGFR's persistently <60 mL/min signify possible Chronic Kidney Disease.   NA 134 (L) 09/18/2022   K 4.7 09/18/2022   CALCIUM 10.1  09/18/2022   CO2 24 09/18/2022   GLUCOSE 139 (H) 09/18/2022    Lab Results  Component Value Date/Time   HGBA1C 6.2 (A) 11/26/2022 09:00 AM   HGBA1C 12.7 (A) 08/16/2022 08:38 AM   HGBA1C 7.2 (H) 07/12/2021 09:44 AM   HGBA1C 6.8 06/16/2020 12:00 AM   HGBA1C 6.3 07/07/2019 09:20 AM   GFR 60.25 09/18/2022 04:07 PM   GFR 77.48 08/28/2022 09:56 AM    Last diabetic Eye exam:  Lab Results  Component Value Date/Time   HMDIABEYEEXA No Retinopathy 04/06/2016 12:00 AM    Last diabetic Foot exam:  Lab Results  Component Value Date/Time   HMDIABFOOTEX Abstracted/Dr. Marily Memos 12/19/2016 12:00 AM     Lab Results  Component Value Date   CHOL 145 08/28/2022   HDL 57.90 08/28/2022   LDLCALC 56 08/28/2022   LDLDIRECT 100.0 07/07/2019   TRIG 158.0 (H) 08/28/2022   CHOLHDL 3 08/28/2022       Latest Ref Rng & Units 09/18/2022    4:07 PM 08/28/2022    9:56 AM 07/12/2021    9:44 AM  Hepatic Function  Total Protein 6.0 - 8.3 g/dL 7.9  7.2  7.0   Albumin 3.5 - 5.2 g/dL 4.4  4.0  4.3   AST 0 - 37 U/L 32  29  27   ALT 0 - 53 U/L 43  32  31   Alk Phosphatase 39 - 117 U/L 69  60  70   Total Bilirubin 0.2 - 1.2 mg/dL 0.8  0.6  0.8     Lab Results  Component Value Date/Time   TSH 0.44 08/28/2022 09:56 AM   TSH 1.98 08/16/2022 09:14 AM       Latest Ref Rng & Units 09/18/2022    4:07 PM 08/28/2022    9:56 AM 03/21/2022    8:22 PM  CBC  WBC 4.0 - 10.5 K/uL 7.8  6.0  8.0   Hemoglobin 13.0 - 17.0 g/dL 15.0  13.1  14.0   Hematocrit 39.0 - 52.0 % 43.7  39.1  42.5   Platelets 150.0 - 400.0 K/uL 317.0  213.0  169     Lab Results  Component Value Date/Time   VD25OH 58.53 05/25/2018 08:24 AM   VD25OH 48.5 03/28/2017 12:00 AM   VITAMINB12 255 06/16/2020 12:00 AM    Clinical ASCVD: No  The ASCVD Risk score (Arnett DK, et al., 2019) failed to calculate for the following reasons:   The 2019 ASCVD risk score is only valid for ages 68 to 17        08/28/2022    9:21 AM 02/27/2022     8:18 AM 12/04/2021    8:32 AM  Depression screen PHQ 2/9  Decreased Interest 0 0 0  Down, Depressed, Hopeless 0 0 0  PHQ - 2 Score 0 0 0  Altered sleeping  1   Tired, decreased energy  1   Change in appetite  2   Feeling bad or failure about yourself   0   Trouble concentrating  0   Moving slowly or fidgety/restless  0   Suicidal thoughts  0   PHQ-9 Score  4   Difficult doing work/chores  Not difficult at all      Social History   Tobacco Use  Smoking Status Former   Packs/day: 0.75   Years: 20.00   Total pack years: 15.00   Types: Cigarettes   Passive exposure: Past  Smokeless Tobacco Never   BP Readings from Last 3 Encounters:  11/26/22 134/84  09/25/22 130/64  09/18/22 (!) 154/66   Pulse Readings from Last 3 Encounters:  11/26/22 (!) 56  09/25/22 60  09/18/22 60   Wt Readings from Last 3 Encounters:  11/26/22 181 lb (82.1 kg)  09/25/22 173 lb (78.5 kg)  09/18/22 175 lb 12.8 oz (79.7 kg)   BMI Readings from Last 3 Encounters:  11/26/22 29.21 kg/m  09/25/22 27.50 kg/m  09/18/22 28.16 kg/m    Allergies  Allergen Reactions   Lidocaine     ANTI ITCH CREAM, NO SPECIFICATIONS IN RECORDS.   Lisinopril     cough   Metformin Diarrhea    Medications Reviewed Today     Reviewed by Maren Reamer, RPH (Pharmacist) on 12/11/22 at 1156  Med List Status: <None>   Medication Order Taking? Sig Documenting Provider Last Dose Status Informant  ACCU-CHEK GUIDE test strip [id number] No use 4 TIMES DAILY Nafziger, Tommi Rumps, NP Taking Active   Accu-Chek Softclix Lancets lancets [id number] No 1 Stick by route daily. Nafziger, Tommi Rumps, NP Taking Active   acetaminophen (TYLENOL) 500 MG tablet [id number] No Take by mouth as needed. [provider] Taking Active   amLODipine (NORVASC) 10 MG tablet [id number] No Take 1 tablet (10 mg total) by mouth daily. Nafziger, Tommi Rumps, NP Taking Active   apixaban (ELIQUIS) 5 MG TABS tablet [id number] No Take 5 mg by mouth 2 (two) times  daily. [provider] Taking Active   Ascorbic Acid (VITAMIN C) 1000 MG tablet [id number]  No Take 1 tablet by mouth daily. [provider] Taking Active   blood glucose meter kit and supplies KIT [id number] No Dispense based on patient and insurance preference. Use up to four times daily as directed. Nafziger, Tommi Rumps, NP Taking Active   Blood Glucose Monitoring Suppl (ONE TOUCH ULTRA 2) w/Device KIT [id number] No USE TO TEST BLOOD GLUCOSE TWICE DAILY Nafziger, Tommi Rumps, NP Taking Active   Carboxymethylcellulose Sod PF (THERATEARS PF) 0.25 % SOLN [id number] No INSTILL 1 DROP IN BOTH EYES FOUR TIMES A DAY AS NEEDED [provider] Taking Active   Cholecalciferol 50 MCG (2000 UT) CAPS [id number] No 1 capsule Orally Once a day [provider] Taking Active   clotrimazole-betamethasone (LOTRISONE) cream [id number] No Apply 1 application. topically daily. Thin layer twice a day Nafziger, Tommi Rumps, NP Taking Active   fluticasone (FLONASE) 50 MCG/ACT nasal spray [id number] No Place into the nose as needed. [provider] Taking Active   Insulin Pen Needle (PEN NEEDLES) 31G X 6 MM MISC [id number]  1 Needle by Does not apply route daily. Nafziger, Tommi Rumps, NP  Active   LANTUS SOLOSTAR 100 UNIT/ML Solostar Pen [id number] No Inject 10 Units into the skin daily.  Patient taking differently: Inject 5 Units into the skin daily.   Nafziger, Tommi Rumps, NP Taking Active   losartan (COZAAR) 100 MG tablet [id number] No Take 1 tablet (100 mg total) by mouth daily. Nafziger, Tommi Rumps, NP Taking Active   omeprazole (PRILOSEC) 20 MG capsule [id number] No 1 capsule Orally twice a day [provider] Taking Active   ondansetron (ZOFRAN) 4 MG tablet [id number] No Take 1 tablet (4 mg total) by mouth every 8 (eight) hours as needed for nausea or vomiting. Burchette, Alinda Sierras, MD Taking Active   polyethylene glycol (MIRALAX / GLYCOLAX) 17 g packet [id number] No TAKE 17 GRAMS BY MOUTH DAILY [provider] Taking Active   potassium chloride (MICRO-K) 10 MEQ CR capsule [id number] No Take 1 capsule by mouth daily. SCHEDULE OFFICE VISIT FOR FUTURE REFILLS Nafziger, Tommi Rumps, NP Taking Active   torsemide (DEMADEX) 5 MG tablet [id number] No Take 1 tablet (5 mg total) by mouth daily. Nafziger, Tommi Rumps, NP Taking Active             SDOH:  (Social Determinants of Health) assessments and interventions performed: Yes SDOH Interventions    [place] Coordination from 12/11/2022 in [place] Telephone from 08/16/2022 in [place] Office Visit from 02/27/2022 in [place] at [place] Interventions Intervention Not Indicated Intervention Not Indicated --  Housing Interventions Intervention Not Indicated -- --  Transportation Interventions Intervention Not Indicated Intervention Not Indicated  [patient drives self short distances,  daughter provides transportation to provider appointments] --  Utilities Interventions Intervention Not Indicated -- --  Depression Interventions/Treatment  -- -- Counseling       Medication Assistance: None required.  Patient affirms current coverage meets needs.  Medication Access: Within the past 30 days, how often has patient missed a dose of medication? None Is a pillbox or other method used to improve adherence? Yes  Factors that may affect medication adherence? no barriers identified Are meds synced by current pharmacy? No  Are meds delivered by current pharmacy? Yes  Does patient experience delays in picking up medications due to transportation concerns? No   [facility] Reviewed: Is patient disadvantaged to use [facility]?: Yes  Current Rx insurance plan:  [employer] Name and location of Current pharmacy:  [place], [address] [place] [address] Phone:  [phone] Fax: [phone]  [facility] services reviewed with patient today?: No  Patient requests to transfer care to [facility]?: No  Reason patient declined to change pharmacies: [facility]  Compliance/Adherence/Medication fill history: Care Gaps: AWV - completed 12/04/2021 Last BP - 134/84 on 11/26/2022 Last A1C - 6.2 on 11/26/2022 Eye exam - completed 08/17/2019 Foot exam - completed 07/12/2021 Covid - postponed  Star-Rating Drugs: Losartan '100mg'$  PDC 89%   Assessment/Plan   Hypertension (BP goal <140/90) -Controlled -Current treatment: Losartan '100mg'$  1 qd Appropriate, Effective, Safe, Accessible Torsemide '5mg'$  1 qd Appropriate, Effective, Safe, Accessible Amlodipine '10mg'$  1 qd Appropriate, Effective, Safe, Accessible -Medications previously tried: HCTZ -Current home readings: checks every 2-3 days, reports readings are mostly in 120s-140s/60-70s -Current dietary habits: mindful of salt intake -Current exercise habits: Not discussed -Denies hypotensive/hypertensive symptoms -Educated on BP goals and benefits of medications for prevention of heart attack, stroke and kidney damage; Daily salt intake goal < 2300 mg; Importance of home blood pressure monitoring; Proper BP monitoring technique; -Counseled to monitor BP at home at least once weekly, document, and provide log at future appointments -Recommended to continue current medication  Diabetes (A1c goal <7%) -Controlled -Current medications: Lantus 5 units daily Appropriate, Effective, Safe, Accessible -Medications previously tried: Metformin  -Current home glucose readings Checks twice daily, reports sugars mostly range from 130-140s and denies any lows -Denies hypoglycemic/hyperglycemic symptoms -Current meal patterns:  Not discussed -Current exercise: Not discussed -Educated on A1c and blood sugar goals; Complications of diabetes including kidney damage,  retinal damage, and cardiovascular disease; Prevention and management of hypoglycemic episodes; Benefits of routine self-monitoring of blood sugar; -Counseled to check feet daily and get yearly eye exams -Recommended to continue current medication -[place] [facility]  Query Atrial Fibrillation (Goal: prevent stroke and major bleeding) -Controlled -CHADSVASC: 6 -Current treatment: Anticoagulation: Eliquis '5mg'$  BID Appropriate, Effective, Safe, Accessible -Medications previously tried: None -Home BP and HR readings: see above  -Counseled on increased risk of stroke due to Afib and benefits of anticoagulation for stroke prevention; importance of adherence to anticoagulant exactly as prescribed; bleeding risk associated with Eliquis and importance of self-monitoring for signs/symptoms of bleeding; avoidance of NSAIDs due to increased bleeding risk with anticoagulants; importance of regular laboratory monitoring; seeking medical attention after a head injury or if there is blood in the urine/stool; -Recommended to continue current medication -Notify us if you can no longer get Eliquis from [place] Pharmacist [phone]

## 2022-12-10 NOTE — Progress Notes (Signed)
Care Management & Coordination Services Pharmacy Team  Reason for Encounter: Appointment Reminder  Contacted patient to confirm telephone appointment with Burman Riis, PharmD on 12/11/2022 at 11:15. Spoke with patient on 12/10/2022   Do you have any problems getting your medications? Patient denies  What is your top health concern you would like to discuss at your upcoming visit? Patent denies  Have you seen any other providers since your last visit with PCP? Patient denies  Care Gaps: AWV - completed 12/04/2021 Last BP - 134/84 on 11/26/2022 Last A1C - 6.2 on 11/26/2022 Eye exam - completed 08/17/2019 Foot exam - completed 07/12/2021 Covid - postponed   Star Rating Drugs: Losartan 100 mg - last filled 08/28/2022 90 DS at [facility] Drug verified with [place] Pharmacist Assistant [phone]

## 2022-12-10 NOTE — Telephone Encounter (Signed)
Contacted [name] to schedule their annual wellness visit. Appointment made for 12/17/22.  [name] AWV direct phone # [phone]

## 2022-12-11 ENCOUNTER — Ambulatory Visit: Payer: Medicare PPO

## 2022-12-17 ENCOUNTER — Telehealth: Payer: Self-pay | Admitting: Adult Health

## 2022-12-17 ENCOUNTER — Encounter (INDEPENDENT_AMBULATORY_CARE_PROVIDER_SITE_OTHER): Payer: Medicare PPO | Admitting: Family Medicine

## 2022-12-17 NOTE — Telephone Encounter (Signed)
Contacted Dakota Lewis to schedule their annual wellness visit. Appointment made for 12/20/22.  [name] AWV direct phone # [phone]  Patient called need to r/s 3/12 appt he had funeral to attend  r/s appt to 12/20/22

## 2022-12-17 NOTE — Progress Notes (Unsigned)
[place]

## 2022-12-18 ENCOUNTER — Telehealth: Payer: Self-pay | Admitting: Adult Health

## 2022-12-18 ENCOUNTER — Other Ambulatory Visit: Payer: Self-pay

## 2022-12-18 DIAGNOSIS — I10 Essential (primary) hypertension: Secondary | ICD-10-CM

## 2022-12-18 MED ORDER — POTASSIUM CHLORIDE ER 10 MEQ PO CPCR: ORAL_CAPSULE | ORAL | 0 refills | Status: DC

## 2022-12-18 MED ORDER — LANTUS SOLOSTAR 100 UNIT/ML ~~LOC~~ SOPN: 10.0000 [IU] | PEN_INJECTOR | Freq: Every day | SUBCUTANEOUS | 0 refills | Status: DC

## 2022-12-18 MED ORDER — TORSEMIDE 5 MG PO TABS: 5.0000 mg | ORAL_TABLET | Freq: Every day | ORAL | 0 refills | Status: DC

## 2022-12-18 MED ORDER — LOSARTAN POTASSIUM 100 MG PO TABS: 100.0000 mg | ORAL_TABLET | Freq: Every day | ORAL | 0 refills | Status: DC

## 2022-12-18 MED ORDER — AMLODIPINE BESYLATE 10 MG PO TABS: 10.0000 mg | ORAL_TABLET | Freq: Every day | ORAL | 0 refills | Status: DC

## 2022-12-18 MED ORDER — OMEPRAZOLE 20 MG PO CPDR: DELAYED_RELEASE_CAPSULE | ORAL | 0 refills | Status: DC

## 2022-12-18 NOTE — Telephone Encounter (Signed)
Rx refilled.

## 2022-12-18 NOTE — Telephone Encounter (Addendum)
*  Dakota Lewis with [facility] called to request 90 day refills of the following:  LANTUS SOLOSTAR 100 UNIT/ML Solostar Pen amLODipine (NORVASC) 10 MG tablet  losartan (COZAAR) 100 MG tablet  omeprazole (PRILOSEC) 20 MG capsule  potassium chloride (MICRO-K) 10 MEQ CR capsule  torsemide (DEMADEX) 5 MG tablet   LOV:  11/26/22  [facility] - [place], [place] Phone: [phone]  Fax: [phone]

## 2022-12-20 ENCOUNTER — Ambulatory Visit (INDEPENDENT_AMBULATORY_CARE_PROVIDER_SITE_OTHER): Payer: Medicare PPO

## 2022-12-20 VITALS — Ht 66.0 in | Wt 181.0 lb

## 2022-12-20 DIAGNOSIS — Z0000 Encounter for general adult medical examination without abnormal findings: Secondary | ICD-10-CM | POA: Diagnosis not present

## 2022-12-20 NOTE — Patient Instructions (Addendum)
Dakota Lewis , Thank you for taking time to come for your Medicare Wellness Visit. I appreciate your ongoing commitment to your health goals. Please review the following plan we discussed and let me know if I can assist you in the future.   These are the goals we discussed:  Goals       No current goals (pt-stated)      Patient Stated      12/04/2021, no goals        This is a list of the screening recommended for you and due dates:  Health Maintenance  Topic Date Due   Eye exam for diabetics  12/20/2022*   Complete foot exam   12/21/2022*   COVID-19 Vaccine (3 - Moderna risk series) 01/05/2023*   Hemoglobin A1C  05/27/2023   Medicare Annual Wellness Visit  12/20/2023   DTaP/Tdap/Td vaccine (5 - Td or Tdap) 03/21/2032   Pneumonia Vaccine  Completed   Flu Shot  Completed   Zoster (Shingles) Vaccine  Completed   HPV Vaccine  Aged Out  *Topic was postponed. The date shown is not the original due date.    Advanced directives: Please bring a copy of your health care power of attorney and living will to the office to be added to your chart at your convenience.   Conditions/risks identified: None  Next appointment: Follow up in one year for your annual wellness visit.   Preventive Care 87 Years and Older, Male  Preventive care refers to lifestyle choices and visits with your health care provider that can promote health and wellness. What does preventive care include? A yearly physical exam. This is also called an annual well check. Dental exams once or twice a year. Routine eye exams. Ask your health care provider how often you should have your eyes checked. Personal lifestyle choices, including: Daily care of your teeth and gums. Regular physical activity. Eating a healthy diet. Avoiding tobacco and drug use. Limiting alcohol use. Practicing safe sex. Taking low doses of aspirin every day. Taking vitamin and mineral supplements as recommended by your health care  provider. What happens during an annual well check? The services and screenings done by your health care provider during your annual well check will depend on your age, overall health, lifestyle risk factors, and family history of disease. Counseling  Your health care provider may ask you questions about your: Alcohol use. Tobacco use. Drug use. Emotional well-being. Home and relationship well-being. Sexual activity. Eating habits. History of falls. Memory and ability to understand (cognition). Work and work Statistician. Screening  You may have the following tests or measurements: Height, weight, and BMI. Blood pressure. Lipid and cholesterol levels. These may be checked every 5 years, or more frequently if you are over 16 years old. Skin check. Lung cancer screening. You may have this screening every year starting at age 73 if you have a 30-pack-year history of smoking and currently smoke or have quit within the past 15 years. Fecal occult blood test (FOBT) of the stool. You may have this test every year starting at age 15. Flexible sigmoidoscopy or colonoscopy. You may have a sigmoidoscopy every 5 years or a colonoscopy every 10 years starting at age 10. Prostate cancer screening. Recommendations will vary depending on your family history and other risks. Hepatitis C blood test. Hepatitis B blood test. Sexually transmitted disease (STD) testing. Diabetes screening. This is done by checking your blood sugar (glucose) after you have not eaten for a while (fasting). You  may have this done every 1-3 years. Abdominal aortic aneurysm (AAA) screening. You may need this if you are a current or former smoker. Osteoporosis. You may be screened starting at age 66 if you are at high risk. Talk with your health care provider about your test results, treatment options, and if necessary, the need for more tests. Vaccines  Your health care provider may recommend certain vaccines, such  as: Influenza vaccine. This is recommended every year. Tetanus, diphtheria, and acellular pertussis (Tdap, Td) vaccine. You may need a Td booster every 10 years. Zoster vaccine. You may need this after age 43. Pneumococcal 13-valent conjugate (PCV13) vaccine. One dose is recommended after age 61. Pneumococcal polysaccharide (PPSV23) vaccine. One dose is recommended after age 72. Talk to your health care provider about which screenings and vaccines you need and how often you need them. This information is not intended to replace advice given to you by your health care provider. Make sure you discuss any questions you have with your health care provider. Document Released: 10/20/2015 Document Revised: 06/12/2016 Document Reviewed: 07/25/2015 Elsevier Interactive Patient Education  2017 [place] Prevention in the Home Falls can cause injuries. They can happen to people of all ages. There are many things you can do to make your home safe and to help prevent falls. What can I do on the outside of my home? Regularly fix the edges of walkways and driveways and fix any cracks. Remove anything that might make you trip as you walk through a door, such as a raised step or threshold. Trim any bushes or trees on the path to your home. Use bright outdoor lighting. Clear any walking paths of anything that might make someone trip, such as rocks or tools. Regularly check to see if handrails are loose or broken. Make sure that both sides of any steps have handrails. Any raised decks and porches should have guardrails on the edges. Have any leaves, snow, or ice cleared regularly. Use sand or salt on walking paths during winter. Clean up any spills in your garage right away. This includes oil or grease spills. What can I do in the bathroom? Use night lights. Install grab bars by the toilet and in the tub and shower. Do not use towel bars as grab bars. Use non-skid mats or decals in the tub or  shower. If you need to sit down in the shower, use a plastic, non-slip stool. Keep the floor dry. Clean up any water that spills on the floor as soon as it happens. Remove soap buildup in the tub or shower regularly. Attach bath mats securely with double-sided non-slip rug tape. Do not have throw rugs and other things on the floor that can make you trip. What can I do in the bedroom? Use night lights. Make sure that you have a light by your bed that is easy to reach. Do not use any sheets or blankets that are too big for your bed. They should not hang down onto the floor. Have a firm chair that has side arms. You can use this for support while you get dressed. Do not have throw rugs and other things on the floor that can make you trip. What can I do in the kitchen? Clean up any spills right away. Avoid walking on wet floors. Keep items that you use a lot in easy-to-reach places. If you need to reach something above you, use a strong step stool that has a grab bar. Keep electrical  cords out of the way. Do not use floor polish or wax that makes floors slippery. If you must use wax, use non-skid floor wax. Do not have throw rugs and other things on the floor that can make you trip. What can I do with my stairs? Do not leave any items on the stairs. Make sure that there are handrails on both sides of the stairs and use them. Fix handrails that are broken or loose. Make sure that handrails are as long as the stairways. Check any carpeting to make sure that it is firmly attached to the stairs. Fix any carpet that is loose or worn. Avoid having throw rugs at the top or bottom of the stairs. If you do have throw rugs, attach them to the floor with carpet tape. Make sure that you have a light switch at the top of the stairs and the bottom of the stairs. If you do not have them, ask someone to add them for you. What else can I do to help prevent falls? Wear shoes that: Do not have high heels. Have  rubber bottoms. Are comfortable and fit you well. Are closed at the toe. Do not wear sandals. If you use a stepladder: Make sure that it is fully opened. Do not climb a closed stepladder. Make sure that both sides of the stepladder are locked into place. Ask someone to hold it for you, if possible. Clearly mark and make sure that you can see: Any grab bars or handrails. First and last steps. Where the edge of each step is. Use tools that help you move around (mobility aids) if they are needed. These include: Canes. Walkers. Scooters. Crutches. Turn on the lights when you go into a dark area. Replace any light bulbs as soon as they burn out. Set up your furniture so you have a clear path. Avoid moving your furniture around. If any of your floors are uneven, fix them. If there are any pets around you, be aware of where they are. Review your medicines with your doctor. Some medicines can make you feel dizzy. This can increase your chance of falling. Ask your doctor what other things that you can do to help prevent falls. This information is not intended to replace advice given to you by your health care provider. Make sure you discuss any questions you have with your health care provider. Document Released: 07/20/2009 Document Revised: 02/29/2016 Document Reviewed: 10/28/2014 Elsevier Interactive Patient Education  2017 [employer].

## 2022-12-20 NOTE — Progress Notes (Signed)
Subjective:   Dakota Lewis is a 89 y.o. male who presents for Medicare Annual/Subsequent preventive examination.  Review of Systems    Virtual Visit via Telephone Note  I connected with  Dakota Lewis on 12/20/22 at 10:15 AM EDT by telephone and verified that I am speaking with the correct person using two identifiers.  Location: Patient: Home Provider: Office Persons participating in the virtual visit: patient/Nurse Health Advisor   I discussed the limitations, risks, security and privacy concerns of performing an evaluation and management service by telephone and the availability of in person appointments. The patient expressed understanding and agreed to proceed.  Interactive audio and video telecommunications were attempted between this nurse and patient, however failed, due to patient having technical difficulties OR patient did not have access to video capability.  We continued and completed visit with audio only.  Some vital signs may be absent or patient reported.   Criselda Peaches, LPN  Cardiac Risk Factors include: advanced age (>49men, >42 women);male gender;hypertension;diabetes mellitus     Objective:    Today's Vitals   12/20/22 1010  Weight: 181 lb (82.1 kg)  Height: 5\' 6"  (1.676 m)   Body mass index is 29.21 kg/m.     12/20/2022   10:18 AM 03/21/2022    6:49 PM 12/04/2021    8:32 AM  Advanced Directives  Does Patient Have a Medical Advance Directive? Yes No Yes  Type of Paramedic of [place];Living will  [place];Living will  Copy of [place] in Chart? No - copy requested  No - copy requested  Would patient like information on creating a medical advance directive?  No - Patient declined     Current Medications (verified) Outpatient Encounter Medications as of 12/20/2022  Medication Sig   ACCU-CHEK GUIDE test strip use 4 TIMES DAILY   Accu-Chek Softclix Lancets lancets 1 Stick by route  daily.   acetaminophen (TYLENOL) 500 MG tablet Take by mouth as needed.   amLODipine (NORVASC) 10 MG tablet Take 1 tablet (10 mg total) by mouth daily.   apixaban (ELIQUIS) 5 MG TABS tablet Take 5 mg by mouth 2 (two) times daily.   Ascorbic Acid (VITAMIN C) 1000 MG tablet Take 1 tablet by mouth daily.   blood glucose meter kit and supplies KIT Dispense based on patient and insurance preference. Use up to four times daily as directed.   Blood Glucose Monitoring Suppl (ONE TOUCH ULTRA 2) w/Device KIT USE TO TEST BLOOD GLUCOSE TWICE DAILY   Carboxymethylcellulose Sod PF (THERATEARS PF) 0.25 % SOLN INSTILL 1 DROP IN BOTH EYES FOUR TIMES A DAY AS NEEDED   Cholecalciferol 50 MCG (2000 UT) CAPS 1 capsule Orally Once a day   clotrimazole-betamethasone (LOTRISONE) cream Apply 1 application. topically daily. Thin layer twice a day   fluticasone (FLONASE) 50 MCG/ACT nasal spray Place into the nose as needed.   Insulin Pen Needle (PEN NEEDLES) 31G X 6 MM MISC 1 Needle by Does not apply route daily.   LANTUS SOLOSTAR 100 UNIT/ML Solostar Pen Inject 10 Units into the skin daily.   losartan (COZAAR) 100 MG tablet Take 1 tablet (100 mg total) by mouth daily.   omeprazole (PRILOSEC) 20 MG capsule 1 capsule Orally twice a day   ondansetron (ZOFRAN) 4 MG tablet Take 1 tablet (4 mg total) by mouth every 8 (eight) hours as needed for nausea or vomiting.   polyethylene glycol (MIRALAX / GLYCOLAX) 17 g  packet TAKE 17 GRAMS BY MOUTH DAILY   potassium chloride (MICRO-K) 10 MEQ CR capsule Take 1 capsule by mouth daily. SCHEDULE OFFICE VISIT FOR FUTURE REFILLS   torsemide (DEMADEX) 5 MG tablet Take 1 tablet (5 mg total) by mouth daily.   No facility-administered encounter medications on file as of 12/20/2022.    Allergies (verified) Lidocaine, Lisinopril, and Metformin   History: Past Medical History:  Diagnosis Date   Bilateral pulmonary embolism (HCC)    lovenox & coumadin thearpy   Chronic cough    DM  (diabetes mellitus) ([place])    DVT (deep venous thrombosis) (HCC)    left lower   Gastrointestinal bleed    prior   H/O cardiovascular stress test 05/07/2011   normal study, low risk scan   H/O Doppler ultrasound 2013   venous duplex doppler   H/O Doppler ultrasound 2012   venous duplex doppler   H/O echocardiogram 03/14/2011   EF 65-70%   H/O exercise stress test 2006   neg bruce protocol excercise stress test   Hiatal hernia    Hypertension    Prostate cancer [facility])    s/p radiation   Past Surgical History:  Procedure Laterality Date   AMPUTATION Left 03/21/2022   Procedure: LEFT RING FINGER AND LEFT SMALL FINGER REVISION AMPUTATION;  Surgeon: Leanora Cover, MD;  Location: [place];  Service: Orthopedics;  Laterality: Left;   event monitor     2012   HERNIA REPAIR Bilateral    1991, 1992   HIATAL HERNIA REPAIR     1997   IVC FILTER INSERTION     PROSTATE SURGERY     Family History  Problem Relation Age of Onset   Stroke Mother    Kidney disease Father    Heart disease Brother    Arthritis Brother    Heart disease Brother    Prostate cancer Brother    Social History   Socioeconomic History   Marital status: Married    Spouse name: Not on file   Number of children: 3   Years of education: Not on file   Highest education level: Not on file  Occupational History   Not on file  Tobacco Use   Smoking status: Former    Packs/day: 0.75    Years: 20.00    Additional pack years: 0.00    Total pack years: 15.00    Types: Cigarettes    Passive exposure: Past   Smokeless tobacco: Never  Vaping Use   Vaping Use: Never used  Substance and Sexual Activity   Alcohol use: No   Drug use: No   Sexual activity: Not on file  Other Topics Concern   Not on file  Social History Narrative   Not on file   Social Determinants of Health   Financial Resource Strain: Low Risk  (12/20/2022)   Overall Financial Resource Strain (CARDIA)    Difficulty of Paying Living Expenses: Not  hard at all  Food Insecurity: No Food Insecurity (12/20/2022)   Hunger Vital Sign    Worried About Running Out of Food in the Last Year: Never true    Ran Out of Food in the Last Year: Never true  Transportation Needs: No Transportation Needs (12/20/2022)   PRAPARE - Hydrologist (Medical): No    Lack of Transportation (Non-Medical): No  Physical Activity: Insufficiently Active (12/20/2022)   Exercise Vital Sign    Days of Exercise per Week: 5 days  Minutes of Exercise per Session: 20 min  Stress: No Stress Concern Present (12/20/2022)   [place]    Feeling of Stress : Not at all  Social Connections: Moderately Integrated (12/20/2022)   Social Connection and Isolation Panel [NHANES]    Frequency of Communication with Friends and Family: More than three times a week    Frequency of Social Gatherings with Friends and Family: More than three times a week    Attends Religious Services: More than 4 times per year    Active Member of [employer] or Organizations: Yes    Attends Archivist Meetings: More than 4 times per year    Marital Status: Widowed    Tobacco Counseling Counseling given: Not Answered   Clinical Intake:  Pre-visit preparation completed: No  Pain : No/denies pain     BMI - recorded: 29.21 Nutritional Status: BMI 25 -29 Overweight Nutritional Risks: None Diabetes: Yes CBG done?: Yes (CBG 132 Taken by patient) CBG resulted in Enter/ Edit results?: Yes Did pt. bring in CBG monitor from home?: No  How often do you need to have someone help you when you read instructions, pamphlets, or other written materials from your doctor or pharmacy?: 1 - Never  Diabetic?  Yes  Interpreter Needed?: NoNutrition Risk Assessment:  Has the patient had any N/V/D within the last 2 months?  No  Does the patient have any non-healing wounds?  No  Has the patient had any  unintentional weight loss or weight gain?  No   Diabetes:  Is the patient diabetic?  Yes  If diabetic, was a CBG obtained today?  Yes  CBG 132 Taken by patient Did the patient bring in their glucometer from home?  No How often do you monitor your CBG's? 2 X daily.   Financial Strains and Diabetes Management:  Are you having any financial strains with the device, your supplies or your medication? No .  Does the patient want to be seen by Chronic Care Management for management of their diabetes?  No  Would the patient like to be referred to a Nutritionist or for Diabetic Management?  No   Diabetic Exams:  Diabetic Eye Exam: Completed Yes. Overdue for diabetic eye exam. Pt has been advised about the importance in completing this exam. A referral has been placed today. Message sent to referral coordinator for scheduling purposes. Advised pt to expect a call from office referred to regarding appt.  Diabetic Foot Exam: Completed Yes. Pt has been advised about the importance in completing this exam. Pt is scheduled for diabetic foot exam on Followed by PCP.    Information entered by :: Rolene Arbour LPN   Activities of Daily Living    12/20/2022   10:17 AM  In your present state of health, do you have any difficulty performing the following activities:  Hearing? 1  Comment Wears hearing ids  Vision? 0  Difficulty concentrating or making decisions? 0  Walking or climbing stairs? 0  Dressing or bathing? 0  Doing errands, shopping? 0  Preparing Food and eating ? N  Using the Toilet? N  In the past six months, have you accidently leaked urine? N  Do you have problems with loss of bowel control? N  Managing your Medications? N  Managing your Finances? N  Housekeeping or managing your Housekeeping? N    Patient Care Team: [name], NP as PCP - General (Family Medicine) Franchot Gallo, MD as Consulting  Physician (Urology) Levora Dredge, Theo Dills, [facility] (Pharmacist)  Indicate any  recent Medical Services you may have received from other than [facility] providers in the past year (date may be approximate).     Assessment:   This is a routine wellness examination for Dakota Lewis.  Hearing/Vision screen Hearing Screening - Comments:: Wears hearing aids Vision Screening - Comments:: Wears reading glasses - up to date with routine eye exams with  [facility]  Dietary issues and exercise activities discussed: Current Exercise Habits: Home exercise routine, Type of exercise: walking;treadmill, Time (Minutes): 20, Frequency (Times/Week): 5, Weekly Exercise (Minutes/Week): 100, Intensity: Moderate, Exercise limited by: None identified   Goals Addressed               This Visit's Progress     No current goals (pt-stated)         Depression Screen    12/20/2022   10:16 AM 08/28/2022    9:21 AM 02/27/2022    8:18 AM 12/04/2021    8:32 AM 10/18/2021   10:48 AM 07/12/2021    8:19 AM 07/07/2020    8:19 AM  PHQ 2/9 Scores  PHQ - 2 Score 0 0 0 0 0 0 0  PHQ- 9 Score   4  5      Fall Risk    12/20/2022   10:18 AM 08/28/2022    9:21 AM 02/27/2022    8:19 AM 12/04/2021    8:32 AM 10/18/2021   10:48 AM  Fall Risk   Falls in the past year? 0 0 1 0 0  Number falls in past yr: 0 0 1  0  Injury with Fall? 0 0 0  0  Risk for fall due to : No Fall Risks No Fall Risks Impaired balance/gait;Other (Comment) Medication side effect   Risk for fall due to: Comment   fell off a ladder    Follow up Falls prevention discussed Falls evaluation completed Falls evaluation completed Falls evaluation completed;Education provided;Falls prevention discussed     FALL RISK PREVENTION PERTAINING TO THE HOME:  Any stairs in or around the home? Yes  If so, are there any without handrails? No  Home free of loose throw rugs in walkways, pet beds, electrical cords, etc? Yes  Adequate lighting in your home to reduce risk of falls? Yes   ASSISTIVE DEVICES UTILIZED TO PREVENT FALLS:  Life alert? Yes   Use of a cane, walker or w/c? Yes  Grab bars in the bathroom? Yes  Shower chair or bench in shower? Yes  Elevated toilet seat or a handicapped toilet? No   TIMED UP AND GO:  Was the test performed? No . Audio Visit   Cognitive Function:        12/20/2022   10:18 AM 12/04/2021    8:34 AM  6CIT Screen  What Year? 0 points 0 points  What month? 0 points 0 points  What time? 0 points 0 points  Count back from 20 0 points 0 points  Months in reverse 0 points 0 points  Repeat phrase 0 points 6 points  Total Score 0 points 6 points    Immunizations Immunization History  Administered Date(s) Administered   Fluad Quad(high Dose 65+) 07/07/2019, 07/07/2020, 07/12/2021, 07/10/2022   Influenza Whole 07/18/2017   Influenza, High Dose Seasonal PF 07/06/2013, 07/22/2014, 06/07/2016, 05/07/2017, 07/21/2017, 07/10/2022   Influenza,inj,Quad PF,6+ Mos 07/18/2017, 07/22/2018   Influenza-Unspecified 07/13/2001, 07/07/2002, 07/08/2003, 08/02/2004, 07/07/2005, 07/26/2005, 08/07/2006, 07/23/2007, 06/07/2008, 07/07/2009, 07/08/2011, 07/07/2012   Moderna SARS-COV2  Booster Vaccination 09/08/2020   Moderna Sars-Covid-2 Vaccination 10/27/2019, 11/24/2019   Pneumococcal Conjugate-13 10/21/2013   Pneumococcal Polysaccharide-23 12/19/2016   Pneumococcal-Unspecified 05/22/2002, 10/21/2013   Td 08/02/2014   Td (Adult),unspecified 08/02/2014   Tdap 09/19/2012, 03/21/2022   Zoster Recombinat (Shingrix) 07/17/2019, 12/11/2019, 07/16/2020   Zoster, Live 11/02/2012    TDAP status: Up to date  Flu Vaccine status: Up to date  Pneumococcal vaccine status: Up to date  Covid-19 vaccine status: Completed vaccines  Qualifies for Shingles Vaccine? Yes   Zostavax completed Yes   Shingrix Completed?: Yes  Screening Tests Health Maintenance  Topic Date Due   OPHTHALMOLOGY EXAM  12/20/2022 (Originally 08/16/2020)   FOOT EXAM  12/21/2022 (Originally 07/12/2022)   COVID-19 Vaccine (3 - Moderna risk series)  01/05/2023 (Originally 10/06/2020)   HEMOGLOBIN A1C  05/27/2023   Medicare Annual Wellness (AWV)  12/20/2023   DTaP/Tdap/Td (5 - Td or Tdap) 03/21/2032   Pneumonia Vaccine 6+ Years old  Completed   INFLUENZA VACCINE  Completed   Zoster Vaccines- Shingrix  Completed   HPV VACCINES  Aged Out    Health Maintenance  There are no preventive care reminders to display for this patient.   Colorectal cancer screening: No longer required.   Lung Cancer Screening: (Low Dose CT Chest recommended if Age 85-80 years, 30 pack-year currently smoking OR have quit w/in 15years.) does not qualify.     Additional Screening:  Hepatitis C Screening: does not qualify; Completed   Vision Screening: Recommended annual ophthalmology exams for early detection of glaucoma and other disorders of the eye. Is the patient up to date with their annual eye exam?  Yes  Who is the provider or what is the name of the office in which the patient attends annual eye exams? [facility] If pt is not established with a provider, would they like to be referred to a provider to establish care? No .   Dental Screening: Recommended annual dental exams for proper oral hygiene  Community Resource Referral / Chronic Care Management:  CRR required this visit?  No   CCM required this visit?  No      Plan:     I have personally reviewed and noted the following in the patient's chart:   Medical and social history Use of alcohol, tobacco or illicit drugs  Current medications and supplements including opioid prescriptions. Patient is not currently taking opioid prescriptions. Functional ability and status Nutritional status Physical activity Advanced directives List of other physicians Hospitalizations, surgeries, and ER visits in previous 12 months Vitals Screenings to include cognitive, depression, and falls Referrals and appointments  In addition, I have reviewed and discussed with patient certain preventive  protocols, quality metrics, and best practice recommendations. A written personalized care plan for preventive services as well as general preventive health recommendations were provided to patient.     Criselda Peaches, LPN   [id number]   Nurse Notes: None

## 2022-12-23 ENCOUNTER — Other Ambulatory Visit: Payer: Self-pay | Admitting: Adult Health

## 2022-12-23 DIAGNOSIS — I10 Essential (primary) hypertension: Secondary | ICD-10-CM

## 2023-01-07 ENCOUNTER — Other Ambulatory Visit: Payer: Self-pay | Admitting: Adult Health

## 2023-01-07 MED ORDER — ACCU-CHEK SOFTCLIX LANCETS MISC: 3 refills | Status: DC

## 2023-01-07 MED ORDER — ACCU-CHEK GUIDE VI STRP: 1.0000 | ORAL_STRIP | Freq: Four times a day (QID) | 3 refills | Status: DC

## 2023-01-08 ENCOUNTER — Telehealth: Payer: Self-pay | Admitting: Adult Health

## 2023-01-08 NOTE — Telephone Encounter (Signed)
Noted  

## 2023-01-08 NOTE — Telephone Encounter (Signed)
Dakota Lewis pt daughter dropped off document Handicap Placard, to be filled out by provider. Patient requested to Call Patient to pick up Dakota Lewis) within 5-days. Document is located in providers tray at front office.  Please advise at [facility] [phone]

## 2023-01-21 ENCOUNTER — Other Ambulatory Visit: Payer: Self-pay | Admitting: Adult Health

## 2023-01-21 DIAGNOSIS — I10 Essential (primary) hypertension: Secondary | ICD-10-CM

## 2023-02-04 ENCOUNTER — Telehealth: Payer: Self-pay

## 2023-02-04 ENCOUNTER — Telehealth: Payer: Self-pay | Admitting: Adult Health

## 2023-02-04 DIAGNOSIS — C44222 Squamous cell carcinoma of skin of right ear and external auricular canal: Secondary | ICD-10-CM | POA: Diagnosis not present

## 2023-02-04 DIAGNOSIS — D485 Neoplasm of uncertain behavior of skin: Secondary | ICD-10-CM | POA: Diagnosis not present

## 2023-02-04 DIAGNOSIS — D0439 Carcinoma in situ of skin of other parts of face: Secondary | ICD-10-CM | POA: Diagnosis not present

## 2023-02-04 DIAGNOSIS — L570 Actinic keratosis: Secondary | ICD-10-CM | POA: Diagnosis not present

## 2023-02-04 DIAGNOSIS — C4442 Squamous cell carcinoma of skin of scalp and neck: Secondary | ICD-10-CM | POA: Diagnosis not present

## 2023-02-04 DIAGNOSIS — L820 Inflamed seborrheic keratosis: Secondary | ICD-10-CM | POA: Diagnosis not present

## 2023-02-04 DIAGNOSIS — C44329 Squamous cell carcinoma of skin of other parts of face: Secondary | ICD-10-CM | POA: Diagnosis not present

## 2023-02-04 NOTE — Telephone Encounter (Signed)
Form filled put. Called pt 2x to advise of update no answer. Form will be placed in front office filing cabinet.

## 2023-02-04 NOTE — Progress Notes (Unsigned)
Care Management & Coordination Services Pharmacy Team  Reason for Encounter: Hypertension  Contacted patient to discuss hypertension disease state. {US HC Outreach:28874}    Current antihypertensive regimen:  Amlodipine 10 mg daily Losartan 100 mg daily Patient verbally confirms he is taking the above medications as directed. {yes/no:20286}  How often are you checking your Blood Pressure? {CHL HP BP Monitoring Frequency:[phone]}  he checks his blood pressure {timing:25218} {before/after:25217} taking his medication.  Current home BP readings: *** DATE:             BP               PULSE   Wrist or arm cuff:  OTC medications including pseudoephedrine or NSAIDs?  Any readings above 180/100? {yes/no:20286} If yes any symptoms of hypertensive emergency? {hypertensive emergency symptoms:25354}  What recent interventions/DTPs have been made by any provider to improve Blood Pressure control since last CPP Visit: ***  Any recent hospitalizations or ED visits since last visit with CPP? {yes/no:20286}  What diet changes have been made to improve Blood Pressure Control?  Patient follows Breakfast -  Lunch -  Dinner -  Caffeine intake -  Salt intake -   What exercise is being done to improve your Blood Pressure Control?  ***  Adherence Review: Is the patient currently on ACE/ARB medication? Yes Does the patient have >5 day gap between last estimated fill dates? No  Care Gaps: AWV - completed 12/20/2022 Last eye exam - 08/17/2019 Last foot exam - 07/12/2021 Last BP - 134/84 on 11/26/2022 Last A1C - 6.2 on 11/26/2022 Covid - overdue   Star Rating Drug: Losartan 100 mg - last filled 12/18/2022 90 DS [facility]  Chart Updates: Recent office visits:  None  Recent consult visits:  None  Hospital visits:  None  Medications: Outpatient Encounter Medications as of 02/04/2023  Medication Sig   Accu-Chek Softclix Lancets lancets 1 Stick by route daily.    acetaminophen (TYLENOL) 500 MG tablet Take by mouth as needed.   amLODipine (NORVASC) 10 MG tablet Take 1 tablet (10 mg total) by mouth daily.   apixaban (ELIQUIS) 5 MG TABS tablet Take 5 mg by mouth 2 (two) times daily.   Ascorbic Acid (VITAMIN C) 1000 MG tablet Take 1 tablet by mouth daily.   blood glucose meter kit and supplies KIT Dispense based on patient and insurance preference. Use up to four times daily as directed.   Blood Glucose Monitoring Suppl (ONE TOUCH ULTRA 2) w/Device KIT USE TO TEST BLOOD GLUCOSE TWICE DAILY   Carboxymethylcellulose Sod PF (THERATEARS PF) 0.25 % SOLN INSTILL 1 DROP IN BOTH EYES FOUR TIMES A DAY AS NEEDED   Cholecalciferol 50 MCG (2000 UT) CAPS 1 capsule Orally Once a day   clotrimazole-betamethasone (LOTRISONE) cream Apply 1 application. topically daily. Thin layer twice a day   fluticasone (FLONASE) 50 MCG/ACT nasal spray Place into the nose as needed.   glucose blood (ACCU-CHEK GUIDE) test strip 1 each by Other route 4 (four) times daily.   Insulin Pen Needle (PEN NEEDLES) 31G X 6 MM MISC 1 Needle by Does not apply route daily.   LANTUS SOLOSTAR 100 UNIT/ML Solostar Pen Inject 10 Units into the skin daily.   losartan (COZAAR) 100 MG tablet Take 1 tablet (100 mg total) by mouth daily.   omeprazole (PRILOSEC) 20 MG capsule 1 capsule Orally twice a day   ondansetron (ZOFRAN) 4 MG tablet Take 1 tablet (4 mg total) by mouth every 8 (eight) hours  as needed for nausea or vomiting.   polyethylene glycol (MIRALAX / GLYCOLAX) 17 g packet TAKE 17 GRAMS BY MOUTH DAILY   potassium chloride (MICRO-K) 10 MEQ CR capsule Take 1 capsule by mouth daily. SCHEDULE OFFICE VISIT FOR FUTURE REFILLS   torsemide (DEMADEX) 5 MG tablet Take 1 tablet (5 mg total) by mouth daily.   No facility-administered encounter medications on file as of 02/04/2023.  Fill History:  Dispensed Days Supply Quantity Provider Pharmacy  amlodipine 10 mg tablet 12/30/2022 90 90 tablet      Dispensed Days  Supply Quantity Provider Pharmacy  clotrimazole-betamethasone 1 %-0.05 % Topical Cream 02/27/2022 30 45 g      Dispensed Days Supply Quantity Provider Pharmacy  fluticasone propionate 50 mcg/actuation nasal spray,suspension 02/27/2022 30 16 g      Dispensed Days Supply Quantity Provider Pharmacy  losartan 100 mg-hydrochlorothiazide 25 mg tablet 06/20/2022 90 90 each      Dispensed Days Supply Quantity Provider Pharmacy  omeprazole 20 mg capsule,delayed release 12/18/2022 45 90 capsule      Dispensed Days Supply Quantity Provider Pharmacy  ondansetron HCl 4 mg tablet 09/18/2022 6 20 tablet      Dispensed Days Supply Quantity Provider Pharmacy  potassium chloride ER 10 mEq capsule,extended release 12/18/2022 90 90 capsule      Dispensed Days Supply Quantity Provider Pharmacy  torsemide 5 mg tablet 12/18/2022 90 90 tablet     Recent Office Vitals: BP Readings from Last 3 Encounters:  11/26/22 134/84  09/25/22 130/64  09/18/22 (!) 154/66   Pulse Readings from Last 3 Encounters:  11/26/22 (!) 56  09/25/22 60  09/18/22 60    Wt Readings from Last 3 Encounters:  12/20/22 181 lb (82.1 kg)  11/26/22 181 lb (82.1 kg)  09/25/22 173 lb (78.5 kg)     Kidney Function Lab Results  Component Value Date/Time   CREATININE 1.10 09/18/2022 04:07 PM   CREATININE 0.87 08/28/2022 09:56 AM   GFR 60.25 09/18/2022 04:07 PM   GFRNONAA >60 03/21/2022 08:22 PM   GFRAA  03/15/2011 04:00 AM    >60        The eGFR has been calculated using the MDRD equation. This calculation has not been validated in all clinical situations. eGFR's persistently <60 mL/min signify possible Chronic Kidney Disease.       Latest Ref Rng & Units 09/18/2022    4:07 PM 08/28/2022    9:56 AM 08/16/2022    9:14 AM  BMP  Glucose 70 - 99 mg/dL 191  478  295   BUN 6 - 23 mg/dL 28  19  28    Creatinine 0.40 - 1.50 mg/dL 6.21  3.08  6.57   Sodium 135 - 145 mEq/L 134  136  133   Potassium 3.5 - 5.1 mEq/L 4.7  4.0   4.2   Chloride 96 - 112 mEq/L 103  103  99   CO2 19 - 32 mEq/L 24  26  27    Calcium 8.4 - 10.5 mg/dL 84.6  9.8  9.7    Inetta Fermo [facility]  Clinical Pharmacist Assistant [phone]

## 2023-02-04 NOTE — Telephone Encounter (Signed)
Patient dropped off document Handicap Placard, to be filled out by provider. Patient requested to  Call Patient to pick up within ASAP since she will be in town today. Document is located in providers tray at front office.Please advise at [facility] [phone].  Please advise.

## 2023-02-04 NOTE — Telephone Encounter (Signed)
Pt daughter came and picked up forms.

## 2023-02-05 ENCOUNTER — Encounter: Payer: Self-pay | Admitting: Adult Health

## 2023-02-14 ENCOUNTER — Telehealth: Payer: Self-pay

## 2023-02-14 NOTE — Progress Notes (Signed)
Done in error.

## 2023-03-13 ENCOUNTER — Other Ambulatory Visit: Payer: Self-pay | Admitting: Adult Health

## 2023-03-26 ENCOUNTER — Other Ambulatory Visit: Payer: Self-pay | Admitting: Nurse Practitioner

## 2023-03-26 DIAGNOSIS — K7460 Unspecified cirrhosis of liver: Secondary | ICD-10-CM | POA: Diagnosis not present

## 2023-03-26 DIAGNOSIS — K7581 Nonalcoholic steatohepatitis (NASH): Secondary | ICD-10-CM | POA: Diagnosis not present

## 2023-03-28 ENCOUNTER — Other Ambulatory Visit: Payer: Self-pay | Admitting: Adult Health

## 2023-03-28 DIAGNOSIS — I10 Essential (primary) hypertension: Secondary | ICD-10-CM

## 2023-04-03 ENCOUNTER — Ambulatory Visit
Admission: RE | Admit: 2023-04-03 | Discharge: 2023-04-03 | Disposition: A | Discharge status: Home / Self Care | Payer: Medicare PPO | Source: Ambulatory Visit | Attending: Nurse Practitioner | Admitting: Nurse Practitioner

## 2023-04-03 DIAGNOSIS — K7460 Unspecified cirrhosis of liver: Secondary | ICD-10-CM | POA: Diagnosis not present

## 2023-04-03 DIAGNOSIS — K824 Cholesterolosis of gallbladder: Secondary | ICD-10-CM | POA: Diagnosis not present

## 2023-04-04 ENCOUNTER — Other Ambulatory Visit: Payer: Self-pay | Admitting: Adult Health

## 2023-04-04 DIAGNOSIS — E0865 Diabetes mellitus due to underlying condition with hyperglycemia: Secondary | ICD-10-CM

## 2023-04-22 ENCOUNTER — Encounter: Payer: Self-pay | Admitting: Adult Health

## 2023-04-22 ENCOUNTER — Ambulatory Visit (INDEPENDENT_AMBULATORY_CARE_PROVIDER_SITE_OTHER): Payer: Medicare PPO | Admitting: Adult Health

## 2023-04-22 VITALS — BP 162/80 | HR 53 | Temp 97.9°F | Ht 66.0 in | Wt 187.0 lb

## 2023-04-22 DIAGNOSIS — M79672 Pain in left foot: Secondary | ICD-10-CM

## 2023-04-22 DIAGNOSIS — M79671 Pain in right foot: Secondary | ICD-10-CM

## 2023-04-22 NOTE — Progress Notes (Signed)
Subjective:    Patient ID: Dakota Lewis, male    DOB: 1933-03-04, 89 y.o.   MRN: [id number]  HPI  89 year old male who  has a past medical history of Bilateral pulmonary embolism (HCC), Chronic cough, DM (diabetes mellitus) (HCC), DVT (deep venous thrombosis) (HCC), Gastrointestinal bleed, H/O cardiovascular stress test (05/07/2011), H/O Doppler ultrasound (2013), H/O Doppler ultrasound (2012), H/O echocardiogram (03/14/2011), H/O exercise stress test (2006), Hiatal hernia, Hypertension, and Prostate cancer (HCC).  He presents to the office today with his daughter  He was recently seen at the [place] in [facility] for bilateral foot pain.  He did have x-rays of both feet which showed mild arthritis changes including Achilles and heel spurs as well as vascular calcifications.  He reports that he was told that he was going to have some testing done for circulation in his bilateral feet.  He was given capsaicin cream which did not help with his symptoms.  He continues to have numbness and tingling/burning/fatigue like feeling in his lower extremities especially his feet.  He does not want to go through the [place] and would rather [place] make an appointment for him at the specialist to be seen for his foot pain.  Review of Systems See HPI   Past Medical History:  Diagnosis Date   Bilateral pulmonary embolism (HCC)    lovenox & coumadin thearpy   Chronic cough    DM (diabetes mellitus) (HCC)    DVT (deep venous thrombosis) (HCC)    left lower   Gastrointestinal bleed    prior   H/O cardiovascular stress test 05/07/2011   normal study, low risk scan   H/O Doppler ultrasound 2013   venous duplex doppler   H/O Doppler ultrasound 2012   venous duplex doppler   H/O echocardiogram 03/14/2011   EF 65-70%   H/O exercise stress test 2006   neg bruce protocol excercise stress test   Hiatal hernia    Hypertension    Prostate cancer [facility])    s/p radiation    Social History   Socioeconomic  History   Marital status: Married    Spouse name: Not on file   Number of children: 3   Years of education: Not on file   Highest education level: Not on file  Occupational History   Not on file  Tobacco Use   Smoking status: Former    Current packs/day: 0.75    Average packs/day: 0.8 packs/day for 20.0 years (15.0 ttl pk-yrs)    Types: Cigarettes    Passive exposure: Past   Smokeless tobacco: Never  Vaping Use   Vaping status: Never Used  Substance and Sexual Activity   Alcohol use: No   Drug use: No   Sexual activity: Not on file  Other Topics Concern   Not on file  Social History Narrative   Not on file   Social Determinants of Health   Financial Resource Strain: Low Risk  (12/20/2022)   Overall Financial Resource Strain (CARDIA)    Difficulty of Paying Living Expenses: Not hard at all  Food Insecurity: No Food Insecurity (12/20/2022)   Hunger Vital Sign    Worried About Running Out of Food in the Last Year: Never true    Ran Out of Food in the Last Year: Never true  Transportation Needs: No Transportation Needs (12/20/2022)   PRAPARE - Administrator, Civil Service (Medical): No    Lack of Transportation (Non-Medical): No  Physical Activity:  Insufficiently Active (12/20/2022)   Exercise Vital Sign    Days of Exercise per Week: 5 days    Minutes of Exercise per Session: 20 min  Stress: No Stress Concern Present (12/20/2022)   [employer] of Occupational Health - Occupational Stress Questionnaire    Feeling of Stress : Not at all  Social Connections: Moderately Integrated (12/20/2022)   Social Connection and Isolation Panel [NHANES]    Frequency of Communication with Friends and Family: More than three times a week    Frequency of Social Gatherings with Friends and Family: More than three times a week    Attends Religious Services: More than 4 times per year    Active Member of [employer] or Organizations: Yes    Attends Banker Meetings: More  than 4 times per year    Marital Status: Widowed  Intimate Partner Violence: Not At Risk (12/20/2022)   Humiliation, Afraid, Rape, and Kick questionnaire    Fear of Current or Ex-Partner: No    Emotionally Abused: No    Physically Abused: No    Sexually Abused: No    Past Surgical History:  Procedure Laterality Date   AMPUTATION Left 03/21/2022   Procedure: LEFT RING FINGER AND LEFT SMALL FINGER REVISION AMPUTATION;  Surgeon: Betha Loa, MD;  Location: [facility] OR;  Service: Orthopedics;  Laterality: Left;   event monitor     2012   HERNIA REPAIR Bilateral    1991, 1992   HIATAL HERNIA REPAIR     1997   IVC FILTER INSERTION     PROSTATE SURGERY      Family History  Problem Relation Age of Onset   Stroke Mother    Kidney disease Father    Heart disease Brother    Arthritis Brother    Heart disease Brother    Prostate cancer Brother     Allergies  Allergen Reactions   Lidocaine     ANTI ITCH CREAM, NO SPECIFICATIONS IN RECORDS.   Lisinopril     cough   Metformin Diarrhea    Current Outpatient Medications on File Prior to Visit  Medication Sig Dispense Refill   Accu-Chek Softclix Lancets lancets 1 Stick by route daily. 400 each 3   acetaminophen (TYLENOL) 500 MG tablet Take by mouth as needed.     amLODipine (NORVASC) 10 MG tablet Take 1 tablet (10 mg total) by mouth daily. 90 tablet 0   apixaban (ELIQUIS) 5 MG TABS tablet Take 5 mg by mouth 2 (two) times daily.     Ascorbic Acid (VITAMIN C) 1000 MG tablet Take 1 tablet by mouth daily.     blood glucose meter kit and supplies KIT Dispense based on patient and insurance preference. Use up to four times daily as directed. 1 each 0   Blood Glucose Monitoring Suppl (ONE TOUCH ULTRA 2) w/Device KIT USE TO TEST BLOOD GLUCOSE TWICE DAILY 1 each 0   Carboxymethylcellulose Sod PF (THERATEARS PF) 0.25 % SOLN INSTILL 1 DROP IN BOTH EYES FOUR TIMES A DAY AS NEEDED     Cholecalciferol 50 MCG (2000 UT) CAPS 1 capsule Orally Once a day      clotrimazole-betamethasone (LOTRISONE) cream Apply 1 application. topically daily. Thin layer twice a day 45 g 2   fluticasone (FLONASE) 50 MCG/ACT nasal spray Place into the nose as needed.     glucose blood (ACCU-CHEK GUIDE) test strip 1 each by Other route 4 (four) times daily. 300 strip 3   Insulin Pen Needle (PEN  NEEDLES) 31G X 6 MM MISC 1 Needle by Does not apply route daily. 300 each 0   LANTUS SOLOSTAR 100 UNIT/ML Solostar Pen Inject 10 Units into the skin daily. 15 mL 0   losartan (COZAAR) 100 MG tablet Take 1 tablet (100 mg total) by mouth daily. 90 tablet 0   omeprazole (PRILOSEC) 20 MG capsule 1 capsule Orally twice a day 90 capsule 0   ondansetron (ZOFRAN) 4 MG tablet Take 1 tablet (4 mg total) by mouth every 8 (eight) hours as needed for nausea or vomiting. 20 tablet 0   polyethylene glycol (MIRALAX / GLYCOLAX) 17 g packet TAKE 17 GRAMS BY MOUTH DAILY     potassium chloride (MICRO-K) 10 MEQ CR capsule Take 1 capsule by mouth daily. SCHEDULE OFFICE VISIT FOR FUTURE REFILLS 30 capsule 0   torsemide (DEMADEX) 5 MG tablet Take 1 tablet (5 mg total) by mouth daily. 90 tablet 0   No current facility-administered medications on file prior to visit.    BP (!) 162/80   Pulse (!) 53   Temp 97.9 F (36.6 C) (Oral)   Ht 5\' 6"  (1.676 m)   Wt 187 lb (84.8 kg)   SpO2 96%   BMI 30.18 kg/m       Objective:   Physical Exam Vitals and nursing note reviewed.  Constitutional:      Appearance: Normal appearance.  Cardiovascular:     Rate and Rhythm: Normal rate and regular rhythm.     Pulses: Normal pulses.     Heart sounds: Normal heart sounds.  Pulmonary:     Effort: Pulmonary effort is normal.     Breath sounds: Normal breath sounds.  Musculoskeletal:        General: Normal range of motion.     Right lower leg: Edema present.     Left lower leg: Edema present.  Skin:    General: Skin is dry.     Capillary Refill: Capillary refill takes less than 2 seconds.  Neurological:      General: No focal deficit present.     Mental Status: He is alert and oriented to person, place, and time.  Psychiatric:        Mood and Affect: Mood normal.        Behavior: Behavior normal.        Thought Content: Thought content normal.        Judgment: Judgment normal.        Assessment & Plan:  1. Bilateral foot pain - There is concern for peripheral artery disease.  Will refer to vein and vascular for further evaluation.  Will have him start wearing compression socks to help with edema.  He can use Voltaren gel to help with foot discomfort.  Try and stay off his feet and elevate when resting  - Ambulatory referral to Vascular Surgery  Shirline Frees, NP

## 2023-04-24 ENCOUNTER — Other Ambulatory Visit: Payer: Self-pay | Admitting: *Deleted

## 2023-04-24 ENCOUNTER — Ambulatory Visit (HOSPITAL_COMMUNITY)
Admission: RE | Admit: 2023-04-24 | Discharge: 2023-04-24 | Disposition: A | Discharge status: Home / Self Care | Payer: Medicare PPO | Source: Ambulatory Visit | Attending: Vascular Surgery | Admitting: Vascular Surgery

## 2023-04-24 DIAGNOSIS — M79604 Pain in right leg: Secondary | ICD-10-CM | POA: Diagnosis not present

## 2023-04-24 DIAGNOSIS — M79605 Pain in left leg: Secondary | ICD-10-CM | POA: Diagnosis not present

## 2023-04-24 LAB — VAS US ABI WITH/WO TBI
Left ABI: 1.3
Right ABI: 1.28

## 2023-05-05 NOTE — Progress Notes (Unsigned)
[facility] [place]  ASSESSMENT / PLAN: Dakota Lewis is a 89 y.o. male with neuropathic type discomfort in his lower extremities.  His physical exam and noninvasive testing are both normal.  No evidence of hemodynamically significant peripheral arterial disease.  He can follow-up with me on an as-needed basis.  CHIEF COMPLAINT: Pain in bilateral feet  HISTORY OF PRESENT ILLNESS: Dakota Lewis is a 89 y.o. male who presents to clinic for evaluation of bilateral foot pain.  Patient reports a burning/tingling discomfort in the soles of the feet bilaterally.  He reports some sensory change as well.  He does not have claudication type discomfort.  Pain is not typical of rest pain.  He has no ulcers about his feet.  Past Medical History:  Diagnosis Date   Bilateral pulmonary embolism (HCC)    lovenox & coumadin thearpy   Chronic cough    DM (diabetes mellitus) (HCC)    DVT (deep venous thrombosis) (HCC)    left lower   Gastrointestinal bleed    prior   H/O cardiovascular stress test 05/07/2011   normal study, low risk scan   H/O Doppler ultrasound 2013   venous duplex doppler   H/O Doppler ultrasound 2012   venous duplex doppler   H/O echocardiogram 03/14/2011   EF 65-70%   H/O exercise stress test 2006   neg bruce protocol excercise stress test   Hiatal hernia    Hypertension    Prostate cancer [facility])    s/p radiation    Past Surgical History:  Procedure Laterality Date   AMPUTATION Left 03/21/2022   Procedure: LEFT RING FINGER AND LEFT SMALL FINGER REVISION AMPUTATION;  Surgeon: Betha Loa, MD;  Location: [facility] OR;  Service: Orthopedics;  Laterality: Left;   event monitor     2012   HERNIA REPAIR Bilateral    1991, 1992   HIATAL HERNIA REPAIR     1997   IVC FILTER INSERTION     PROSTATE SURGERY      Family History  Problem Relation Age of Onset   Stroke Mother    Kidney disease Father    Heart disease Brother    Arthritis Brother    Heart disease  Brother    Prostate cancer Brother     Social History   Socioeconomic History   Marital status: Married    Spouse name: Not on file   Number of children: 3   Years of education: Not on file   Highest education level: Not on file  Occupational History   Not on file  Tobacco Use   Smoking status: Former    Current packs/day: 0.75    Average packs/day: 0.8 packs/day for 20.0 years (15.0 ttl pk-yrs)    Types: Cigarettes    Passive exposure: Past   Smokeless tobacco: Never  Vaping Use   Vaping status: Never Used  Substance and Sexual Activity   Alcohol use: No   Drug use: No   Sexual activity: Not on file  Other Topics Concern   Not on file  Social History Narrative   Not on file   Social Determinants of Health   Financial Resource Strain: Low Risk  (12/20/2022)   Overall Financial Resource Strain (CARDIA)    Difficulty of Paying Living Expenses: Not hard at all  Food Insecurity: No Food Insecurity (12/20/2022)   Hunger Vital Sign    Worried About Running Out of Food in the Last Year: Never true    Ran  Out of Food in the Last Year: Never true  Transportation Needs: No Transportation Needs (12/20/2022)   PRAPARE - Administrator, Civil Service (Medical): No    Lack of Transportation (Non-Medical): No  Physical Activity: Insufficiently Active (12/20/2022)   Exercise Vital Sign    Days of Exercise per Week: 5 days    Minutes of Exercise per Session: 20 min  Stress: No Stress Concern Present (12/20/2022)   [employer] of Occupational Health - Occupational Stress Questionnaire    Feeling of Stress : Not at all  Social Connections: Moderately Integrated (12/20/2022)   Social Connection and Isolation Panel [NHANES]    Frequency of Communication with Friends and Family: More than three times a week    Frequency of Social Gatherings with Friends and Family: More than three times a week    Attends Religious Services: More than 4 times per year    Active Member of  [employer] or Organizations: Yes    Attends Banker Meetings: More than 4 times per year    Marital Status: Widowed  Intimate Partner Violence: Not At Risk (12/20/2022)   Humiliation, Afraid, Rape, and Kick questionnaire    Fear of Current or Ex-Partner: No    Emotionally Abused: No    Physically Abused: No    Sexually Abused: No    Allergies  Allergen Reactions   Lidocaine     ANTI ITCH CREAM, NO SPECIFICATIONS IN RECORDS.   Lisinopril     cough   Metformin Diarrhea    Current Outpatient Medications  Medication Sig Dispense Refill   Accu-Chek Softclix Lancets lancets 1 Stick by route daily. 400 each 3   acetaminophen (TYLENOL) 500 MG tablet Take by mouth as needed.     amLODipine (NORVASC) 10 MG tablet Take 1 tablet (10 mg total) by mouth daily. 90 tablet 0   apixaban (ELIQUIS) 5 MG TABS tablet Take 5 mg by mouth 2 (two) times daily.     Ascorbic Acid (VITAMIN C) 1000 MG tablet Take 1 tablet by mouth daily.     blood glucose meter kit and supplies KIT Dispense based on patient and insurance preference. Use up to four times daily as directed. 1 each 0   Blood Glucose Monitoring Suppl (ONE TOUCH ULTRA 2) w/Device KIT USE TO TEST BLOOD GLUCOSE TWICE DAILY 1 each 0   Carboxymethylcellulose Sod PF (THERATEARS PF) 0.25 % SOLN INSTILL 1 DROP IN BOTH EYES FOUR TIMES A DAY AS NEEDED     Cholecalciferol 50 MCG (2000 UT) CAPS 1 capsule Orally Once a day     clotrimazole-betamethasone (LOTRISONE) cream Apply 1 application. topically daily. Thin layer twice a day 45 g 2   fluticasone (FLONASE) 50 MCG/ACT nasal spray Place into the nose as needed.     glucose blood (ACCU-CHEK GUIDE) test strip 1 each by Other route 4 (four) times daily. 300 strip 3   Insulin Pen Needle (PEN NEEDLES) 31G X 6 MM MISC 1 Needle by Does not apply route daily. 300 each 0   LANTUS SOLOSTAR 100 UNIT/ML Solostar Pen Inject 10 Units into the skin daily. 15 mL 0   losartan (COZAAR) 100 MG tablet Take 1 tablet (100  mg total) by mouth daily. 90 tablet 0   omeprazole (PRILOSEC) 20 MG capsule 1 capsule Orally twice a day 90 capsule 0   ondansetron (ZOFRAN) 4 MG tablet Take 1 tablet (4 mg total) by mouth every 8 (eight) hours as needed for nausea or  vomiting. 20 tablet 0   polyethylene glycol (MIRALAX / GLYCOLAX) 17 g packet TAKE 17 GRAMS BY MOUTH DAILY     potassium chloride (MICRO-K) 10 MEQ CR capsule Take 1 capsule by mouth daily. SCHEDULE OFFICE VISIT FOR FUTURE REFILLS 30 capsule 0   torsemide (DEMADEX) 5 MG tablet Take 1 tablet (5 mg total) by mouth daily. 90 tablet 0   No current facility-administered medications for this visit.    PHYSICAL EXAM Vitals:   05/06/23 1326  BP: (!) 162/76  Pulse: 67  Resp: 20  Temp: 98.5 F (36.9 C)  SpO2: 95%  Weight: 191 lb (86.6 kg)  Height: 5\' 6"  (1.676 m)    Elderly man in no distress Regular rate and rhythm Unlabored breathing Palpable dorsalis pedis pulses bilaterally Spotty sensory loss over the feet to light touch    PERTINENT LABORATORY AND RADIOLOGIC DATA  Most recent CBC    Latest Ref Rng & Units 09/18/2022    4:07 PM 08/28/2022    9:56 AM 03/21/2022    8:22 PM  CBC  WBC 4.0 - 10.5 K/uL 7.8  6.0  8.0   Hemoglobin 13.0 - 17.0 g/dL 16.6  06.3  01.6   Hematocrit 39.0 - 52.0 % 43.7  39.1  42.5   Platelets 150.0 - 400.0 K/uL 317.0  213.0  169      Most recent CMP    Latest Ref Rng & Units 09/18/2022    4:07 PM 08/28/2022    9:56 AM 08/16/2022    9:14 AM  CMP  Glucose 70 - 99 mg/dL 010  932  355   BUN 6 - 23 mg/dL 28  19  28    Creatinine 0.40 - 1.50 mg/dL 7.32  2.02  5.42   Sodium 135 - 145 mEq/L 134  136  133   Potassium 3.5 - 5.1 mEq/L 4.7  4.0  4.2   Chloride 96 - 112 mEq/L 103  103  99   CO2 19 - 32 mEq/L 24  26  27    Calcium 8.4 - 10.5 mg/dL 70.6  9.8  9.7   Total Protein 6.0 - 8.3 g/dL 7.9  7.2    Total Bilirubin 0.2 - 1.2 mg/dL 0.8  0.6    Alkaline Phos 39 - 117 U/L 69  60    AST 0 - 37 U/L 32  29    ALT 0 - 53 U/L 43   32      Renal function CrCl cannot be calculated (Patient's most recent lab result is older than the maximum 21 days allowed.).  Hemoglobin A1C  Date Value  11/26/2022 6.2 % (A)  06/16/2020 6.8   Hgb A1c MFr Bld (%)  Date Value  07/12/2021 7.2 (H)    LDL Cholesterol  Date Value Ref Range Status  08/28/2022 56 0 - 99 mg/dL Final   Direct LDL  Date Value Ref Range Status  07/07/2019 100.0 mg/dL Final    Comment:    Optimal:  <100 mg/dLNear or Above Optimal:  100-129 mg/dLBorderline High:  130-159 mg/dLHigh:  160-189 mg/dLVery High:  >190 mg/dL     +-------+-----------+-----------+------------+------------+  ABI/TBIToday's ABIToday's TBIPrevious ABIPrevious TBI  +-------+-----------+-----------+------------+------------+  Right 1.28       0.80                                 +-------+-----------+-----------+------------+------------+  Left  1.30       0.91                                 +-------+-----------+-----------+------------+------------+  Rande Brunt. Lenell Antu, MD FACS [facility] Phone Number: [phone] 05/05/2023 4:46 PM   Total time spent on preparing this encounter including chart review, data review, collecting history, examining the patient, coordinating care for this new patient, 45 minutes.  Portions of this report may have been transcribed using voice recognition software.  Every effort has been made to ensure accuracy; however, inadvertent computerized transcription errors may still be present.

## 2023-05-06 ENCOUNTER — Ambulatory Visit: Payer: Medicare PPO | Admitting: Vascular Surgery

## 2023-05-06 ENCOUNTER — Encounter: Payer: Self-pay | Admitting: Vascular Surgery

## 2023-05-06 VITALS — BP 162/76 | HR 67 | Temp 98.5°F | Resp 20 | Ht 66.0 in | Wt 191.0 lb

## 2023-05-06 DIAGNOSIS — G629 Polyneuropathy, unspecified: Secondary | ICD-10-CM | POA: Diagnosis not present

## 2023-05-07 ENCOUNTER — Encounter (INDEPENDENT_AMBULATORY_CARE_PROVIDER_SITE_OTHER): Payer: Self-pay

## 2023-05-08 ENCOUNTER — Telehealth: Payer: Medicare HMO

## 2023-05-09 ENCOUNTER — Telehealth: Payer: Medicare HMO

## 2023-05-23 ENCOUNTER — Ambulatory Visit: Payer: Medicare PPO | Admitting: Adult Health

## 2023-05-23 VITALS — BP 170/68 | HR 48 | Temp 97.7°F | Ht 66.0 in | Wt 186.4 lb

## 2023-05-23 DIAGNOSIS — I10 Essential (primary) hypertension: Secondary | ICD-10-CM | POA: Diagnosis not present

## 2023-05-23 DIAGNOSIS — E1165 Type 2 diabetes mellitus with hyperglycemia: Secondary | ICD-10-CM

## 2023-05-23 DIAGNOSIS — Z794 Long term (current) use of insulin: Secondary | ICD-10-CM

## 2023-05-23 LAB — POCT GLYCOSYLATED HEMOGLOBIN (HGB A1C): Hemoglobin A1C: 6.7 % — AB (ref 4.0–5.6)

## 2023-05-23 NOTE — Progress Notes (Signed)
Subjective:    Patient ID: Dakota Lewis, male    DOB: 06-30-1933, 89 y.o.   MRN: [id number]  HPI  89 year old male who  has a past medical history of Bilateral pulmonary embolism (HCC), Chronic cough, DM (diabetes mellitus) (HCC), DVT (deep venous thrombosis) (HCC), Gastrointestinal bleed, H/O cardiovascular stress test (05/07/2011), H/O Doppler ultrasound (2013), H/O Doppler ultrasound (2012), H/O echocardiogram (03/14/2011), H/O exercise stress test (2006), Hiatal hernia, Hypertension, and Prostate cancer (HCC).  He presents to the office today for follow up regarding DM and HTN   DM type 2 - managed with lantus 5 units daily. He does monitor his blood sugar at home with readings in the 90's - 150's with most being in the 120-140's. He denies hypoglycemic episodes. He continues to try and eat healthy and stays away from carbs and sugars.  Lab Results  Component Value Date   HGBA1C 6.2 (A) 11/26/2022   HTN - managed with losartan 100 mg daily. He denies myalgia or fatigue. He did not take his medication this morning.  He checks his blood pressure at home periodically " not often"  when he does check it is in the 130-150's.  BP Readings from Last 3 Encounters:  05/23/23 (!) 170/68  05/06/23 (!) 162/76  04/22/23 (!) 162/80   Review of Systems See HPI   Past Medical History:  Diagnosis Date   Bilateral pulmonary embolism (HCC)    lovenox & coumadin thearpy   Chronic cough    DM (diabetes mellitus) (HCC)    DVT (deep venous thrombosis) (HCC)    left lower   Gastrointestinal bleed    prior   H/O cardiovascular stress test 05/07/2011   normal study, low risk scan   H/O Doppler ultrasound 2013   venous duplex doppler   H/O Doppler ultrasound 2012   venous duplex doppler   H/O echocardiogram 03/14/2011   EF 65-70%   H/O exercise stress test 2006   neg bruce protocol excercise stress test   Hiatal hernia    Hypertension    Prostate cancer [facility])    s/p radiation    Social  History   Socioeconomic History   Marital status: Married    Spouse name: Not on file   Number of children: 3   Years of education: Not on file   Highest education level: Not on file  Occupational History   Not on file  Tobacco Use   Smoking status: Former    Current packs/day: 0.75    Average packs/day: 0.8 packs/day for 20.0 years (15.0 ttl pk-yrs)    Types: Cigarettes    Passive exposure: Past   Smokeless tobacco: Never  Vaping Use   Vaping status: Never Used  Substance and Sexual Activity   Alcohol use: No   Drug use: No   Sexual activity: Not on file  Other Topics Concern   Not on file  Social History Narrative   Not on file   Social Determinants of Health   Financial Resource Strain: Low Risk  (12/20/2022)   Overall Financial Resource Strain (CARDIA)    Difficulty of Paying Living Expenses: Not hard at all  Food Insecurity: No Food Insecurity (12/20/2022)   Hunger Vital Sign    Worried About Running Out of Food in the Last Year: Never true    Ran Out of Food in the Last Year: Never true  Transportation Needs: No Transportation Needs (12/20/2022)   PRAPARE - Transportation    Lack of Transportation (  Medical): No    Lack of Transportation (Non-Medical): No  Physical Activity: Insufficiently Active (12/20/2022)   Exercise Vital Sign    Days of Exercise per Week: 5 days    Minutes of Exercise per Session: 20 min  Stress: No Stress Concern Present (12/20/2022)   [employer] of Occupational Health - Occupational Stress Questionnaire    Feeling of Stress : Not at all  Social Connections: Moderately Integrated (12/20/2022)   Social Connection and Isolation Panel [NHANES]    Frequency of Communication with Friends and Family: More than three times a week    Frequency of Social Gatherings with Friends and Family: More than three times a week    Attends Religious Services: More than 4 times per year    Active Member of [employer] or Organizations: Yes    Attends Tax inspector Meetings: More than 4 times per year    Marital Status: Widowed  Intimate Partner Violence: Not At Risk (12/20/2022)   Humiliation, Afraid, Rape, and Kick questionnaire    Fear of Current or Ex-Partner: No    Emotionally Abused: No    Physically Abused: No    Sexually Abused: No    Past Surgical History:  Procedure Laterality Date   AMPUTATION Left 03/21/2022   Procedure: LEFT RING FINGER AND LEFT SMALL FINGER REVISION AMPUTATION;  Surgeon: Betha Loa, MD;  Location: [facility] OR;  Service: Orthopedics;  Laterality: Left;   event monitor     2012   HERNIA REPAIR Bilateral    1991, 1992   HIATAL HERNIA REPAIR     1997   IVC FILTER INSERTION     PROSTATE SURGERY      Family History  Problem Relation Age of Onset   Stroke Mother    Kidney disease Father    Heart disease Brother    Arthritis Brother    Heart disease Brother    Prostate cancer Brother     Allergies  Allergen Reactions   Lidocaine     ANTI ITCH CREAM, NO SPECIFICATIONS IN RECORDS.   Lisinopril     cough   Metformin Diarrhea    Current Outpatient Medications on File Prior to Visit  Medication Sig Dispense Refill   Accu-Chek Softclix Lancets lancets 1 Stick by route daily. 400 each 3   acetaminophen (TYLENOL) 500 MG tablet Take by mouth as needed.     amLODipine (NORVASC) 10 MG tablet Take 1 tablet (10 mg total) by mouth daily. 90 tablet 0   apixaban (ELIQUIS) 5 MG TABS tablet Take 5 mg by mouth 2 (two) times daily.     blood glucose meter kit and supplies KIT Dispense based on patient and insurance preference. Use up to four times daily as directed. 1 each 0   Blood Glucose Monitoring Suppl (ONE TOUCH ULTRA 2) w/Device KIT USE TO TEST BLOOD GLUCOSE TWICE DAILY 1 each 0   Carboxymethylcellulose Sod PF (THERATEARS PF) 0.25 % SOLN INSTILL 1 DROP IN BOTH EYES FOUR TIMES A DAY AS NEEDED     Cholecalciferol 50 MCG (2000 UT) CAPS 1 capsule Orally Once a day     clotrimazole-betamethasone (LOTRISONE) cream  Apply 1 application. topically daily. Thin layer twice a day 45 g 2   glucose blood (ACCU-CHEK GUIDE) test strip 1 each by Other route 4 (four) times daily. 300 strip 3   Insulin Pen Needle (PEN NEEDLES) 31G X 6 MM MISC 1 Needle by Does not apply route daily. 300 each 0   LANTUS  SOLOSTAR 100 UNIT/ML Solostar Pen Inject 10 Units into the skin daily. (Patient taking differently: Inject 5 Units into the skin daily.) 15 mL 0   losartan (COZAAR) 100 MG tablet Take 1 tablet (100 mg total) by mouth daily. 90 tablet 0   omeprazole (PRILOSEC) 20 MG capsule 1 capsule Orally twice a day 90 capsule 0   polyethylene glycol (MIRALAX / GLYCOLAX) 17 g packet TAKE 17 GRAMS BY MOUTH DAILY     potassium chloride (MICRO-K) 10 MEQ CR capsule Take 1 capsule by mouth daily. SCHEDULE OFFICE VISIT FOR FUTURE REFILLS 30 capsule 0   torsemide (DEMADEX) 5 MG tablet Take 1 tablet (5 mg total) by mouth daily. 90 tablet 0   Ascorbic Acid (VITAMIN C) 1000 MG tablet Take 1 tablet by mouth daily. (Patient not taking: Reported on 05/23/2023)     fluticasone (FLONASE) 50 MCG/ACT nasal spray Place into the nose as needed. (Patient not taking: Reported on 05/23/2023)     No current facility-administered medications on file prior to visit.    BP (!) 170/68 (BP Location: Left Arm, Patient Position: Sitting, Cuff Size: Large) Comment: did not take his BP med this am  Pulse (!) 48   Temp 97.7 F (36.5 C) (Oral)   Ht 5\' 6"  (1.676 m)   Wt 186 lb 6.4 oz (84.6 kg)   SpO2 96%   BMI 30.09 kg/m       Objective:   Physical Exam Vitals and nursing note reviewed.  Constitutional:      Appearance: Normal appearance. He is obese.  Cardiovascular:     Rate and Rhythm: Normal rate and regular rhythm.     Pulses: Normal pulses.     Heart sounds: Normal heart sounds.  Pulmonary:     Effort: Pulmonary effort is normal.     Breath sounds: Normal breath sounds.  Skin:    General: Skin is warm and dry.  Neurological:     General: No  focal deficit present.     Mental Status: He is alert and oriented to person, place, and time.  Psychiatric:        Mood and Affect: Mood normal.        Behavior: Behavior normal.        Thought Content: Thought content normal.        Judgment: Judgment normal.       Assessment & Plan:  1. Type 2 diabetes mellitus with hyperglycemia, with long-term current use of insulin (HCC)  - POC HgB A1c- 6.7  - No change. Continue with Lantus 5 units   2. Essential hypertension - Not at goal today due to not taking medication  - I am going to have him monitor his BP three times a week at home and will follow up at CPE   Shirline Frees, NP

## 2023-05-26 DIAGNOSIS — C4442 Squamous cell carcinoma of skin of scalp and neck: Secondary | ICD-10-CM | POA: Diagnosis not present

## 2023-05-26 DIAGNOSIS — C44222 Squamous cell carcinoma of skin of right ear and external auricular canal: Secondary | ICD-10-CM | POA: Diagnosis not present

## 2023-05-27 ENCOUNTER — Other Ambulatory Visit: Payer: Self-pay | Admitting: Adult Health

## 2023-05-27 ENCOUNTER — Ambulatory Visit: Payer: Medicare PPO | Admitting: Adult Health

## 2023-05-27 DIAGNOSIS — I10 Essential (primary) hypertension: Secondary | ICD-10-CM

## 2023-06-05 DIAGNOSIS — D485 Neoplasm of uncertain behavior of skin: Secondary | ICD-10-CM | POA: Diagnosis not present

## 2023-06-05 DIAGNOSIS — L720 Epidermal cyst: Secondary | ICD-10-CM | POA: Diagnosis not present

## 2023-06-05 DIAGNOSIS — C44329 Squamous cell carcinoma of skin of other parts of face: Secondary | ICD-10-CM | POA: Diagnosis not present

## 2023-06-05 DIAGNOSIS — D0439 Carcinoma in situ of skin of other parts of face: Secondary | ICD-10-CM | POA: Diagnosis not present

## 2023-06-05 DIAGNOSIS — L821 Other seborrheic keratosis: Secondary | ICD-10-CM | POA: Diagnosis not present

## 2023-07-28 ENCOUNTER — Other Ambulatory Visit: Payer: Self-pay | Admitting: Adult Health

## 2023-07-28 DIAGNOSIS — E0865 Diabetes mellitus due to underlying condition with hyperglycemia: Secondary | ICD-10-CM

## 2023-07-28 DIAGNOSIS — I10 Essential (primary) hypertension: Secondary | ICD-10-CM

## 2023-08-13 DIAGNOSIS — R059 Cough, unspecified: Secondary | ICD-10-CM | POA: Diagnosis not present

## 2023-08-13 DIAGNOSIS — J988 Other specified respiratory disorders: Secondary | ICD-10-CM | POA: Diagnosis not present

## 2023-08-13 DIAGNOSIS — R0989 Other specified symptoms and signs involving the circulatory and respiratory systems: Secondary | ICD-10-CM | POA: Diagnosis not present

## 2023-08-13 DIAGNOSIS — B9789 Other viral agents as the cause of diseases classified elsewhere: Secondary | ICD-10-CM | POA: Diagnosis not present

## 2023-08-13 DIAGNOSIS — Z03818 Encounter for observation for suspected exposure to other biological agents ruled out: Secondary | ICD-10-CM | POA: Diagnosis not present

## 2023-08-13 DIAGNOSIS — U071 COVID-19: Secondary | ICD-10-CM | POA: Diagnosis not present

## 2023-08-21 ENCOUNTER — Ambulatory Visit: Payer: Medicare PPO | Admitting: Adult Health

## 2023-08-21 ENCOUNTER — Other Ambulatory Visit: Payer: Self-pay | Admitting: Adult Health

## 2023-08-21 ENCOUNTER — Encounter: Payer: Self-pay | Admitting: Adult Health

## 2023-08-21 VITALS — BP 142/80 | HR 75 | Temp 97.8°F | Ht 66.0 in | Wt 188.0 lb

## 2023-08-21 DIAGNOSIS — I10 Essential (primary) hypertension: Secondary | ICD-10-CM

## 2023-08-21 DIAGNOSIS — D225 Melanocytic nevi of trunk: Secondary | ICD-10-CM | POA: Diagnosis not present

## 2023-08-21 DIAGNOSIS — D044 Carcinoma in situ of skin of scalp and neck: Secondary | ICD-10-CM | POA: Diagnosis not present

## 2023-08-21 DIAGNOSIS — H6123 Impacted cerumen, bilateral: Secondary | ICD-10-CM | POA: Diagnosis not present

## 2023-08-21 DIAGNOSIS — L820 Inflamed seborrheic keratosis: Secondary | ICD-10-CM | POA: Diagnosis not present

## 2023-08-21 DIAGNOSIS — L570 Actinic keratosis: Secondary | ICD-10-CM | POA: Diagnosis not present

## 2023-08-21 DIAGNOSIS — L821 Other seborrheic keratosis: Secondary | ICD-10-CM | POA: Diagnosis not present

## 2023-08-21 DIAGNOSIS — L814 Other melanin hyperpigmentation: Secondary | ICD-10-CM | POA: Diagnosis not present

## 2023-08-21 DIAGNOSIS — J988 Other specified respiratory disorders: Secondary | ICD-10-CM

## 2023-08-21 DIAGNOSIS — L578 Other skin changes due to chronic exposure to nonionizing radiation: Secondary | ICD-10-CM | POA: Diagnosis not present

## 2023-08-21 DIAGNOSIS — Z85828 Personal history of other malignant neoplasm of skin: Secondary | ICD-10-CM | POA: Diagnosis not present

## 2023-08-21 DIAGNOSIS — D485 Neoplasm of uncertain behavior of skin: Secondary | ICD-10-CM | POA: Diagnosis not present

## 2023-08-21 MED ORDER — DOXYCYCLINE HYCLATE 100 MG PO CAPS: 100.0000 mg | ORAL_CAPSULE | Freq: Two times a day (BID) | ORAL | 0 refills | Status: DC

## 2023-08-21 NOTE — Progress Notes (Signed)
Subjective:    Patient ID: Dakota Lewis, male    DOB: [birth year], 89 y.o.   MRN: [id number]  HPI  89 year old male who  has a past medical history of Bilateral pulmonary embolism (HCC), Chronic cough, DM (diabetes mellitus) (HCC), DVT (deep venous thrombosis) (HCC), Gastrointestinal bleed, H/O cardiovascular stress test (05/07/2011), H/O Doppler ultrasound (2013), H/O Doppler ultrasound (2012), H/O echocardiogram (03/14/2011), H/O exercise stress test (2006), Hiatal hernia, Hypertension, and Prostate cancer (HCC).  He presents to the office today for bilateral cerumen impaction. His ears feel full. He does endorse hearing loss.   He also reports that over the last 11 days he has been having a productive harsh cough. He had had a low grade fever up to 100.1.  ? Mild shortness of breath? Does not have any sinus pain or pressure.    Review of Systems See HPI   Past Medical History:  Diagnosis Date   Bilateral pulmonary embolism (HCC)    lovenox & coumadin thearpy   Chronic cough    DM (diabetes mellitus) (HCC)    DVT (deep venous thrombosis) (HCC)    left lower   Gastrointestinal bleed    prior   H/O cardiovascular stress test 05/07/2011   normal study, low risk scan   H/O Doppler ultrasound 2013   venous duplex doppler   H/O Doppler ultrasound 2012   venous duplex doppler   H/O echocardiogram 03/14/2011   EF 65-70%   H/O exercise stress test 2006   neg bruce protocol excercise stress test   Hiatal hernia    Hypertension    Prostate cancer [facility])    s/p radiation    Social History   Socioeconomic History   Marital status: Married    Spouse name: Not on file   Number of children: 3   Years of education: Not on file   Highest education level: Not on file  Occupational History   Not on file  Tobacco Use   Smoking status: Former    Current packs/day: 0.75    Average packs/day: 0.8 packs/day for 20.0 years (15.0 ttl pk-yrs)    Types: Cigarettes    Passive exposure: Past    Smokeless tobacco: Never  Vaping Use   Vaping status: Never Used  Substance and Sexual Activity   Alcohol use: No   Drug use: No   Sexual activity: Not on file  Other Topics Concern   Not on file  Social History Narrative   Not on file   Social Determinants of Health   Financial Resource Strain: Low Risk  (12/20/2022)   Overall Financial Resource Strain (CARDIA)    Difficulty of Paying Living Expenses: Not hard at all  Food Insecurity: No Food Insecurity (12/20/2022)   Hunger Vital Sign    Worried About Running Out of Food in the Last Year: Never true    Ran Out of Food in the Last Year: Never true  Transportation Needs: No Transportation Needs (12/20/2022)   PRAPARE - Administrator, Civil Service (Medical): No    Lack of Transportation (Non-Medical): No  Physical Activity: Insufficiently Active (12/20/2022)   Exercise Vital Sign    Days of Exercise per Week: 5 days    Minutes of Exercise per Session: 20 min  Stress: No Stress Concern Present (12/20/2022)   [employer] of Occupational Health - Occupational Stress Questionnaire    Feeling of Stress : Not at all  Social Connections: Moderately Integrated (12/20/2022)   Social  Connection and Isolation Panel [NHANES]    Frequency of Communication with Friends and Family: More than three times a week    Frequency of Social Gatherings with Friends and Family: More than three times a week    Attends Religious Services: More than 4 times per year    Active Member of [employer] or Organizations: Yes    Attends Banker Meetings: More than 4 times per year    Marital Status: Widowed  Intimate Partner Violence: Not At Risk (12/20/2022)   Humiliation, Afraid, Rape, and Kick questionnaire    Fear of Current or Ex-Partner: No    Emotionally Abused: No    Physically Abused: No    Sexually Abused: No    Past Surgical History:  Procedure Laterality Date   AMPUTATION Left 03/21/2022   Procedure: LEFT RING FINGER  AND LEFT SMALL FINGER REVISION AMPUTATION;  Surgeon: Betha Loa, MD;  Location: [facility] OR;  Service: Orthopedics;  Laterality: Left;   event monitor     2012   HERNIA REPAIR Bilateral    1991, 1992   HIATAL HERNIA REPAIR     1997   IVC FILTER INSERTION     PROSTATE SURGERY      Family History  Problem Relation Age of Onset   Stroke Mother    Kidney disease Father    Heart disease Brother    Arthritis Brother    Heart disease Brother    Prostate cancer Brother     Allergies  Allergen Reactions   Lidocaine     ANTI ITCH CREAM, NO SPECIFICATIONS IN RECORDS.   Lisinopril     cough   Metformin Diarrhea    Current Outpatient Medications on File Prior to Visit  Medication Sig Dispense Refill   Accu-Chek Softclix Lancets lancets 1 Stick by route daily. 400 each 3   acetaminophen (TYLENOL) 500 MG tablet Take by mouth as needed.     amLODipine (NORVASC) 10 MG tablet Take 1 tablet (10 mg total) by mouth daily. 90 tablet 0   apixaban (ELIQUIS) 5 MG TABS tablet Take 5 mg by mouth 2 (two) times daily.     Ascorbic Acid (VITAMIN C) 1000 MG tablet Take 1 tablet by mouth daily. (Patient not taking: Reported on 05/23/2023)     blood glucose meter kit and supplies KIT Dispense based on patient and insurance preference. Use up to four times daily as directed. 1 each 0   Blood Glucose Monitoring Suppl (ONE TOUCH ULTRA 2) w/Device KIT USE TO TEST BLOOD GLUCOSE TWICE DAILY 1 each 0   Carboxymethylcellulose Sod PF (THERATEARS PF) 0.25 % SOLN INSTILL 1 DROP IN BOTH EYES FOUR TIMES A DAY AS NEEDED     Cholecalciferol 50 MCG (2000 UT) CAPS 1 capsule Orally Once a day     clotrimazole-betamethasone (LOTRISONE) cream Apply 1 application. topically daily. Thin layer twice a day 45 g 2   fluticasone (FLONASE) 50 MCG/ACT nasal spray Place into the nose as needed. (Patient not taking: Reported on 05/23/2023)     glucose blood (ACCU-CHEK GUIDE) test strip 1 each by Other route 4 (four) times daily. 300 strip 3    Insulin Pen Needle (PEN NEEDLES) 31G X 6 MM MISC 1 Needle by Does not apply route daily. 300 each 0   LANTUS SOLOSTAR 100 UNIT/ML Solostar Pen Inject 10 Units into the skin daily. (Patient taking differently: Inject 5 Units into the skin daily.) 15 mL 0   losartan (COZAAR) 100 MG tablet Take  1 tablet (100 mg total) by mouth daily. 90 tablet 0   omeprazole (PRILOSEC) 20 MG capsule Take 1 capsule (20 mg total) by mouth daily. 90 capsule 3   polyethylene glycol (MIRALAX / GLYCOLAX) 17 g packet TAKE 17 GRAMS BY MOUTH DAILY     potassium chloride (MICRO-K) 10 MEQ CR capsule Take 1 capsule by mouth daily. SCHEDULE OFFICE VISIT FOR FUTURE REFILLS 30 capsule 0   torsemide (DEMADEX) 5 MG tablet Take 1 tablet (5 mg total) by mouth daily. 90 tablet 0   No current facility-administered medications on file prior to visit.    There were no vitals taken for this visit.      Objective:   Physical Exam Vitals and nursing note reviewed.  Constitutional:      Appearance: Normal appearance.  HENT:     Right Ear: There is impacted cerumen.     Left Ear: There is impacted cerumen.  Cardiovascular:     Rate and Rhythm: Normal rate and regular rhythm.     Pulses: Normal pulses.     Heart sounds: Normal heart sounds.  Pulmonary:     Effort: Pulmonary effort is normal.     Breath sounds: Wheezing and rhonchi present.  Skin:    General: Skin is warm and dry.  Neurological:     General: No focal deficit present.     Mental Status: He is alert and oriented to person, place, and time.  Psychiatric:        Mood and Affect: Mood normal.        Behavior: Behavior normal.        Thought Content: Thought content normal.        Judgment: Judgment normal.       Assessment & Plan:  1. Bilateral impacted cerumen Warm water was applied and gentle ear lavage performed  bilaterally. There were no complications and following the disimpaction the tympanic membrane were visible bilaterally. Tympanic membranes are  intact following the procedure.  Auditory canals are normal.  The patient reported relief of symptoms after removal of cerumen. Patient tolerated procedure well.    2. Respiratory infection - Will treat and cover for pneumonia  - doxycycline (VIBRAMYCIN) 100 MG capsule; Take 1 capsule (100 mg total) by mouth 2 (two) times daily.  Dispense: 14 capsule; Refill: 0   Shirline Frees, NP

## 2023-09-02 ENCOUNTER — Encounter: Payer: Self-pay | Admitting: Adult Health

## 2023-09-02 ENCOUNTER — Ambulatory Visit (INDEPENDENT_AMBULATORY_CARE_PROVIDER_SITE_OTHER): Payer: Medicare PPO | Admitting: Adult Health

## 2023-09-02 VITALS — BP 170/80 | HR 62 | Temp 98.0°F | Ht 65.0 in | Wt 196.0 lb

## 2023-09-02 DIAGNOSIS — Z8546 Personal history of malignant neoplasm of prostate: Secondary | ICD-10-CM | POA: Diagnosis not present

## 2023-09-02 DIAGNOSIS — I10 Essential (primary) hypertension: Secondary | ICD-10-CM

## 2023-09-02 DIAGNOSIS — K219 Gastro-esophageal reflux disease without esophagitis: Secondary | ICD-10-CM | POA: Diagnosis not present

## 2023-09-02 DIAGNOSIS — E782 Mixed hyperlipidemia: Secondary | ICD-10-CM | POA: Diagnosis not present

## 2023-09-02 DIAGNOSIS — M150 Primary generalized (osteo)arthritis: Secondary | ICD-10-CM

## 2023-09-02 DIAGNOSIS — Z0000 Encounter for general adult medical examination without abnormal findings: Secondary | ICD-10-CM

## 2023-09-02 DIAGNOSIS — K7460 Unspecified cirrhosis of liver: Secondary | ICD-10-CM

## 2023-09-02 DIAGNOSIS — Z23 Encounter for immunization: Secondary | ICD-10-CM

## 2023-09-02 DIAGNOSIS — R350 Frequency of micturition: Secondary | ICD-10-CM | POA: Diagnosis not present

## 2023-09-02 DIAGNOSIS — Z794 Long term (current) use of insulin: Secondary | ICD-10-CM

## 2023-09-02 DIAGNOSIS — E1165 Type 2 diabetes mellitus with hyperglycemia: Secondary | ICD-10-CM

## 2023-09-02 LAB — COMPREHENSIVE METABOLIC PANEL
ALT: 25 U/L (ref 0–53)
AST: 22 U/L (ref 0–37)
Albumin: 4.1 g/dL (ref 3.5–5.2)
Alkaline Phosphatase: 78 U/L (ref 39–117)
BUN: 18 mg/dL (ref 6–23)
CO2: 30 meq/L (ref 19–32)
Calcium: 9.6 mg/dL (ref 8.4–10.5)
Chloride: 103 meq/L (ref 96–112)
Creatinine, Ser: 0.89 mg/dL (ref 0.40–1.50)
GFR: 76.4 mL/min (ref >60.00)
Glucose, Bld: 146 mg/dL — ABNORMAL HIGH (ref 70–99)
Potassium: 3.9 meq/L (ref 3.5–5.1)
Sodium: 141 meq/L (ref 135–145)
Total Bilirubin: 0.6 mg/dL (ref 0.2–1.2)
Total Protein: 7.1 g/dL (ref 6.0–8.3)

## 2023-09-02 LAB — URINALYSIS, ROUTINE W REFLEX MICROSCOPIC
Bilirubin Urine: NEGATIVE
Hgb urine dipstick: NEGATIVE
Ketones, ur: NEGATIVE
Leukocytes,Ua: NEGATIVE
Nitrite: NEGATIVE
Specific Gravity, Urine: 1.025 (ref 1.000–1.030)
Total Protein, Urine: 30 — AB
Urine Glucose: NEGATIVE
Urobilinogen, UA: 0.2 (ref 0.0–1.0)
pH: 6 (ref 5.0–8.0)

## 2023-09-02 LAB — CBC
HCT: 38.8 % — ABNORMAL LOW (ref 39.0–52.0)
Hemoglobin: 13.1 g/dL (ref 13.0–17.0)
MCHC: 33.8 g/dL (ref 30.0–36.0)
MCV: 89 fL (ref 78.0–100.0)
Platelets: 217 10*3/uL (ref 150.0–400.0)
RBC: 4.37 Mil/uL (ref 4.22–5.81)
RDW: 13.8 % (ref 11.5–15.5)
WBC: 5.1 10*3/uL (ref 4.0–10.5)

## 2023-09-02 LAB — MICROALBUMIN / CREATININE URINE RATIO
Creatinine,U: 89.2 mg/dL
Microalb Creat Ratio: 27.1 mg/g (ref 0.0–30.0)
Microalb, Ur: 24.2 mg/dL — ABNORMAL HIGH (ref 0.0–1.9)

## 2023-09-02 LAB — HEMOGLOBIN A1C: Hgb A1c MFr Bld: 7.1 % — ABNORMAL HIGH (ref 4.6–6.5)

## 2023-09-02 LAB — LIPID PANEL
Cholesterol: 197 mg/dL (ref 0–200)
HDL: 48.4 mg/dL (ref >39.00)
LDL Cholesterol: 70 mg/dL (ref 0–99)
NonHDL: 148.47
Total CHOL/HDL Ratio: 4
Triglycerides: 390 mg/dL — ABNORMAL HIGH (ref 0.0–149.0)
VLDL: 78 mg/dL — ABNORMAL HIGH (ref 0.0–40.0)

## 2023-09-02 LAB — TSH: TSH: 0.53 u[IU]/mL (ref 0.35–5.50)

## 2023-09-02 LAB — PSA: PSA: 0.03 ng/mL — ABNORMAL LOW (ref 0.10–4.00)

## 2023-09-02 NOTE — Patient Instructions (Addendum)
It was great seeing you today   We will follow up with you regarding your lab work   Please let me know if you need anything   I will follow up with you regarding your lab work

## 2023-09-02 NOTE — Progress Notes (Signed)
Subjective:    Patient ID: Dakota Lewis, male    DOB: 12-08-1932, 89 y.o.   MRN: [id number]  HPI  Patient presents for yearly preventative medicine examination. He is a pleasant 89 year old male who  has a past medical history of Bilateral pulmonary embolism (HCC), Chronic cough, DM (diabetes mellitus) (HCC), DVT (deep venous thrombosis) (HCC), Gastrointestinal bleed, H/O cardiovascular stress test (05/07/2011), H/O Doppler ultrasound (2013), H/O Doppler ultrasound (2012), H/O echocardiogram (03/14/2011), H/O exercise stress test (2006), Hiatal hernia, Hypertension, and Prostate cancer (HCC).  DM type 2 - managed with lantus 5 units daily. He does monitor his blood sugar at home with readings in the 90's - 150's with most being in the 120-140's. He denies hypoglycemic episodes. He continues to try and eat healthy and stays away from carbs and sugars.  Lab Results  Component Value Date   HGBA1C 6.7 (A) 05/23/2023   HGBA1C 6.2 (A) 11/26/2022   HGBA1C 12.7 (A) 08/16/2022    HTN - managed with losartan 100 mg daily and Norvasc 10 mg. . He denies myalgia or fatigue. He did not take his medication this morning.  He checks his BP at home occasionally with readings mostly in 150-160 per his log.  BP Readings from Last 3 Encounters:  09/02/23 (!) 170/80  08/21/23 (!) 142/80  05/23/23 (!) 170/68    History of pulmonary embolism-takes Eliquis 5 mg twice daily.  He denies black tarry stools, nosebleeds, or coughing up blood  Hyperlipidemia-managed with omega-3 fatty acids twice daily Lab Results  Component Value Date   CHOL 145 08/28/2022   HDL 57.90 08/28/2022   LDLCALC 56 08/28/2022   LDLDIRECT 100.0 07/07/2019   TRIG 158.0 (H) 08/28/2022   CHOLHDL 3 08/28/2022    GERD-prescribed omeprazole 20 mg twice daily.  He feels as though this medication  Prostate cancer-underwent radiation therapy in 2009.  He is followed by urology on a yearly basis  NASH - followed by Gi in  [place].  Osteoarthritis -multiple  joints but especially in his hands.  He has been using Tylenol but this does not seem to help with joint pain.  Acute Issues  Frequent Urination -he is unsure of how long he has had frequent urination but does report that it has been quite sometime.  He is having nocturia where he gets up 3-4 times a night and he has frequent urination during the day.  Sometimes his stream is good and other times he has a little stream.  Denies any dysuria or hematuria.   All immunizations and health maintenance protocols were reviewed with the patient and needed orders were placed.   Appropriate screening laboratory values were ordered for the patient including screening of hyperlipidemia, renal function and hepatic function. If indicated by BPH, a PSA was ordered.  Medication reconciliation,  past medical history, social history, problem list and allergies were reviewed in detail with the patient  Goals were established with regard to weight loss, exercise, and  diet in compliance with medications. 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  Wt Readings from Last 3 Encounters:  09/02/23 196 lb (88.9 kg)  08/21/23 188 lb (85.3 kg)  05/23/23 186 lb 6.4 oz (84.6 kg)   Review of Systems  Constitutional: Negative.   HENT:  Positive for hearing loss (wearing hearing aids).   Eyes: Negative.   Respiratory: Negative.    Cardiovascular: Negative.   Gastrointestinal: Negative.   Endocrine: Negative.   Genitourinary:  Positive for  frequency.  Musculoskeletal:  Positive for arthralgias and back pain.  Skin: Negative.   Allergic/Immunologic: Negative.   Neurological: Negative.   Hematological: Negative.   Psychiatric/Behavioral: Negative.    All other systems reviewed and are negative.  Past Medical History:  Diagnosis Date   Bilateral pulmonary embolism (HCC)    lovenox & coumadin thearpy   Chronic cough    DM (diabetes mellitus) (HCC)    DVT (deep venous  thrombosis) (HCC)    left lower   Gastrointestinal bleed    prior   H/O cardiovascular stress test 05/07/2011   normal study, low risk scan   H/O Doppler ultrasound 2013   venous duplex doppler   H/O Doppler ultrasound 2012   venous duplex doppler   H/O echocardiogram 03/14/2011   EF 65-70%   H/O exercise stress test 2006   neg bruce protocol excercise stress test   Hiatal hernia    Hypertension    Prostate cancer [facility])    s/p radiation    Social History   Socioeconomic History   Marital status: Married    Spouse name: Not on file   Number of children: 3   Years of education: Not on file   Highest education level: Not on file  Occupational History   Not on file  Tobacco Use   Smoking status: Former    Current packs/day: 0.75    Average packs/day: 0.8 packs/day for 20.0 years (15.0 ttl pk-yrs)    Types: Cigarettes    Passive exposure: Past   Smokeless tobacco: Never  Vaping Use   Vaping status: Never Used  Substance and Sexual Activity   Alcohol use: No   Drug use: No   Sexual activity: Not on file  Other Topics Concern   Not on file  Social History Narrative   Not on file   Social Determinants of Health   Financial Resource Strain: Low Risk  (12/20/2022)   Overall Financial Resource Strain (CARDIA)    Difficulty of Paying Living Expenses: Not hard at all  Food Insecurity: No Food Insecurity (12/20/2022)   Hunger Vital Sign    Worried About Running Out of Food in the Last Year: Never true    Ran Out of Food in the Last Year: Never true  Transportation Needs: No Transportation Needs (12/20/2022)   PRAPARE - Administrator, Civil Service (Medical): No    Lack of Transportation (Non-Medical): No  Physical Activity: Insufficiently Active (12/20/2022)   Exercise Vital Sign    Days of Exercise per Week: 5 days    Minutes of Exercise per Session: 20 min  Stress: No Stress Concern Present (12/20/2022)   [employer] of Occupational Health -  Occupational Stress Questionnaire    Feeling of Stress : Not at all  Social Connections: Moderately Integrated (12/20/2022)   Social Connection and Isolation Panel [NHANES]    Frequency of Communication with Friends and Family: More than three times a week    Frequency of Social Gatherings with Friends and Family: More than three times a week    Attends Religious Services: More than 4 times per year    Active Member of [employer] or Organizations: Yes    Attends Banker Meetings: More than 4 times per year    Marital Status: Widowed  Intimate Partner Violence: Not At Risk (12/20/2022)   Humiliation, Afraid, Rape, and Kick questionnaire    Fear of Current or Ex-Partner: No    Emotionally Abused: No  Physically Abused: No    Sexually Abused: No    Past Surgical History:  Procedure Laterality Date   AMPUTATION Left 03/21/2022   Procedure: LEFT RING FINGER AND LEFT SMALL FINGER REVISION AMPUTATION;  Surgeon: Betha Loa, MD;  Location: [facility] OR;  Service: Orthopedics;  Laterality: Left;   event monitor     2012   HERNIA REPAIR Bilateral    1991, 1992   HIATAL HERNIA REPAIR     1997   IVC FILTER INSERTION     PROSTATE SURGERY      Family History  Problem Relation Age of Onset   Stroke Mother    Kidney disease Father    Heart disease Brother    Arthritis Brother    Heart disease Brother    Prostate cancer Brother     Allergies  Allergen Reactions   Lidocaine     ANTI ITCH CREAM, NO SPECIFICATIONS IN RECORDS.   Lisinopril     cough   Metformin Diarrhea    Current Outpatient Medications on File Prior to Visit  Medication Sig Dispense Refill   Accu-Chek Softclix Lancets lancets 1 Stick by route daily. 400 each 3   acetaminophen (TYLENOL) 500 MG tablet Take by mouth as needed.     amLODipine (NORVASC) 10 MG tablet Take 1 tablet (10 mg total) by mouth daily. 90 tablet 0   apixaban (ELIQUIS) 5 MG TABS tablet Take 5 mg by mouth 2 (two) times daily.     Ascorbic Acid  (VITAMIN C) 1000 MG tablet Take 1 tablet by mouth daily.     blood glucose meter kit and supplies KIT Dispense based on patient and insurance preference. Use up to four times daily as directed. 1 each 0   Blood Glucose Monitoring Suppl (ONE TOUCH ULTRA 2) w/Device KIT USE TO TEST BLOOD GLUCOSE TWICE DAILY 1 each 0   Carboxymethylcellulose Sod PF (THERATEARS PF) 0.25 % SOLN INSTILL 1 DROP IN BOTH EYES FOUR TIMES A DAY AS NEEDED     Cholecalciferol 50 MCG (2000 UT) CAPS 1 capsule Orally Once a day     clotrimazole-betamethasone (LOTRISONE) cream Apply 1 application. topically daily. Thin layer twice a day 45 g 2   fluticasone (FLONASE) 50 MCG/ACT nasal spray Place into the nose as needed.     glucose blood (ACCU-CHEK GUIDE) test strip 1 each by Other route 4 (four) times daily. 300 strip 3   Insulin Pen Needle (PEN NEEDLES) 31G X 6 MM MISC 1 Needle by Does not apply route daily. 300 each 0   LANTUS SOLOSTAR 100 UNIT/ML Solostar Pen Inject 10 Units into the skin daily. (Patient taking differently: Inject 5 Units into the skin daily.) 15 mL 0   losartan (COZAAR) 100 MG tablet Take 1 tablet (100 mg total) by mouth daily. 90 tablet 3   omeprazole (PRILOSEC) 20 MG capsule Take 1 capsule (20 mg total) by mouth daily. 90 capsule 3   polyethylene glycol (MIRALAX / GLYCOLAX) 17 g packet TAKE 17 GRAMS BY MOUTH DAILY     potassium chloride (MICRO-K) 10 MEQ CR capsule Take 1 capsule by mouth daily. SCHEDULE OFFICE VISIT FOR FUTURE REFILLS 30 capsule 0   torsemide (DEMADEX) 5 MG tablet Take 1 tablet (5 mg total) by mouth daily. 90 tablet 0   No current facility-administered medications on file prior to visit.    BP (!) 170/80   Pulse 62   Temp 98 F (36.7 C) (Oral)   Ht 5\' 5"  (1.651 m)  Wt 196 lb (88.9 kg)   SpO2 97%   BMI 32.62 kg/m       Objective:   Physical Exam Vitals and nursing note reviewed.  Constitutional:      General: He is not in acute distress.    Appearance: Normal appearance. He  is obese. He is not ill-appearing.  HENT:     Head: Normocephalic and atraumatic.     Right Ear: Tympanic membrane, ear canal and external ear normal. There is no impacted cerumen.     Left Ear: Tympanic membrane, ear canal and external ear normal. There is no impacted cerumen.     Nose: Nose normal. No congestion or rhinorrhea.     Mouth/Throat:     Mouth: Mucous membranes are moist.     Pharynx: Oropharynx is clear.  Eyes:     Extraocular Movements: Extraocular movements intact.     Conjunctiva/sclera: Conjunctivae normal.     Pupils: Pupils are equal, round, and reactive to light.  Neck:     Vascular: No carotid bruit.  Cardiovascular:     Rate and Rhythm: Normal rate and regular rhythm.     Pulses: Normal pulses.     Heart sounds: No murmur heard.    No friction rub. No gallop.  Pulmonary:     Effort: Pulmonary effort is normal.     Breath sounds: Normal breath sounds.  Abdominal:     General: Abdomen is flat. Bowel sounds are normal. There is no distension.     Palpations: Abdomen is soft. There is no mass.     Tenderness: There is no abdominal tenderness. There is no guarding or rebound.     Hernia: No hernia is present.  Musculoskeletal:        General: Tenderness present. No swelling. Normal range of motion.     Cervical back: Normal range of motion and neck supple.  Lymphadenopathy:     Cervical: No cervical adenopathy.  Skin:    General: Skin is warm and dry.     Capillary Refill: Capillary refill takes less than 2 seconds.  Neurological:     General: No focal deficit present.     Mental Status: He is alert and oriented to person, place, and time.     Comments: Decreased grip strength bilaterally.   Psychiatric:        Mood and Affect: Mood normal.        Behavior: Behavior normal.        Thought Content: Thought content normal.        Judgment: Judgment normal.       Assessment & Plan:  1. Routine general medical examination at a health care facility Today  patient counseled on age appropriate routine health concerns for screening and prevention, each reviewed and up to date or declined. Immunizations reviewed and up to date or declined. Labs ordered and reviewed. Risk factors for depression reviewed and negative. Hearing function and visual acuity are intact. ADLs screened and addressed as needed. Functional ability and level of safety reviewed and appropriate. Education, counseling and referrals performed based on assessed risks today. Patient provided with a copy of personalized plan for preventive services. - Continue to stay active and eat a low car/low sugar diet   2. Type 2 diabetes mellitus with hyperglycemia, with long-term current use of insulin (HCC) - Consider increasing insulin  - Follow up in 3-6 months depending on A1c - Lipid panel; Future - TSH; Future - CBC; Future - Comprehensive metabolic  panel; Future - Hemoglobin A1c; Future - Microalbumin/Creatinine Ratio, Urine; Future - Lipid panel - TSH - CBC - Comprehensive metabolic panel - Hemoglobin A1c - Microalbumin/Creatinine Ratio, Urine  3. Essential hypertension - elevated today and at home - Will check  labs, consider changing Losartan to Benicar.  - Lipid panel; Future - TSH; Future - CBC; Future - Comprehensive metabolic panel; Future - Hemoglobin A1c; Future - Microalbumin/Creatinine Ratio, Urine; Future - Lipid panel - TSH - CBC - Comprehensive metabolic panel - Hemoglobin A1c - Microalbumin/Creatinine Ratio, Urine  4. Mixed hyperlipidemia - Consider adding statin  - Lipid panel; Future - TSH; Future - CBC; Future - Comprehensive metabolic panel; Future - Lipid panel - TSH - CBC - Comprehensive metabolic panel  5. Cirrhosis of liver without ascites, unspecified hepatic cirrhosis type (HCC) - Per hepatology  - Lipid panel; Future - TSH; Future - CBC; Future - Comprehensive metabolic panel; Future - Lipid panel - TSH - CBC - Comprehensive  metabolic panel  6. History of prostate cancer  - PSA; Future - PSA  7. Gastroesophageal reflux disease without esophagitis - continue PPI  - Lipid panel; Future - TSH; Future - CBC; Future - Comprehensive metabolic panel; Future - Lipid panel - TSH - CBC - Comprehensive metabolic panel  8. Need for influenza vaccination  - Flu Vaccine Trivalent High Dose (Fluad)  9. Frequent urination - Likely OAB and BPH  - Sample of Myrbetriq 50 mg given. Lot: [id number], 4 boxes - Urinalysis; Future - Urinalysis  10. Primary osteoarthritis involving multiple joints - Consider Cymbalta   Shirline Frees, NP

## 2023-09-03 ENCOUNTER — Other Ambulatory Visit: Payer: Self-pay | Admitting: Adult Health

## 2023-09-03 MED ORDER — VALSARTAN 160 MG PO TABS: 160.0000 mg | ORAL_TABLET | Freq: Every day | ORAL | 0 refills | Status: DC

## 2023-09-03 MED ORDER — DULOXETINE HCL 30 MG PO CPEP: 30.0000 mg | ORAL_CAPSULE | Freq: Every day | ORAL | 1 refills | Status: DC

## 2023-09-20 ENCOUNTER — Other Ambulatory Visit: Payer: Self-pay | Admitting: Adult Health

## 2023-09-20 DIAGNOSIS — E1165 Type 2 diabetes mellitus with hyperglycemia: Secondary | ICD-10-CM

## 2023-09-24 ENCOUNTER — Other Ambulatory Visit: Payer: Self-pay | Admitting: Adult Health

## 2023-09-24 DIAGNOSIS — I10 Essential (primary) hypertension: Secondary | ICD-10-CM

## 2023-10-22 DIAGNOSIS — D044 Carcinoma in situ of skin of scalp and neck: Secondary | ICD-10-CM | POA: Diagnosis not present

## 2023-10-23 DIAGNOSIS — C44329 Squamous cell carcinoma of skin of other parts of face: Secondary | ICD-10-CM | POA: Diagnosis not present

## 2023-10-23 DIAGNOSIS — L570 Actinic keratosis: Secondary | ICD-10-CM | POA: Diagnosis not present

## 2023-10-24 ENCOUNTER — Other Ambulatory Visit: Payer: Self-pay | Admitting: Adult Health

## 2023-10-24 DIAGNOSIS — I10 Essential (primary) hypertension: Secondary | ICD-10-CM

## 2023-10-27 ENCOUNTER — Other Ambulatory Visit (HOSPITAL_COMMUNITY): Payer: Self-pay

## 2023-10-27 ENCOUNTER — Telehealth: Payer: Self-pay

## 2023-10-27 NOTE — Telephone Encounter (Signed)
*  Primary  Pharmacy Patient Advocate Encounter   Received notification from Fax that prior authorization for Accu-Chek Softclix Lancets  is required/requested.   Insurance verification completed.   The patient is insured through [facility] ADVANTAGE/RX ADVANCE .   Per test claim:  One Touch diabetic supplies is preferred by the insurance.  If suggested medication is appropriate, Please send in a new RX and discontinue this one. If not, please advise as to why it's not appropriate so that we may request a Prior Authorization. Please note, some preferred medications may still require a PA

## 2023-10-29 ENCOUNTER — Other Ambulatory Visit: Payer: Self-pay | Admitting: Adult Health

## 2023-10-29 NOTE — Telephone Encounter (Signed)
Noted  

## 2023-10-31 ENCOUNTER — Telehealth: Payer: Self-pay

## 2023-10-31 NOTE — Telephone Encounter (Signed)
Copied from CRM [id number]. Topic: Clinical - Prescription Issue >> Oct 31, 2023  3:46 PM Orinda Kenner C wrote: Reason for CRM: Inetta Fermo from [facility] [phone] needs a new prescription on meter, lancets and test strips. Patient will be out of medications, his insurance does not cover Accu-chek and cover One Touch. Please advise and send new scripts to [facility] - [place], [place] -  [address] [place] [address] [phone]:[phone]

## 2023-11-04 ENCOUNTER — Ambulatory Visit (INDEPENDENT_AMBULATORY_CARE_PROVIDER_SITE_OTHER): Payer: Self-pay | Admitting: Adult Health

## 2023-11-04 ENCOUNTER — Encounter: Payer: Self-pay | Admitting: Adult Health

## 2023-11-04 VITALS — BP 140/70 | HR 91 | Temp 97.4°F | Ht 65.0 in | Wt 192.0 lb

## 2023-11-04 DIAGNOSIS — Z7984 Long term (current) use of oral hypoglycemic drugs: Secondary | ICD-10-CM

## 2023-11-04 DIAGNOSIS — M150 Primary generalized (osteo)arthritis: Secondary | ICD-10-CM

## 2023-11-04 DIAGNOSIS — E119 Type 2 diabetes mellitus without complications: Secondary | ICD-10-CM | POA: Diagnosis not present

## 2023-11-04 DIAGNOSIS — I10 Essential (primary) hypertension: Secondary | ICD-10-CM | POA: Diagnosis not present

## 2023-11-04 MED ORDER — LANCETS MISC. MISC: 1.0000 | Freq: Three times a day (TID) | 0 refills | Status: DC

## 2023-11-04 MED ORDER — BLOOD GLUCOSE TEST VI STRP: 1.0000 | ORAL_STRIP | Freq: Three times a day (TID) | 1 refills | Status: DC

## 2023-11-04 MED ORDER — BLOOD GLUCOSE MONITORING SUPPL DEVI: 1.0000 | Freq: Three times a day (TID) | 0 refills | Status: DC

## 2023-11-04 MED ORDER — DULOXETINE HCL 60 MG PO CPEP: 60.0000 mg | ORAL_CAPSULE | Freq: Every day | ORAL | 1 refills | Status: DC

## 2023-11-04 MED ORDER — LANCET DEVICE MISC: 1.0000 | Freq: Three times a day (TID) | 0 refills | Status: DC

## 2023-11-04 MED ORDER — VALSARTAN 160 MG PO TABS: 160.0000 mg | ORAL_TABLET | Freq: Every day | ORAL | 3 refills | Status: AC

## 2023-11-04 NOTE — Telephone Encounter (Signed)
Glucometer kit sent to pharmacy

## 2023-11-04 NOTE — Progress Notes (Signed)
Subjective:    Patient ID: Dakota Lewis, male    DOB: September 24, 1933, 89 y.o.   MRN: [id number]  HPI 89 year old male who  has a past medical history of Bilateral pulmonary embolism (HCC), Chronic cough, DM (diabetes mellitus) (HCC), DVT (deep venous thrombosis) (HCC), Gastrointestinal bleed, H/O cardiovascular stress test (05/07/2011), H/O Doppler ultrasound (2013), H/O Doppler ultrasound (2012), H/O echocardiogram (03/14/2011), H/O exercise stress test (2006), Hiatal hernia, Hypertension, and Prostate cancer (HCC).  He presents to the office today for follow up regarding regarding osteoarthritis especially in his hands. . During his last visit in November 2024 we started on him on Cymbalta 30 mg daily. He reports that he has noticed some improvement overall in his arthritic pain but it has not resolved. He feels as though his grip strength is better.   He also needs a refill of Valsartan   Reports that his doctor at the [place] placed him on Jardiance 25 mg - take 1/2 tab daily earlier today to help him come off insulin    Review of Systems See HPI   Past Medical History:  Diagnosis Date   Bilateral pulmonary embolism (HCC)    lovenox & coumadin thearpy   Chronic cough    DM (diabetes mellitus) (HCC)    DVT (deep venous thrombosis) (HCC)    left lower   Gastrointestinal bleed    prior   H/O cardiovascular stress test 05/07/2011   normal study, low risk scan   H/O Doppler ultrasound 2013   venous duplex doppler   H/O Doppler ultrasound 2012   venous duplex doppler   H/O echocardiogram 03/14/2011   EF 65-70%   H/O exercise stress test 2006   neg bruce protocol excercise stress test   Hiatal hernia    Hypertension    Prostate cancer [facility])    s/p radiation    Social History   Socioeconomic History   Marital status: Married    Spouse name: Not on file   Number of children: 3   Years of education: Not on file   Highest education level: Not on file  Occupational History   Not on  file  Tobacco Use   Smoking status: Former    Current packs/day: 0.75    Average packs/day: 0.8 packs/day for 20.0 years (15.0 ttl pk-yrs)    Types: Cigarettes    Passive exposure: Past   Smokeless tobacco: Never  Vaping Use   Vaping status: Never Used  Substance and Sexual Activity   Alcohol use: No   Drug use: No   Sexual activity: Not on file  Other Topics Concern   Not on file  Social History Narrative   Not on file   Social Drivers of Health   Financial Resource Strain: Low Risk  (12/20/2022)   Overall Financial Resource Strain (CARDIA)    Difficulty of Paying Living Expenses: Not hard at all  Food Insecurity: No Food Insecurity (12/20/2022)   Hunger Vital Sign    Worried About Running Out of Food in the Last Year: Never true    Ran Out of Food in the Last Year: Never true  Transportation Needs: No Transportation Needs (12/20/2022)   PRAPARE - Administrator, Civil Service (Medical): No    Lack of Transportation (Non-Medical): No  Physical Activity: Insufficiently Active (12/20/2022)   Exercise Vital Sign    Days of Exercise per Week: 5 days    Minutes of Exercise per Session: 20 min  Stress: No  Stress Concern Present (12/20/2022)   [employer] of Occupational Health - Occupational Stress Questionnaire    Feeling of Stress : Not at all  Social Connections: Moderately Integrated (12/20/2022)   Social Connection and Isolation Panel [NHANES]    Frequency of Communication with Friends and Family: More than three times a week    Frequency of Social Gatherings with Friends and Family: More than three times a week    Attends Religious Services: More than 4 times per year    Active Member of [employer] or Organizations: Yes    Attends Banker Meetings: More than 4 times per year    Marital Status: Widowed  Intimate Partner Violence: Not At Risk (12/20/2022)   Humiliation, Afraid, Rape, and Kick questionnaire    Fear of Current or Ex-Partner: No     Emotionally Abused: No    Physically Abused: No    Sexually Abused: No    Past Surgical History:  Procedure Laterality Date   AMPUTATION Left 03/21/2022   Procedure: LEFT RING FINGER AND LEFT SMALL FINGER REVISION AMPUTATION;  Surgeon: Betha Loa, MD;  Location: [facility] OR;  Service: Orthopedics;  Laterality: Left;   event monitor     2012   HERNIA REPAIR Bilateral    1991, 1992   HIATAL HERNIA REPAIR     1997   IVC FILTER INSERTION     PROSTATE SURGERY      Family History  Problem Relation Age of Onset   Stroke Mother    Kidney disease Father    Heart disease Brother    Arthritis Brother    Heart disease Brother    Prostate cancer Brother     Allergies  Allergen Reactions   Lidocaine     ANTI ITCH CREAM, NO SPECIFICATIONS IN RECORDS.   Lisinopril     cough   Metformin Diarrhea    Current Outpatient Medications on File Prior to Visit  Medication Sig Dispense Refill   Accu-Chek Softclix Lancets lancets 1 Stick by route daily. 400 each 3   acetaminophen (TYLENOL) 500 MG tablet Take by mouth as needed.     amLODipine (NORVASC) 10 MG tablet Take 1 tablet (10 mg total) by mouth daily. 90 tablet 0   apixaban (ELIQUIS) 5 MG TABS tablet Take 5 mg by mouth 2 (two) times daily.     Ascorbic Acid (VITAMIN C) 1000 MG tablet Take 1 tablet by mouth daily.     blood glucose meter kit and supplies KIT Dispense based on patient and insurance preference. Use up to four times daily as directed. 1 each 0   Blood Glucose Monitoring Suppl (ONE TOUCH ULTRA 2) w/Device KIT USE TO TEST BLOOD GLUCOSE TWICE DAILY 1 each 0   Carboxymethylcellulose Sod PF (THERATEARS PF) 0.25 % SOLN INSTILL 1 DROP IN BOTH EYES FOUR TIMES A DAY AS NEEDED     Cholecalciferol 50 MCG (2000 UT) CAPS 1 capsule Orally Once a day     clotrimazole-betamethasone (LOTRISONE) cream Apply 1 application. topically daily. Thin layer twice a day 45 g 2   fluticasone (FLONASE) 50 MCG/ACT nasal spray Place into the nose as needed.      glucose blood (ACCU-CHEK GUIDE) test strip 1 each by Other route 4 (four) times daily. 300 strip 3   LANTUS SOLOSTAR 100 UNIT/ML Solostar Pen Inject 10 Units into the skin daily. (Patient taking differently: Inject 5 Units into the skin daily.) 15 mL 0   omeprazole (PRILOSEC) 20 MG capsule Take 1  capsule (20 mg total) by mouth daily. 90 capsule 3   polyethylene glycol (MIRALAX / GLYCOLAX) 17 g packet TAKE 17 GRAMS BY MOUTH DAILY     potassium chloride (MICRO-K) 10 MEQ CR capsule Take 1 capsule by mouth daily. SCHEDULE OFFICE VISIT FOR FUTURE REFILLS 30 capsule 0   TECHLITE PEN NEEDLES 32G X 6 MM MISC USE ONCE DAILY 300 each 0   torsemide (DEMADEX) 5 MG tablet Take 1 tablet (5 mg total) by mouth daily. 90 tablet 0   No current facility-administered medications on file prior to visit.    BP (!) 140/70   Pulse 91   Temp (!) 97.4 F (36.3 C) (Oral)   Ht 5\' 5"  (1.651 m)   Wt 192 lb (87.1 kg)   SpO2 98%   BMI 31.95 kg/m       Objective:   Physical Exam Vitals and nursing note reviewed.  Constitutional:      Appearance: Normal appearance.  HENT:     Right Ear: Tympanic membrane, ear canal and external ear normal. There is no impacted cerumen.     Left Ear: Tympanic membrane, ear canal and external ear normal. There is no impacted cerumen.  Cardiovascular:     Rate and Rhythm: Normal rate and regular rhythm.     Pulses: Normal pulses.     Heart sounds: Normal heart sounds.  Pulmonary:     Effort: Pulmonary effort is normal.     Breath sounds: Normal breath sounds.  Musculoskeletal:        General: Normal range of motion.  Skin:    General: Skin is warm and dry.  Neurological:     General: No focal deficit present.     Mental Status: He is alert and oriented to person, place, and time.  Psychiatric:        Mood and Affect: Mood normal.        Behavior: Behavior normal.        Thought Content: Thought content normal.        Judgment: Judgment normal.       Assessment &  Plan:  1. Primary osteoarthritis involving multiple joints (Primary) - Will increase Cymbalta to 60 mg daily.  - Follow up with in one month when he returns for diabetes check  - DULoxetine (CYMBALTA) 60 MG capsule; Take 1 capsule (60 mg total) by mouth daily.  Dispense: 90 capsule; Refill: 1  2. Essential hypertension - Controlled. No change in medication  - valsartan (DIOVAN) 160 MG tablet; Take 1 tablet (160 mg total) by mouth daily.  Dispense: 90 tablet; Refill: 3  3. Diabetes mellitus treated with oral medication (HCC) - He will follow up in 1 month for scheduled A1c check. Ok to start [place]. Can come off insulin in 3 days  - empagliflozin (JARDIANCE) 25 MG TABS tablet; Take by mouth daily. Taking 1/2 tab daily   Shirline Frees, NP

## 2023-11-04 NOTE — Addendum Note (Signed)
Addended by: Waymon Amato R on: 11/04/2023 04:56 PM   Modules accepted: Orders

## 2023-11-06 ENCOUNTER — Other Ambulatory Visit: Payer: Self-pay | Admitting: Adult Health

## 2023-11-06 DIAGNOSIS — L570 Actinic keratosis: Secondary | ICD-10-CM | POA: Diagnosis not present

## 2023-11-06 DIAGNOSIS — L821 Other seborrheic keratosis: Secondary | ICD-10-CM | POA: Diagnosis not present

## 2023-11-06 DIAGNOSIS — C44329 Squamous cell carcinoma of skin of other parts of face: Secondary | ICD-10-CM | POA: Diagnosis not present

## 2023-11-10 ENCOUNTER — Other Ambulatory Visit (HOSPITAL_COMMUNITY): Payer: Self-pay

## 2023-11-11 ENCOUNTER — Ambulatory Visit: Payer: Medicare PPO | Admitting: Adult Health

## 2023-11-21 ENCOUNTER — Other Ambulatory Visit (HOSPITAL_COMMUNITY): Payer: Self-pay

## 2023-12-03 ENCOUNTER — Encounter (HOSPITAL_COMMUNITY): Payer: Self-pay | Admitting: *Deleted

## 2023-12-03 ENCOUNTER — Other Ambulatory Visit: Payer: Self-pay

## 2023-12-03 ENCOUNTER — Ambulatory Visit: Payer: Self-pay | Admitting: Adult Health

## 2023-12-03 ENCOUNTER — Emergency Department (HOSPITAL_COMMUNITY)
Admission: EM | Admit: 2023-12-03 | Discharge: 2023-12-03 | Discharge status: Against Medical Advice | Payer: PPO | Source: Home / Self Care

## 2023-12-03 ENCOUNTER — Emergency Department (HOSPITAL_COMMUNITY): Payer: PPO

## 2023-12-03 DIAGNOSIS — R0600 Dyspnea, unspecified: Secondary | ICD-10-CM | POA: Insufficient documentation

## 2023-12-03 DIAGNOSIS — R630 Anorexia: Secondary | ICD-10-CM | POA: Diagnosis not present

## 2023-12-03 DIAGNOSIS — J189 Pneumonia, unspecified organism: Secondary | ICD-10-CM | POA: Diagnosis not present

## 2023-12-03 DIAGNOSIS — R0981 Nasal congestion: Secondary | ICD-10-CM | POA: Insufficient documentation

## 2023-12-03 DIAGNOSIS — R059 Cough, unspecified: Secondary | ICD-10-CM | POA: Insufficient documentation

## 2023-12-03 DIAGNOSIS — Z8679 Personal history of other diseases of the circulatory system: Secondary | ICD-10-CM | POA: Diagnosis not present

## 2023-12-03 DIAGNOSIS — R051 Acute cough: Secondary | ICD-10-CM | POA: Diagnosis not present

## 2023-12-03 DIAGNOSIS — R0602 Shortness of breath: Secondary | ICD-10-CM | POA: Diagnosis not present

## 2023-12-03 DIAGNOSIS — R5383 Other fatigue: Secondary | ICD-10-CM | POA: Diagnosis not present

## 2023-12-03 DIAGNOSIS — Z5321 Procedure and treatment not carried out due to patient leaving prior to being seen by health care provider: Secondary | ICD-10-CM | POA: Insufficient documentation

## 2023-12-03 DIAGNOSIS — R6883 Chills (without fever): Secondary | ICD-10-CM | POA: Diagnosis not present

## 2023-12-03 DIAGNOSIS — R0902 Hypoxemia: Secondary | ICD-10-CM | POA: Diagnosis not present

## 2023-12-03 DIAGNOSIS — I700 Atherosclerosis of aorta: Secondary | ICD-10-CM | POA: Diagnosis not present

## 2023-12-03 DIAGNOSIS — R9389 Abnormal findings on diagnostic imaging of other specified body structures: Secondary | ICD-10-CM | POA: Diagnosis not present

## 2023-12-03 DIAGNOSIS — R0789 Other chest pain: Secondary | ICD-10-CM | POA: Diagnosis not present

## 2023-12-03 DIAGNOSIS — R000 Tachycardia, unspecified: Secondary | ICD-10-CM | POA: Diagnosis not present

## 2023-12-03 LAB — TROPONIN I (HIGH SENSITIVITY)
Troponin I (High Sensitivity): 7 ng/L (ref <18)
Troponin I (High Sensitivity): 8 ng/L (ref <18)

## 2023-12-03 LAB — BASIC METABOLIC PANEL
Anion gap: 13 (ref 5–15)
BUN: 18 mg/dL (ref 8–23)
CO2: 23 mmol/L (ref 22–32)
Calcium: 9.5 mg/dL (ref 8.9–10.3)
Chloride: 99 mmol/L (ref 98–111)
Creatinine, Ser: 1.47 mg/dL — ABNORMAL HIGH (ref 0.61–1.24)
GFR, Estimated: 46 mL/min — ABNORMAL LOW (ref >60)
Glucose, Bld: 240 mg/dL — ABNORMAL HIGH (ref 70–99)
Potassium: 4.3 mmol/L (ref 3.5–5.1)
Sodium: 135 mmol/L (ref 135–145)

## 2023-12-03 LAB — CBC WITH DIFFERENTIAL/PLATELET
Abs Immature Granulocytes: 0.09 10*3/uL — ABNORMAL HIGH (ref 0.00–0.07)
Basophils Absolute: 0 10*3/uL (ref 0.0–0.1)
Basophils Relative: 0 %
Eosinophils Absolute: 0 10*3/uL (ref 0.0–0.5)
Eosinophils Relative: 0 %
HCT: 43.4 % (ref 39.0–52.0)
Hemoglobin: 14.5 g/dL (ref 13.0–17.0)
Immature Granulocytes: 1 %
Lymphocytes Relative: 10 %
Lymphs Abs: 1.3 10*3/uL (ref 0.7–4.0)
MCH: 29.7 pg (ref 26.0–34.0)
MCHC: 33.4 g/dL (ref 30.0–36.0)
MCV: 88.9 fL (ref 80.0–100.0)
Monocytes Absolute: 0.9 10*3/uL (ref 0.1–1.0)
Monocytes Relative: 6 %
Neutro Abs: 11.5 10*3/uL — ABNORMAL HIGH (ref 1.7–7.7)
Neutrophils Relative %: 83 %
Platelets: 222 10*3/uL (ref 150–400)
RBC: 4.88 MIL/uL (ref 4.22–5.81)
RDW: 12.7 % (ref 11.5–15.5)
WBC: 13.9 10*3/uL — ABNORMAL HIGH (ref 4.0–10.5)
nRBC: 0 % (ref 0.0–0.2)

## 2023-12-03 LAB — RESP PANEL BY RT-PCR (RSV, FLU A&B, COVID)  RVPGX2
Influenza A by PCR: NEGATIVE
Influenza B by PCR: NEGATIVE
Resp Syncytial Virus by PCR: NEGATIVE
SARS Coronavirus 2 by RT PCR: NEGATIVE

## 2023-12-03 NOTE — ED Triage Notes (Signed)
 The pt has had a cough with thick mucous for over a week  no temp   he was seen at an urgent care in [place] and they sent him here for treatment

## 2023-12-03 NOTE — ED Notes (Signed)
 Patients daughter states that he cannot wait any longer she will try to take him to urgent care tomorrow. Pulled OTF.

## 2023-12-03 NOTE — ED Provider Triage Note (Signed)
 Emergency Medicine Provider Triage Evaluation Note  Dakota Lewis , a 89 y.o. male  was evaluated in triage.  Pt complains of cough and congestion. Symptoms for a few weeks. Originally went to the [place], diagnosed with bronchitis, given an unknown antibiotic, improved some with this but after course completion his symptoms returned.  Called his primary doctor who recommended he go to urgent care.  He went to urgent care in [place] and they sent him here for evaluation.  They were reportedly not given any paperwork to come here with, patient's daughter reports that he had a chest x-ray that was clear, but his EKG "did not look good" and his heart rate was elevated and it was recommended that he come here for evaluation.  She also notes that they told him his oxygen saturation was in the 80s.  However they did not recommend that he come by ambulance today.  All of this is provided by daughter, no information available in the patient's chart. He does note some soreness in his chest which he attributes to cough. Endorses some dyspnea only when he is coughing a lot. No leg pain or leg swelling.  Review of Systems  Positive:  Negative:   Physical Exam  BP (!) 160/82   Pulse (!) 117   Temp 98.7 F (37.1 C)   Resp 16   Ht 5\' 5"  (1.651 m)   Wt 87.1 kg   SpO2 96%   BMI 31.95 kg/m  Gen:   Awake, no distress   Resp:  Normal effort productive cough present MSK:   Moves extremities without difficulty no extremity edema Other:    Medical Decision Making  Medically screening exam initiated at 5:54 PM.  Appropriate orders placed.  Dakota Lewis was informed that the remainder of the evaluation will be completed by another provider, this initial triage assessment does not replace that evaluation, and the importance of remaining in the ED until their evaluation is complete.  On room air, speaking in complete sentences   Vear Clock 12/03/23 1759

## 2023-12-03 NOTE — Telephone Encounter (Addendum)
 Copied from CRM [id number]. Topic: Clinical - Red Word Triage >> Dec 03, 2023 11:48 AM Fredrich Romans wrote: Red Word that prompted transfer to Nurse Triage: cough,tightness in chest,very fatigue ,spitting up mucous  Chief Complaint: Cough Symptoms: Cough, chest tight, SOB only with activity Pertinent Negatives: Patient denies fever Disposition: [] ED /[x] Urgent Care (no appt availability in office) / [] Appointment(In office/virtual)/ []  [facility]/ [] Home Care/ [] Refused Recommended Disposition /[] [facility]/ []  Follow-up with PCP Additional Notes: Patient's daughter called the office. She expressed frustration about needing to answer assessment questions. She stated she use to call the office and they would just make an appointment with no assessment. This RN explained the new process and the purpose of speaking to a triage nurse. I offered to transfer her to the clinic access line if she wanted to speak to someone at the clinic directly. She declined and agreed to do the assessment. Patient stated that patient has been having cough with chest tightness. Denies SOB at this time. No SOB at rest, but has some SOB with activity. Patient's PCP has no appointments until 2/28. Offered to schedule a sooner appt at different office or advised urgent care. She stated she will take patient to urgent care.   Reason for Disposition  [1] Continuous (nonstop) coughing interferes with work or school AND [2] no improvement using cough treatment per Care Advice  Answer Assessment - Initial Assessment Questions 1. ONSET: "When did the cough begin?"      Couple days ago  2. SEVERITY: "How bad is the cough today?"      Severe  3. SPUTUM: "Describe the color of your sputum" (none, dry cough; clear, white, yellow, green)     Green  4. HEMOPTYSIS: "Are you coughing up any blood?" If so ask: "How much?" (flecks, streaks, tablespoons, etc.)     N/A  5. DIFFICULTY BREATHING: "Are you having  difficulty breathing?" If Yes, ask: "How bad is it?" (e.g., mild, moderate, severe)    - MILD: No SOB at rest, mild SOB with walking, speaks normally in sentences, can lie down, no retractions, pulse < 100.    - MODERATE: SOB at rest, SOB with minimal exertion and prefers to sit, cannot lie down flat, speaks in phrases, mild retractions, audible wheezing, pulse 100-120.    - SEVERE: Very SOB at rest, speaks in single words, struggling to breathe, sitting hunched forward, retractions, pulse > 120      No  6. FEVER: "Do you have a fever?" If Yes, ask: "What is your temperature, how was it measured, and when did it start?"     No  7. OTHER SYMPTOMS: "Do you have any other symptoms?" (e.g., runny nose, wheezing, chest pain)       Only SOB with activity,  chest tightness  Protocols used: Cough - Acute Productive-A-AH

## 2023-12-04 ENCOUNTER — Other Ambulatory Visit: Payer: Self-pay

## 2023-12-04 ENCOUNTER — Encounter (HOSPITAL_COMMUNITY): Payer: Self-pay

## 2023-12-04 ENCOUNTER — Emergency Department (HOSPITAL_COMMUNITY): Payer: PPO

## 2023-12-04 ENCOUNTER — Inpatient Hospital Stay (HOSPITAL_COMMUNITY)
Admission: EM | Admit: 2023-12-04 | Discharge: 2023-12-06 | DRG: 194 | Disposition: A | Discharge status: Home / Self Care | Payer: PPO | Attending: Internal Medicine | Admitting: Internal Medicine

## 2023-12-04 DIAGNOSIS — I10 Essential (primary) hypertension: Secondary | ICD-10-CM | POA: Diagnosis present

## 2023-12-04 DIAGNOSIS — I110 Hypertensive heart disease with heart failure: Secondary | ICD-10-CM | POA: Diagnosis not present

## 2023-12-04 DIAGNOSIS — M159 Polyosteoarthritis, unspecified: Secondary | ICD-10-CM | POA: Diagnosis present

## 2023-12-04 DIAGNOSIS — J312 Chronic pharyngitis: Secondary | ICD-10-CM | POA: Diagnosis present

## 2023-12-04 DIAGNOSIS — J9601 Acute respiratory failure with hypoxia: Secondary | ICD-10-CM

## 2023-12-04 DIAGNOSIS — Z8546 Personal history of malignant neoplasm of prostate: Secondary | ICD-10-CM | POA: Diagnosis not present

## 2023-12-04 DIAGNOSIS — I2489 Other forms of acute ischemic heart disease: Secondary | ICD-10-CM | POA: Diagnosis present

## 2023-12-04 DIAGNOSIS — E785 Hyperlipidemia, unspecified: Secondary | ICD-10-CM | POA: Diagnosis not present

## 2023-12-04 DIAGNOSIS — Z0389 Encounter for observation for other suspected diseases and conditions ruled out: Secondary | ICD-10-CM | POA: Diagnosis not present

## 2023-12-04 DIAGNOSIS — R079 Chest pain, unspecified: Secondary | ICD-10-CM | POA: Diagnosis not present

## 2023-12-04 DIAGNOSIS — Z1152 Encounter for screening for COVID-19: Secondary | ICD-10-CM

## 2023-12-04 DIAGNOSIS — R509 Fever, unspecified: Secondary | ICD-10-CM | POA: Diagnosis not present

## 2023-12-04 DIAGNOSIS — Z86718 Personal history of other venous thrombosis and embolism: Secondary | ICD-10-CM

## 2023-12-04 DIAGNOSIS — Z823 Family history of stroke: Secondary | ICD-10-CM

## 2023-12-04 DIAGNOSIS — Z8042 Family history of malignant neoplasm of prostate: Secondary | ICD-10-CM

## 2023-12-04 DIAGNOSIS — R918 Other nonspecific abnormal finding of lung field: Secondary | ICD-10-CM | POA: Diagnosis not present

## 2023-12-04 DIAGNOSIS — I5032 Chronic diastolic (congestive) heart failure: Secondary | ICD-10-CM | POA: Diagnosis not present

## 2023-12-04 DIAGNOSIS — I4510 Unspecified right bundle-branch block: Secondary | ICD-10-CM | POA: Diagnosis present

## 2023-12-04 DIAGNOSIS — K7469 Other cirrhosis of liver: Secondary | ICD-10-CM

## 2023-12-04 DIAGNOSIS — Z86711 Personal history of pulmonary embolism: Secondary | ICD-10-CM | POA: Insufficient documentation

## 2023-12-04 DIAGNOSIS — J189 Pneumonia, unspecified organism: Principal | ICD-10-CM | POA: Diagnosis present

## 2023-12-04 DIAGNOSIS — J40 Bronchitis, not specified as acute or chronic: Secondary | ICD-10-CM | POA: Diagnosis not present

## 2023-12-04 DIAGNOSIS — A419 Sepsis, unspecified organism: Secondary | ICD-10-CM | POA: Diagnosis not present

## 2023-12-04 DIAGNOSIS — Z8261 Family history of arthritis: Secondary | ICD-10-CM

## 2023-12-04 DIAGNOSIS — N281 Cyst of kidney, acquired: Secondary | ICD-10-CM | POA: Diagnosis not present

## 2023-12-04 DIAGNOSIS — R160 Hepatomegaly, not elsewhere classified: Secondary | ICD-10-CM | POA: Insufficient documentation

## 2023-12-04 DIAGNOSIS — R0902 Hypoxemia: Secondary | ICD-10-CM | POA: Diagnosis not present

## 2023-12-04 DIAGNOSIS — Z923 Personal history of irradiation: Secondary | ICD-10-CM | POA: Diagnosis not present

## 2023-12-04 DIAGNOSIS — R053 Chronic cough: Secondary | ICD-10-CM | POA: Diagnosis present

## 2023-12-04 DIAGNOSIS — K760 Fatty (change of) liver, not elsewhere classified: Secondary | ICD-10-CM | POA: Diagnosis not present

## 2023-12-04 DIAGNOSIS — F17210 Nicotine dependence, cigarettes, uncomplicated: Secondary | ICD-10-CM | POA: Diagnosis present

## 2023-12-04 DIAGNOSIS — G8929 Other chronic pain: Secondary | ICD-10-CM | POA: Diagnosis not present

## 2023-12-04 DIAGNOSIS — Z7901 Long term (current) use of anticoagulants: Secondary | ICD-10-CM | POA: Diagnosis not present

## 2023-12-04 DIAGNOSIS — E860 Dehydration: Secondary | ICD-10-CM | POA: Diagnosis not present

## 2023-12-04 DIAGNOSIS — K7460 Unspecified cirrhosis of liver: Secondary | ICD-10-CM | POA: Insufficient documentation

## 2023-12-04 DIAGNOSIS — N179 Acute kidney failure, unspecified: Secondary | ICD-10-CM

## 2023-12-04 DIAGNOSIS — E1165 Type 2 diabetes mellitus with hyperglycemia: Secondary | ICD-10-CM | POA: Diagnosis present

## 2023-12-04 DIAGNOSIS — R221 Localized swelling, mass and lump, neck: Secondary | ICD-10-CM | POA: Diagnosis not present

## 2023-12-04 DIAGNOSIS — E119 Type 2 diabetes mellitus without complications: Secondary | ICD-10-CM

## 2023-12-04 DIAGNOSIS — I6523 Occlusion and stenosis of bilateral carotid arteries: Secondary | ICD-10-CM | POA: Diagnosis not present

## 2023-12-04 DIAGNOSIS — Z7984 Long term (current) use of oral hypoglycemic drugs: Secondary | ICD-10-CM | POA: Diagnosis not present

## 2023-12-04 DIAGNOSIS — R000 Tachycardia, unspecified: Secondary | ICD-10-CM | POA: Diagnosis not present

## 2023-12-04 DIAGNOSIS — Z841 Family history of disorders of kidney and ureter: Secondary | ICD-10-CM

## 2023-12-04 DIAGNOSIS — Z794 Long term (current) use of insulin: Secondary | ICD-10-CM

## 2023-12-04 DIAGNOSIS — I672 Cerebral atherosclerosis: Secondary | ICD-10-CM | POA: Diagnosis not present

## 2023-12-04 DIAGNOSIS — E7849 Other hyperlipidemia: Secondary | ICD-10-CM

## 2023-12-04 DIAGNOSIS — Z8249 Family history of ischemic heart disease and other diseases of the circulatory system: Secondary | ICD-10-CM | POA: Diagnosis not present

## 2023-12-04 DIAGNOSIS — R6520 Severe sepsis without septic shock: Secondary | ICD-10-CM | POA: Diagnosis not present

## 2023-12-04 DIAGNOSIS — J984 Other disorders of lung: Secondary | ICD-10-CM | POA: Diagnosis not present

## 2023-12-04 DIAGNOSIS — I700 Atherosclerosis of aorta: Secondary | ICD-10-CM | POA: Diagnosis not present

## 2023-12-04 DIAGNOSIS — R1900 Intra-abdominal and pelvic swelling, mass and lump, unspecified site: Secondary | ICD-10-CM | POA: Diagnosis not present

## 2023-12-04 DIAGNOSIS — K5730 Diverticulosis of large intestine without perforation or abscess without bleeding: Secondary | ICD-10-CM | POA: Diagnosis not present

## 2023-12-04 DIAGNOSIS — M109 Gout, unspecified: Secondary | ICD-10-CM | POA: Diagnosis present

## 2023-12-04 DIAGNOSIS — Z79899 Other long term (current) drug therapy: Secondary | ICD-10-CM

## 2023-12-04 LAB — URINALYSIS, W/ REFLEX TO CULTURE (INFECTION SUSPECTED)
Bilirubin Urine: NEGATIVE
Glucose, UA: >=500 mg/dL — AB
Hgb urine dipstick: NEGATIVE
Ketones, ur: 20 mg/dL — AB
Leukocytes,Ua: NEGATIVE
Nitrite: NEGATIVE
Protein, ur: 30 mg/dL — AB
Specific Gravity, Urine: 1.022 (ref 1.005–1.030)
pH: 5 (ref 5.0–8.0)

## 2023-12-04 LAB — TROPONIN I (HIGH SENSITIVITY)
Troponin I (High Sensitivity): 18 ng/L — ABNORMAL HIGH (ref <18)
Troponin I (High Sensitivity): 18 ng/L — ABNORMAL HIGH (ref <18)
Troponin I (High Sensitivity): 19 ng/L — ABNORMAL HIGH (ref <18)
Troponin I (High Sensitivity): 19 ng/L — ABNORMAL HIGH (ref <18)

## 2023-12-04 LAB — RESPIRATORY PANEL BY PCR

## 2023-12-04 LAB — CBC WITH DIFFERENTIAL/PLATELET
Abs Immature Granulocytes: 0.2 10*3/uL — ABNORMAL HIGH (ref 0.00–0.07)
Abs Immature Granulocytes: 0 10*3/uL (ref 0.00–0.07)
Basophils Absolute: 0 10*3/uL (ref 0.0–0.1)
Basophils Absolute: 0 10*3/uL (ref 0.0–0.1)
Basophils Relative: 0 %
Basophils Relative: 0 %
Eosinophils Absolute: 0 10*3/uL (ref 0.0–0.5)
Eosinophils Absolute: 0 10*3/uL (ref 0.0–0.5)
Eosinophils Relative: 0 %
Eosinophils Relative: 0 %
HCT: 45.5 % (ref 39.0–52.0)
HCT: 35.1 % — ABNORMAL LOW (ref 39.0–52.0)
Hemoglobin: 15.5 g/dL (ref 13.0–17.0)
Hemoglobin: 12 g/dL — ABNORMAL LOW (ref 13.0–17.0)
Lymphocytes Relative: 2 %
Lymphocytes Relative: 6 %
Lymphs Abs: 0.4 10*3/uL — ABNORMAL LOW (ref 0.7–4.0)
Lymphs Abs: 1.1 10*3/uL (ref 0.7–4.0)
MCH: 30 pg (ref 26.0–34.0)
MCH: 30.1 pg (ref 26.0–34.0)
MCHC: 34.1 g/dL (ref 30.0–36.0)
MCHC: 34.2 g/dL (ref 30.0–36.0)
MCV: 88 fL (ref 80.0–100.0)
MCV: 88 fL (ref 80.0–100.0)
Monocytes Absolute: 2.1 10*3/uL — ABNORMAL HIGH (ref 0.1–1.0)
Monocytes Absolute: 1.3 10*3/uL — ABNORMAL HIGH (ref 0.1–1.0)
Monocytes Relative: 10 %
Monocytes Relative: 7 %
Myelocytes: 1 %
Neutro Abs: 18 10*3/uL — ABNORMAL HIGH (ref 1.7–7.7)
Neutro Abs: 15.7 10*3/uL — ABNORMAL HIGH (ref 1.7–7.7)
Neutrophils Relative %: 87 %
Neutrophils Relative %: 87 %
Platelets: 214 10*3/uL (ref 150–400)
Platelets: 172 10*3/uL (ref 150–400)
RBC: 5.17 MIL/uL (ref 4.22–5.81)
RBC: 3.99 MIL/uL — ABNORMAL LOW (ref 4.22–5.81)
RDW: 12.7 % (ref 11.5–15.5)
RDW: 12.7 % (ref 11.5–15.5)
WBC: 20.7 10*3/uL — ABNORMAL HIGH (ref 4.0–10.5)
WBC: 18 10*3/uL — ABNORMAL HIGH (ref 4.0–10.5)
nRBC: 0 % (ref 0.0–0.2)
nRBC: 0 /100{WBCs}
nRBC: 0 % (ref 0.0–0.2)
nRBC: 0 /100{WBCs}

## 2023-12-04 LAB — RENAL FUNCTION PANEL
Albumin: 2.9 g/dL — ABNORMAL LOW (ref 3.5–5.0)
Anion gap: 14 (ref 5–15)
BUN: 16 mg/dL (ref 8–23)
CO2: 22 mmol/L (ref 22–32)
Calcium: 8.9 mg/dL (ref 8.9–10.3)
Chloride: 98 mmol/L (ref 98–111)
Creatinine, Ser: 1.15 mg/dL (ref 0.61–1.24)
GFR, Estimated: >60 mL/min (ref >60)
Glucose, Bld: 153 mg/dL — ABNORMAL HIGH (ref 70–99)
Phosphorus: 2.5 mg/dL (ref 2.5–4.6)
Potassium: 4.4 mmol/L (ref 3.5–5.1)
Sodium: 134 mmol/L — ABNORMAL LOW (ref 135–145)

## 2023-12-04 LAB — COMPREHENSIVE METABOLIC PANEL
ALT: 30 U/L (ref 0–44)
AST: 30 U/L (ref 15–41)
Albumin: 3.9 g/dL (ref 3.5–5.0)
Alkaline Phosphatase: 81 U/L (ref 38–126)
Anion gap: 16 — ABNORMAL HIGH (ref 5–15)
BUN: 18 mg/dL (ref 8–23)
CO2: 22 mmol/L (ref 22–32)
Calcium: 9.9 mg/dL (ref 8.9–10.3)
Chloride: 94 mmol/L — ABNORMAL LOW (ref 98–111)
Creatinine, Ser: 1.36 mg/dL — ABNORMAL HIGH (ref 0.61–1.24)
GFR, Estimated: 50 mL/min — ABNORMAL LOW (ref >60)
Glucose, Bld: 205 mg/dL — ABNORMAL HIGH (ref 70–99)
Potassium: 4 mmol/L (ref 3.5–5.1)
Sodium: 132 mmol/L — ABNORMAL LOW (ref 135–145)
Total Bilirubin: 1.4 mg/dL — ABNORMAL HIGH (ref 0.0–1.2)
Total Protein: 8.4 g/dL — ABNORMAL HIGH (ref 6.5–8.1)

## 2023-12-04 LAB — URINALYSIS, ROUTINE W REFLEX MICROSCOPIC
Bilirubin Urine: NEGATIVE
Glucose, UA: >=500 mg/dL — AB
Hgb urine dipstick: NEGATIVE
Ketones, ur: 20 mg/dL — AB
Leukocytes,Ua: NEGATIVE
Nitrite: NEGATIVE
Protein, ur: 30 mg/dL — AB
Specific Gravity, Urine: 1.023 (ref 1.005–1.030)
pH: 5 (ref 5.0–8.0)

## 2023-12-04 LAB — GLUCOSE, CAPILLARY
Glucose-Capillary: 168 mg/dL — ABNORMAL HIGH (ref 70–99)
Glucose-Capillary: 150 mg/dL — ABNORMAL HIGH (ref 70–99)

## 2023-12-04 LAB — EXPECTORATED SPUTUM ASSESSMENT W GRAM STAIN, RFLX TO RESP C

## 2023-12-04 LAB — I-STAT CG4 LACTIC ACID, ED: Lactic Acid, Venous: 1.8 mmol/L (ref 0.5–1.9)

## 2023-12-04 LAB — MAGNESIUM: Magnesium: 2 mg/dL (ref 1.7–2.4)

## 2023-12-04 LAB — PROTIME-INR
INR: 1.5 — ABNORMAL HIGH (ref 0.8–1.2)
Prothrombin Time: 18 s — ABNORMAL HIGH (ref 11.4–15.2)

## 2023-12-04 LAB — CREATININE, URINE, RANDOM: Creatinine, Urine: 62 mg/dL

## 2023-12-04 LAB — CBG MONITORING, ED
Glucose-Capillary: 147 mg/dL — ABNORMAL HIGH (ref 70–99)
Glucose-Capillary: 147 mg/dL — ABNORMAL HIGH (ref 70–99)

## 2023-12-04 LAB — PROCALCITONIN: Procalcitonin: 2.5 ng/mL

## 2023-12-04 LAB — APTT: aPTT: 33 s (ref 24–36)

## 2023-12-04 LAB — SODIUM, URINE, RANDOM: Sodium, Ur: 64 mmol/L

## 2023-12-04 LAB — STREP PNEUMONIAE URINARY ANTIGEN: Strep Pneumo Urinary Antigen: NEGATIVE

## 2023-12-04 MED ORDER — DULOXETINE HCL 60 MG PO CPEP
60.0000 mg | ORAL_CAPSULE | Freq: Every day | ORAL | Status: DC
  Administered 2023-12-04 – 2023-12-06 (×3): 60 mg via ORAL
  Filled 2023-12-04 (×3): qty 1

## 2023-12-04 MED ORDER — SODIUM CHLORIDE 0.9% FLUSH: 3.0000 mL | INTRAVENOUS | Status: DC | PRN

## 2023-12-04 MED ORDER — PANTOPRAZOLE SODIUM 40 MG PO TBEC
40.0000 mg | DELAYED_RELEASE_TABLET | Freq: Every day | ORAL | Status: DC
  Administered 2023-12-04 – 2023-12-06 (×3): 40 mg via ORAL
  Filled 2023-12-04 (×3): qty 1

## 2023-12-04 MED ORDER — LACTATED RINGERS IV SOLN: INTRAVENOUS | Status: AC

## 2023-12-04 MED ORDER — SODIUM CHLORIDE 0.9 % IV SOLN
2.0000 g | Freq: Once | INTRAVENOUS | Status: AC
  Administered 2023-12-04: 2 g via INTRAVENOUS
  Filled 2023-12-04: qty 20

## 2023-12-04 MED ORDER — APIXABAN 5 MG PO TABS
5.0000 mg | ORAL_TABLET | Freq: Two times a day (BID) | ORAL | Status: DC
  Administered 2023-12-04 – 2023-12-06 (×5): 5 mg via ORAL
  Filled 2023-12-04 (×5): qty 1

## 2023-12-04 MED ORDER — GUAIFENESIN ER 600 MG PO TB12
600.0000 mg | ORAL_TABLET | Freq: Two times a day (BID) | ORAL | Status: DC
  Administered 2023-12-04 – 2023-12-06 (×5): 600 mg via ORAL
  Filled 2023-12-04 (×5): qty 1

## 2023-12-04 MED ORDER — SODIUM CHLORIDE 0.9% FLUSH
3.0000 mL | Freq: Two times a day (BID) | INTRAVENOUS | Status: DC
  Administered 2023-12-05 – 2023-12-06 (×3): 3 mL via INTRAVENOUS

## 2023-12-04 MED ORDER — SODIUM CHLORIDE 0.9 % IV SOLN
2.0000 g | INTRAVENOUS | Status: DC
  Administered 2023-12-05 – 2023-12-06 (×2): 2 g via INTRAVENOUS
  Filled 2023-12-04 (×2): qty 20

## 2023-12-04 MED ORDER — INSULIN ASPART 100 UNIT/ML IJ SOLN: 0.0000 [IU] | Freq: Every day | INTRAMUSCULAR | Status: DC

## 2023-12-04 MED ORDER — ONDANSETRON HCL 4 MG PO TABS: 4.0000 mg | ORAL_TABLET | Freq: Four times a day (QID) | ORAL | Status: DC | PRN

## 2023-12-04 MED ORDER — SODIUM CHLORIDE 0.9 % IV SOLN
100.0000 mg | Freq: Two times a day (BID) | INTRAVENOUS | Status: DC
  Administered 2023-12-05: 100 mg via INTRAVENOUS
  Filled 2023-12-04: qty 100

## 2023-12-04 MED ORDER — ONDANSETRON HCL 4 MG/2ML IJ SOLN: 4.0000 mg | Freq: Four times a day (QID) | INTRAMUSCULAR | Status: DC | PRN

## 2023-12-04 MED ORDER — ACETAMINOPHEN 325 MG PO TABS: 650.0000 mg | ORAL_TABLET | Freq: Four times a day (QID) | ORAL | Status: DC | PRN

## 2023-12-04 MED ORDER — INSULIN ASPART 100 UNIT/ML IJ SOLN
0.0000 [IU] | Freq: Three times a day (TID) | INTRAMUSCULAR | Status: DC
  Administered 2023-12-04 – 2023-12-06 (×2): 1 [IU] via SUBCUTANEOUS

## 2023-12-04 MED ORDER — SODIUM CHLORIDE 0.9% FLUSH
3.0000 mL | Freq: Two times a day (BID) | INTRAVENOUS | Status: DC
  Administered 2023-12-04 – 2023-12-06 (×4): 3 mL via INTRAVENOUS

## 2023-12-04 MED ORDER — INSULIN GLARGINE 100 UNIT/ML ~~LOC~~ SOLN
10.0000 [IU] | Freq: Every day | SUBCUTANEOUS | Status: DC
  Administered 2023-12-04 – 2023-12-06 (×3): 10 [IU] via SUBCUTANEOUS
  Filled 2023-12-04 (×4): qty 0.1

## 2023-12-04 MED ORDER — SODIUM CHLORIDE 0.9 % IV BOLUS
1000.0000 mL | Freq: Once | INTRAVENOUS | Status: AC
  Administered 2023-12-04: 1000 mL via INTRAVENOUS

## 2023-12-04 MED ORDER — ACETAMINOPHEN 650 MG RE SUPP: 650.0000 mg | Freq: Four times a day (QID) | RECTAL | Status: DC | PRN

## 2023-12-04 MED ORDER — SODIUM CHLORIDE 0.9 % IV SOLN
500.0000 mg | Freq: Once | INTRAVENOUS | Status: AC
  Administered 2023-12-04: 500 mg via INTRAVENOUS
  Filled 2023-12-04: qty 5

## 2023-12-04 MED ORDER — MAGNESIUM SULFATE 2 GM/50ML IV SOLN
2.0000 g | Freq: Once | INTRAVENOUS | Status: AC
  Administered 2023-12-04: 2 g via INTRAVENOUS
  Filled 2023-12-04: qty 50

## 2023-12-04 MED ORDER — SODIUM CHLORIDE 0.9 % IV SOLN: 250.0000 mL | INTRAVENOUS | Status: AC | PRN

## 2023-12-04 NOTE — Plan of Care (Signed)
  Problem: Health Behavior/Discharge Planning: Goal: Ability to manage health-related needs will improve Outcome: Progressing   Problem: Metabolic: Goal: Ability to maintain appropriate glucose levels will improve Outcome: Progressing   Problem: Nutritional: Goal: Maintenance of adequate nutrition will improve Outcome: Progressing

## 2023-12-04 NOTE — ED Triage Notes (Signed)
 Patient BIB [facility] from home due to chest pain. Patient reports chest pain for 1 month but today was worse and cough was worse. Patient does not normally wear O2 and is requiring 3L. EMS reports fever however upon arrival patient is afebrile. EMS reports rhonchi in bilateral lungs. Patient states pain is 4/10. No nitroglycerin given, 324 aspirin. Patient A&Ox4.

## 2023-12-04 NOTE — Progress Notes (Signed)
 PROGRESS NOTE    Dakota Lewis  ZOX:[id number] DOB: 1933/04/22 DOA: 12/04/2023 PCP: Shirline Frees, NP  Outpatient Specialists:     Brief Narrative:  Patient is an 89 year old male, past medical history significant for type 2 diabetes mellitus, hypertension, osteoarthritis, hyperlipidemia, liver cirrhosis without ascites, history of prostate cancer, gout, pulmonary embolism and DVT on Eliquis.  Patient was admitted earlier today with community-acquired pneumonia, elevated troponin (type II elevation), questionable liver mass (will need CT abdomen and pelvis when renal function is back to baseline) and acute kidney injury.  12/04/2023: Patient seen alongside patient's daughter.  Patient reports significant improvement.  No new complaints today.  Continue antibiotics.   Assessment & Plan:   Principal Problem:   CAP (community acquired pneumonia) Active Problems:   Essential hypertension   AKI (acute kidney injury) (HCC)   Non-insulin dependent type 2 diabetes mellitus (HCC)   History of prostate cancer   Hyperlipidemia   History of compensated hepatic cirrhosis (HCC)   History of pulmonary embolism   Community acquired pneumonia   Chronic diastolic CHF (congestive heart failure) (HCC)   Heterogeneous opacity of the liver-questionable mass     DVT prophylaxis: Eliquis Code Status: Full code Family Communication: Daughter. Disposition Plan: Inpatient.  Multiple severe comorbidities.   Consultants:  None.  Procedures:  None.  Antimicrobials:  IV Rocephin 2 g daily. IV doxycycline 100 Mg twice daily.   Subjective: No new complaints.  Objective: Vitals:   12/04/23 1400 12/04/23 1415 12/04/23 1430 12/04/23 1434  BP: (!) 143/65 139/71 (!) 144/63   Pulse:  96 93   Resp:   (!) 26   Temp:    99 F (37.2 C)  TempSrc:    Oral  SpO2:  98% 98%   Weight:      Height:        Intake/Output Summary (Last 24 hours) at 12/04/2023 1637 Last data filed at 12/04/2023  0945 Gross per 24 hour  Intake 1402.2 ml  Output 200 ml  Net 1202.2 ml   Filed Weights   12/04/23 0253  Weight: 81.6 kg    Examination:  General exam: Appears calm and comfortable  Respiratory system: Clear to auscultation.  Cardiovascular system: S1 & S2 heard Gastrointestinal system: Abdomen is obese, soft and nontender.   Central nervous system: Awake and alert.  Moves all extremities.     Data Reviewed: I have personally reviewed following labs and imaging studies  CBC: Recent Labs  Lab 12/03/23 1753 12/04/23 0245  WBC 13.9* 20.7*  NEUTROABS 11.5* 18.0*  HGB 14.5 15.5  HCT 43.4 45.5  MCV 88.9 88.0  PLT 222 214   Basic Metabolic Panel: Recent Labs  Lab 12/03/23 1753 12/04/23 0245  NA 135 132*  K 4.3 4.0  CL 99 94*  CO2 23 22  GLUCOSE 240* 205*  BUN 18 18  CREATININE 1.47* 1.36*  CALCIUM 9.5 9.9   GFR: Estimated Creatinine Clearance: 38.4 mL/min (A) (by C-G formula based on SCr of 1.36 mg/dL (H)). Liver Function Tests: Recent Labs  Lab 12/04/23 0245  AST 30  ALT 30  ALKPHOS 81  BILITOT 1.4*  PROT 8.4*  ALBUMIN 3.9   No results for input(s): "LIPASE", "AMYLASE" in the last 168 hours. No results for input(s): "AMMONIA" in the last 168 hours. Coagulation Profile: Recent Labs  Lab 12/04/23 0245  INR 1.5*   Cardiac Enzymes: No results for input(s): "CKTOTAL", "CKMB", "CKMBINDEX", "TROPONINI" in the last 168 hours. BNP (last 3 results) No results  for input(s): "PROBNP" in the last 8760 hours. HbA1C: No results for input(s): "HGBA1C" in the last 72 hours. CBG: Recent Labs  Lab 12/04/23 0807 12/04/23 1218  GLUCAP 147* 147*   Lipid Profile: No results for input(s): "CHOL", "HDL", "LDLCALC", "TRIG", "CHOLHDL", "LDLDIRECT" in the last 72 hours. Thyroid Function Tests: No results for input(s): "TSH", "T4TOTAL", "FREET4", "T3FREE", "THYROIDAB" in the last 72 hours. Anemia Panel: No results for input(s): "VITAMINB12", "FOLATE", "FERRITIN",  "TIBC", "IRON", "RETICCTPCT" in the last 72 hours. Urine analysis:    Component Value Date/Time   COLORURINE YELLOW 12/04/2023 0609   APPEARANCEUR CLEAR 12/04/2023 0609   LABSPEC 1.023 12/04/2023 0609   PHURINE 5.0 12/04/2023 0609   GLUCOSEU >=500 (A) 12/04/2023 0609   GLUCOSEU NEGATIVE 09/02/2023 1019   HGBUR NEGATIVE 12/04/2023 0609   BILIRUBINUR NEGATIVE 12/04/2023 0609   BILIRUBINUR n 10/22/2017 0848   KETONESUR 20 (A) 12/04/2023 0609   PROTEINUR 30 (A) 12/04/2023 0609   UROBILINOGEN 0.2 09/02/2023 1019   NITRITE NEGATIVE 12/04/2023 0609   LEUKOCYTESUR NEGATIVE 12/04/2023 0609   Sepsis Labs: @LABRCNTIP (procalcitonin:4,lacticidven:4)  ) Recent Results (from the past 240 hours)  Resp panel by RT-PCR (RSV, Flu A&B, Covid) Anterior Nasal Swab     Status: None   Collection Time: 12/03/23  6:05 PM   Specimen: Anterior Nasal Swab  Result Value Ref Range Status   SARS Coronavirus 2 by RT PCR NEGATIVE NEGATIVE Final   Influenza A by PCR NEGATIVE NEGATIVE Final   Influenza B by PCR NEGATIVE NEGATIVE Final    Comment: (NOTE) The Xpert Xpress SARS-CoV-2/FLU/RSV plus assay is intended as an aid in the diagnosis of influenza from Nasopharyngeal swab specimens and should not be used as a sole basis for treatment. Nasal washings and aspirates are unacceptable for Xpert Xpress SARS-CoV-2/FLU/RSV testing.  Fact Sheet for Patients: BloggerCourse.com  Fact Sheet for Healthcare Providers: SeriousBroker.it  This test is not yet approved or cleared by the [place] FDA and has been authorized for detection and/or diagnosis of SARS-CoV-2 by FDA under an Emergency Use Authorization (EUA). This EUA will remain in effect (meaning this test can be used) for the duration of the COVID-19 declaration under Section 564(b)(1) of the Act, 21 U.S.C. section 360bbb-3(b)(1), unless the authorization is terminated or revoked.     Resp Syncytial  Virus by PCR NEGATIVE NEGATIVE Final    Comment: (NOTE) Fact Sheet for Patients: BloggerCourse.com  Fact Sheet for Healthcare Providers: SeriousBroker.it  This test is not yet approved or cleared by the [place] FDA and has been authorized for detection and/or diagnosis of SARS-CoV-2 by FDA under an Emergency Use Authorization (EUA). This EUA will remain in effect (meaning this test can be used) for the duration of the COVID-19 declaration under Section 564(b)(1) of the Act, 21 U.S.C. section 360bbb-3(b)(1), unless the authorization is terminated or revoked.  Performed at [facility], [address], [place], [place] [address]   Blood Culture (routine x 2)     Status: None (Preliminary result)   Collection Time: 12/04/23  3:07 AM   Specimen: BLOOD  Result Value Ref Range Status   Specimen Description BLOOD RIGHT ANTECUBITAL  Final   Special Requests   Final    BOTTLES DRAWN AEROBIC AND ANAEROBIC Blood Culture results may not be optimal due to an inadequate volume of blood received in culture bottles   Culture   Final    NO GROWTH < 12 HOURS Performed at [facility], [address] [place], [place] [address]    Report Status PENDING  Incomplete  Blood Culture (routine x 2)     Status: None (Preliminary result)   Collection Time: 12/04/23  3:19 AM   Specimen: BLOOD LEFT WRIST  Result Value Ref Range Status   Specimen Description BLOOD LEFT WRIST  Final   Special Requests   Final    BOTTLES DRAWN AEROBIC AND ANAEROBIC Blood Culture adequate volume   Culture   Final    NO GROWTH < 12 HOURS Performed at [facility], [address] [place], [place] [address]    Report Status PENDING  Incomplete  Respiratory (~20 pathogens) panel by PCR     Status: Abnormal   Collection Time: 12/04/23  6:09 AM   Specimen: Urine, Clean Catch; Respiratory  Result Value Ref Range Status   Adenovirus NOT DETECTED NOT  DETECTED Final   Coronavirus 229E NOT DETECTED NOT DETECTED Final    Comment: (NOTE) The Coronavirus on the Respiratory Panel, DOES NOT test for the novel  Coronavirus (2019 nCoV)    Coronavirus HKU1 NOT DETECTED NOT DETECTED Final   Coronavirus NL63 DETECTED (A) NOT DETECTED Final   Coronavirus OC43 NOT DETECTED NOT DETECTED Final   Metapneumovirus NOT DETECTED NOT DETECTED Final   Rhinovirus / Enterovirus NOT DETECTED NOT DETECTED Final   Influenza A NOT DETECTED NOT DETECTED Final   Influenza B NOT DETECTED NOT DETECTED Final   Parainfluenza Virus 1 NOT DETECTED NOT DETECTED Final   Parainfluenza Virus 2 NOT DETECTED NOT DETECTED Final   Parainfluenza Virus 3 NOT DETECTED NOT DETECTED Final   Parainfluenza Virus 4 NOT DETECTED NOT DETECTED Final   Respiratory Syncytial Virus NOT DETECTED NOT DETECTED Final   Bordetella pertussis NOT DETECTED NOT DETECTED Final   Bordetella Parapertussis NOT DETECTED NOT DETECTED Final   Chlamydophila pneumoniae NOT DETECTED NOT DETECTED Final   Mycoplasma pneumoniae NOT DETECTED NOT DETECTED Final    Comment: Performed at [facility], [address] [place], [place] [address]  Expectorated Sputum Assessment w Gram Stain, Rflx to Resp Cult     Status: None   Collection Time: 12/04/23  6:38 AM   Specimen: Expectorated Sputum  Result Value Ref Range Status   Specimen Description EXPECTORATED SPUTUM  Final   Special Requests NONE  Final   Sputum evaluation   Final    Sputum specimen not acceptable for testing.  Please recollect.   NOTIFIED RN PAIGE PULLIAM ON 12/04/23 @ 1321 BY DRT Performed at [facility], [address] [place], [place] [address]    Report Status 12/04/2023 FINAL  Final         Radiology Studies: CT Chest Wo Contrast Result Date: 12/04/2023 CLINICAL DATA:  Respiratory illness with nondiagnostic chest radiograph. Chest pain. EXAM: CT CHEST WITHOUT CONTRAST TECHNIQUE: Multidetector CT imaging of the chest was  performed following the standard protocol without IV contrast. RADIATION DOSE REDUCTION: This exam was performed according to the departmental dose-optimization program which includes automated exposure control, adjustment of the mA and/or kV according to patient size and/or use of iterative reconstruction technique. COMPARISON:  08/10/2022 FINDINGS: Cardiovascular: Normal heart size. Aortic atherosclerosis and coronary artery calcifications. Mediastinum/Nodes: Thyroid gland, trachea and esophagus demonstrate no significant findings. No mediastinal adenopathy. Hilar lymph nodes suboptimally evaluated due to lack of IV contrast. Lungs/Pleura: Central airways are patent. No pleural effusion. No signs of pneumothorax. Ground-glass attenuation and consolidative changes identified within the right lower lobe and posterior  medial right middle lobe. Upper Abdomen: No acute abnormality. Heterogeneous appearance of the liver. Focal low-attenuation area within the dome of liver is noted involving segments 8 and 4 a. This area measures approximately 7.5 cm and is suboptimally evaluated due to lack of IV contrast. Musculoskeletal: No chest wall mass or suspicious bone lesions identified. IMPRESSION: 1. Ground-glass attenuation and consolidative changes identified within the right lower lobe and posterior medial right middle lobe. Findings are compatible with multifocal pneumonia. Followup imaging is recommended in 3-4 weeks following trial of antibiotic therapy to ensure resolution and exclude underlying malignancy. 2. Heterogeneous appearance of the liver. Focal low-attenuation area within the dome of liver is noted involving segments 8 and 4 a. This area measures approximately 7.5 cm and is suboptimally evaluated due to lack of IV contrast. Further evaluation with nonemergent abdominal MRI or CT with and without contrast is recommended. 3. Coronary artery calcifications. 4.  Aortic Atherosclerosis (ICD10-I70.0).  Electronically Signed   By: Signa Kell M.D.   On: 12/04/2023 06:08   CT Soft Tissue Neck Wo Contrast Result Date: 12/04/2023 CLINICAL DATA:  89 year old male with chest pain, cough, oxygen requirement, fever, "Neck mass" . EXAM: CT NECK WITHOUT CONTRAST TECHNIQUE: Multidetector CT imaging of the neck was performed following the standard protocol without intravenous contrast. RADIATION DOSE REDUCTION: This exam was performed according to the departmental dose-optimization program which includes automated exposure control, adjustment of the mA and/or kV according to patient size and/or use of iterative reconstruction technique. COMPARISON:  Cervical spine MRI 11/14/2014. FINDINGS: Pharynx and larynx: Negative noncontrast larynx and pharynx. Partially retropharyngeal course of the right carotid, otherwise noncontrast retropharyngeal and parapharyngeal spaces. Salivary glands: Negative noncontrast appearance, sublingual space. Thyroid: Negative for age. Lymph nodes: No cervical lymphadenopathy. Diminutive bilateral cervical lymph nodes. Vascular: Vascular patency is not evaluated in the absence of IV contrast. Partially retropharyngeal right carotid. Bilateral carotid calcified atherosclerosis. Limited intracranial: Calcified atherosclerosis at the skull base. Negative visible noncontrast brain parenchyma. Visualized orbits: Partially visible postoperative changes to the globes. Mastoids and visualized paranasal sinuses: Mild to moderate ethmoid sinus mucosal thickening and opacification. Right maxillary alveolar recess mucosal thickening. Tympanic cavities and mastoids are clear. Skeleton: No acute or suspicious osseous lesion is identified. Cervical spine degeneration. Chronic postinflammatory changes at the posterior left mandible, left wisdom tooth area which is extracted. Upper chest: Calcified aortic atherosclerosis. Mild respiratory motion. Questionable upper lung emphysema. Subglottic trachea is patent.  Visible superior mediastinal lymph nodes are within normal limits. IMPRESSION: 1. No acute or inflammatory process identified in the noncontrast Neck. No neck mass or lymphadenopathy identified. 2. Mild to moderate paranasal sinus inflammation. 3.  Aortic Atherosclerosis (ICD10-I70.0). Electronically Signed   By: Odessa Fleming M.D.   On: 12/04/2023 06:08   DG Chest Port 1 View Result Date: 12/04/2023 CLINICAL DATA:  Possible sepsis EXAM: PORTABLE CHEST 1 VIEW COMPARISON:  12/03/2023 FINDINGS: Cardiac shadow is stable. Aortic calcifications are noted. The lungs are well aerated bilaterally. No focal infiltrate or effusion is seen. No bony abnormality is noted. IMPRESSION: No acute abnormality noted. Electronically Signed   By: Alcide Clever M.D.   On: 12/04/2023 03:01   DG Chest 2 View Result Date: 12/03/2023 CLINICAL DATA:  Cough for several weeks EXAM: CHEST - 2 VIEW COMPARISON:  X-ray 08/10/2022 FINDINGS: Slight elevation of the right hemidiaphragm. No consolidation, pneumothorax or effusion. No edema. Normal cardiopericardial silhouette calcified aorta. Degenerative changes along the spine. Kyphotic x-ray with rotation obscures the right lung apex. IMPRESSION: No  acute cardiopulmonary disease.  Chronic changes. Electronically Signed   By: Karen Kays M.D.   On: 12/03/2023 18:55        Scheduled Meds:  apixaban  5 mg Oral BID   DULoxetine  60 mg Oral Daily   guaiFENesin  600 mg Oral BID   insulin aspart  0-5 Units Subcutaneous QHS   insulin aspart  0-6 Units Subcutaneous TID WC   insulin glargine  10 Units Subcutaneous Daily   pantoprazole  40 mg Oral Daily   sodium chloride flush  3 mL Intravenous Q12H   sodium chloride flush  3 mL Intravenous Q12H   Continuous Infusions:  sodium chloride     [START ON 12/05/2023] cefTRIAXone (ROCEPHIN)  IV     [START ON 12/05/2023] doxycycline (VIBRAMYCIN) IV     lactated ringers 75 mL/hr at 12/04/23 0637     LOS: 0 days    Time spent: 35  minutes.    Berton Mount, MD  [facility] Pager #: [phone] 7PM-7AM contact night coverage as above

## 2023-12-04 NOTE — ED Provider Notes (Signed)
 [facility] DEPT [facility] Emergency Department Provider Note MRN:  [id number]  Arrival date & time: 12/04/23     Chief Complaint   Chest Pain   History of Present Illness   Dakota Lewis is a 89 y.o. year-old male with a history of diabetes, DVT/PE, prostate cancer presenting to the ED with chief complaint of chest pain.  Per family patient was diagnosed with pneumonia back in January and was given a course of antibiotics.  Soon after completing the course, started experiencing cough once again.  Came to the emergency department yesterday, waited several hours and then left without being seen.  This evening became much more sick complaining of chest pain during coughing, was diaphoretic, shortness of breath noted by family.  Febrile with EMS.  Review of Systems  A thorough review of systems was obtained and all systems are negative except as noted in the HPI and PMH.   Patient's Health History    Past Medical History:  Diagnosis Date   Bilateral pulmonary embolism (HCC)    lovenox & coumadin thearpy   Chronic cough    DM (diabetes mellitus) (HCC)    DVT (deep venous thrombosis) (HCC)    left lower   Gastrointestinal bleed    prior   H/O cardiovascular stress test 05/07/2011   normal study, low risk scan   H/O Doppler ultrasound 2013   venous duplex doppler   H/O Doppler ultrasound 2012   venous duplex doppler   H/O echocardiogram 03/14/2011   EF 65-70%   H/O exercise stress test 2006   neg bruce protocol excercise stress test   Hiatal hernia    Hypertension    Prostate cancer [facility])    s/p radiation    Past Surgical History:  Procedure Laterality Date   AMPUTATION Left 03/21/2022   Procedure: LEFT RING FINGER AND LEFT SMALL FINGER REVISION AMPUTATION;  Surgeon: Betha Loa, MD;  Location: [facility] OR;  Service: Orthopedics;  Laterality: Left;   event monitor     2012   HERNIA REPAIR Bilateral    1991, 1992   HIATAL HERNIA REPAIR     1997   IVC FILTER INSERTION      PROSTATE SURGERY      Family History  Problem Relation Age of Onset   Stroke Mother    Kidney disease Father    Heart disease Brother    Arthritis Brother    Heart disease Brother    Prostate cancer Brother     Social History   Socioeconomic History   Marital status: Married    Spouse name: Not on file   Number of children: 3   Years of education: Not on file   Highest education level: Not on file  Occupational History   Not on file  Tobacco Use   Smoking status: Former    Current packs/day: 0.75    Average packs/day: 0.8 packs/day for 20.0 years (15.0 ttl pk-yrs)    Types: Cigarettes    Passive exposure: Past   Smokeless tobacco: Never  Vaping Use   Vaping status: Never Used  Substance and Sexual Activity   Alcohol use: No   Drug use: No   Sexual activity: Not on file  Other Topics Concern   Not on file  Social History Narrative   Not on file   Social Drivers of Health   Financial Resource Strain: Low Risk  (12/20/2022)   Overall Financial Resource Strain (CARDIA)    Difficulty of Paying Living Expenses: Not  hard at all  Food Insecurity: No Food Insecurity (12/20/2022)   Hunger Vital Sign    Worried About Running Out of Food in the Last Year: Never true    Ran Out of Food in the Last Year: Never true  Transportation Needs: No Transportation Needs (12/20/2022)   PRAPARE - Administrator, Civil Service (Medical): No    Lack of Transportation (Non-Medical): No  Physical Activity: Insufficiently Active (12/20/2022)   Exercise Vital Sign    Days of Exercise per Week: 5 days    Minutes of Exercise per Session: 20 min  Stress: No Stress Concern Present (12/20/2022)   [employer] of Occupational Health - Occupational Stress Questionnaire    Feeling of Stress : Not at all  Social Connections: Moderately Integrated (12/20/2022)   Social Connection and Isolation Panel [NHANES]    Frequency of Communication with Friends and Family: More than three  times a week    Frequency of Social Gatherings with Friends and Family: More than three times a week    Attends Religious Services: More than 4 times per year    Active Member of [employer] or Organizations: Yes    Attends Banker Meetings: More than 4 times per year    Marital Status: Widowed  Intimate Partner Violence: Not At Risk (12/20/2022)   Humiliation, Afraid, Rape, and Kick questionnaire    Fear of Current or Ex-Partner: No    Emotionally Abused: No    Physically Abused: No    Sexually Abused: No     Physical Exam   Vitals:   12/04/23 0415 12/04/23 0430  BP: (!) 140/75 (!) 144/67  Pulse: (!) 107 (!) 105  Resp: 19 20  Temp:    SpO2: 94% 93%    CONSTITUTIONAL: Well-appearing, NAD NEURO/PSYCH:  Alert and oriented x 3, no focal deficits EYES:  eyes equal and reactive ENT/NECK:  no LAD, no JVD CARDIO: Tachycardic rate, well-perfused, normal S1 and S2 PULM:  CTAB no wheezing or rhonchi GI/GU:  non-distended, non-tender MSK/SPINE:  No gross deformities, no edema SKIN:  no rash, atraumatic   *Additional and/or pertinent findings included in MDM below  Diagnostic and Interventional Summary    EKG Interpretation Date/Time:  December 04, 2023 at 00:45:16 Ventricular Rate:   124 PR Interval:    QRS Duration:   140 QT Interval:   417 QTC Calculation:  599 R Axis:      Text Interpretation: Sinus rhythm, right bundle branch block, prolonged QT       Labs Reviewed  COMPREHENSIVE METABOLIC PANEL - Abnormal; Notable for the following components:      Result Value   Sodium 132 (*)    Chloride 94 (*)    Glucose, Bld 205 (*)    Creatinine, Ser 1.36 (*)    Total Protein 8.4 (*)    Total Bilirubin 1.4 (*)    GFR, Estimated 50 (*)    Anion gap 16 (*)    All other components within normal limits  CBC WITH DIFFERENTIAL/PLATELET - Abnormal; Notable for the following components:   WBC 20.7 (*)    Neutro Abs 18.0 (*)    Lymphs Abs 0.4 (*)    Monocytes Absolute  2.1 (*)    Abs Immature Granulocytes 0.20 (*)    All other components within normal limits  PROTIME-INR - Abnormal; Notable for the following components:   Prothrombin Time 18.0 (*)    INR 1.5 (*)    All other components within  normal limits  URINALYSIS, W/ REFLEX TO CULTURE (INFECTION SUSPECTED) - Abnormal; Notable for the following components:   Glucose, UA >=500 (*)    Ketones, ur 20 (*)    Protein, ur 30 (*)    Bacteria, UA RARE (*)    All other components within normal limits  TROPONIN I (HIGH SENSITIVITY) - Abnormal; Notable for the following components:   Troponin I (High Sensitivity) 18 (*)    All other components within normal limits  CULTURE, BLOOD (ROUTINE X 2)  CULTURE, BLOOD (ROUTINE X 2)  APTT  I-STAT CG4 LACTIC ACID, ED  TROPONIN I (HIGH SENSITIVITY)    DG Chest Port 1 View  Final Result    CT Chest Wo Contrast    (Results Pending)  CT Soft Tissue Neck Wo Contrast    (Results Pending)    Medications  magnesium sulfate IVPB 2 g 50 mL (0 g Intravenous Stopped 12/04/23 0436)  cefTRIAXone (ROCEPHIN) 2 g in sodium chloride 0.9 % 100 mL IVPB (0 g Intravenous Stopped 12/04/23 0405)  azithromycin (ZITHROMAX) 500 mg in sodium chloride 0.9 % 250 mL IVPB (0 mg Intravenous Stopped 12/04/23 0519)  sodium chloride 0.9 % bolus 1,000 mL (0 mLs Intravenous Stopped 12/04/23 0436)     Procedures  /  Critical Care .Critical Care  Performed by: Sabas Sous, MD Authorized by: Sabas Sous, MD   Critical care provider statement:    Critical care time (minutes):  45   Critical care was necessary to treat or prevent imminent or life-threatening deterioration of the following conditions:  Sepsis and respiratory failure   Critical care was time spent personally by me on the following activities:  Development of treatment plan with patient or surrogate, discussions with consultants, evaluation of patient's response to treatment, examination of patient, ordering and review of  laboratory studies, ordering and review of radiographic studies, ordering and performing treatments and interventions, pulse oximetry, re-evaluation of patient's condition and review of old charts   ED Course and Medical Decision Making  Initial Impression and Ddx Given the report of fever and patient's tachycardia, new onset hypoxia, report of recent worsening cough, patient's diaphoretic nature, there is concern for sepsis, likely pulmonary origin.  Fluids also considered.  Code sepsis initiated.  EMS was concerned about patient's EKG over the phone especially given patient's chest pain.  However patient describes chest pain as only occurring during coughing and patient has right bundle branch block on EKG, not meeting STEMI criteria, will follow-up troponin.  History of PE, anticoagulated and compliant with medication.  PE felt to be unlikely during this acute illness.  Past medical/surgical history that increases complexity of ED encounter: Pulmonary embolism, prostate cancer, diabetes  Interpretation of Diagnostics I personally reviewed the EKG and my interpretation is as follows: Bundle branch block, sinus rhythm, long QT  Labs reveal leukocytosis, mildly elevated troponin  Patient Reassessment and Ultimate Disposition/Management     Plan is for hospitalist admission.  Fever is of unclear source at this time though suspect occult pneumonia.  Will obtain CT chest without contrast.  Family explains that at the urgent care chest x-ray was performed and the providers commented on an abnormality seen on the patient's neck and so we will also obtain CT neck for further clarification.  Patient management required discussion with the following services or consulting groups:  Hospitalist Service  Complexity of Problems Addressed Acute illness or injury that poses threat of life of bodily function  Additional Data Reviewed and  Analyzed Further history obtained from: Further history from  spouse/family member  Additional Factors Impacting ED Encounter Risk Consideration of hospitalization  Elmer Sow. Pilar Plate, MD [facility] Emergency Medicine [facility] [email or web address]  Final Clinical Impressions(s) / ED Diagnoses     ICD-10-CM   1. Sepsis, due to unspecified organism, unspecified whether acute organ dysfunction present (HCC)  A41.9     2. Acute respiratory failure with hypoxia (HCC)  J96.01       ED Discharge Orders     None        Discharge Instructions Discussed with and Provided to Patient:   Discharge Instructions   None      Sabas Sous, MD 12/04/23 [phone]

## 2023-12-04 NOTE — H&P (Signed)
 History and Physical    Dakota Lewis ZOX:[id number] DOB: Apr 11, 1933 DOA: 12/04/2023  PCP: Shirline Frees, NP   Patient coming from: Home   Chief Complaint:  Chief Complaint  Patient presents with   Chest Pain   ED TRIAGE note:Patient BIB [facility] from home due to chest pain. Patient reports chest pain for 1 month but today was worse and cough was worse. Patient does not normally wear O2 and is requiring 3L. EMS reports fever however upon arrival patient is afebrile. EMS reports rhonchi in bilateral lungs. Patient states pain is 4/10. No nitroglycerin given, 324 aspirin. Patient A&Ox4.   HPI:  Dakota Lewis is a 89 y.o. 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 Patient reported that he has diagnosed with pneumonia in January and completed antibiotic course.  Soon after completion of the antibiotic patient started coughing.  Patient also complaining about chest pain which occurred mostly during the cough.  Also reported associated shortness of breath.  EMS found patient febrile.  And route to ED patient has been given aspirin.  During my evaluation at the bedside patient denies any chest pain.  Patient reported that he experienced chest pain with cough.  Patient reported high-grade fever at home with productive cough.  Patient also reported he has chronic sore throat but no problem with swallowing difficulty.  Patient denies any palpitation, headache, blurry vision, sinus pain/pressure, runny nose, abdominal pain, constipation, diarrhea, nausea and vomiting.  No other complaint at this time.  ED Course:  At presentation to ED patient is hypertensive blood  pressure noted up to 151/109, tachycardic 123 which has been improved to 105 O2 sat 92% on 3 L oxygen.  Respiratory rate between 20-28. High sensitive troponin 7 and 8. Respiratory panel negative for COVID, flu and RSV. BMP showing elevated blood glucose 240 and elevated creatinine 1.47.  Otherwise unremarkable. CBC showing leukocytosis 13.9. Repeat CMP showing low sodium 134 and creatinine has been improved to 1.3 and elevated bilirubin 1.4 and her anion gap 16. Elevated pro time and INR.   Normal lactic acid level 1.8. Blood cultures are in process. UA showed elevated blood close above 500, ketone positive, protein positive and rare bacteria.  EKG showing normal sinus rhythm heart rate 124, right bundle branch block.  Chest x-ray no acute disease process. CT chest and CT soft tissue neck is pending.  In the ED patient has been given IV ceftriaxone, azithromycin and 1 L of NS bolus.  In the ED code sepsis has been initiated however patient's lactic acid 1.2 within normal range.  Patient O2 sat also dropped to lower 90s range and currently 93% on 3 L oxygen. CT chest showing right-sided lower lobe pneumonia.  Pending CT neck study result.  CT neck was obtained as patient's family was worried that he has some ongoing soft tissue neck problem that was told to him by the urgent care.  Hospitalist has been consulted for further evaluation management of community-acquired pneumonia and AKI.  Given patient has normal lactic acid level discontinuing code sepsis.  Significant labs in the ED: Lab Orders         Blood Culture (routine x 2)         Respiratory (~20 pathogens) panel by PCR  Expectorated Sputum Assessment w Gram Stain, Rflx to Resp Cult         Comprehensive metabolic panel         CBC with Differential         Protime-INR         APTT         Urinalysis, w/ Reflex to Culture (Infection Suspected) -Urine, Clean Catch         Legionella Pneumophila Serogp 1 Ur Ag          CBC         Comprehensive metabolic panel         Procalcitonin         CBC         Comprehensive metabolic panel         Urinalysis, Routine w reflex microscopic -Urine, Clean Catch         Sodium, urine, random         Creatinine, urine, random         Strep pneumoniae urinary antigen       Review of Systems:  Review of Systems  Constitutional:  Positive for fever and malaise/fatigue. Negative for chills.  Respiratory:  Positive for cough and sputum production. Negative for shortness of breath and wheezing.   Cardiovascular:  Negative for chest pain, palpitations, orthopnea and leg swelling.  Gastrointestinal:  Negative for abdominal pain, diarrhea, heartburn, nausea and vomiting.  Musculoskeletal:  Negative for falls, joint pain, myalgias and neck pain.  Neurological:  Negative for dizziness and headaches.  Psychiatric/Behavioral:  The patient is not nervous/anxious.   All other systems reviewed and are negative.   Past Medical History:  Diagnosis Date   Bilateral pulmonary embolism (HCC)    lovenox & coumadin thearpy   Chronic cough    DM (diabetes mellitus) (HCC)    DVT (deep venous thrombosis) (HCC)    left lower   Gastrointestinal bleed    prior   H/O cardiovascular stress test 05/07/2011   normal study, low risk scan   H/O Doppler ultrasound 2013   venous duplex doppler   H/O Doppler ultrasound 2012   venous duplex doppler   H/O echocardiogram 03/14/2011   EF 65-70%   H/O exercise stress test 2006   neg bruce protocol excercise stress test   Hiatal hernia    Hypertension    Prostate cancer [facility])    s/p radiation    Past Surgical History:  Procedure Laterality Date   AMPUTATION Left 03/21/2022   Procedure: LEFT RING FINGER AND LEFT SMALL FINGER REVISION AMPUTATION;  Surgeon: Betha Loa, MD;  Location: [facility] OR;  Service: Orthopedics;  Laterality: Left;   event monitor     2012   HERNIA REPAIR Bilateral    1991, 1992   HIATAL HERNIA REPAIR     1997   IVC  FILTER INSERTION     PROSTATE SURGERY       reports that he has quit smoking. His smoking use included cigarettes. He has a 15 pack-year smoking history. He has been exposed to tobacco smoke. He has never used smokeless tobacco. He reports that he does not drink alcohol and does not use drugs.  Allergies  Allergen Reactions   Lidocaine     ANTI ITCH CREAM, NO SPECIFICATIONS IN RECORDS.   Lisinopril     cough   Metformin Diarrhea    Family History  Problem Relation Age of Onset   Stroke Mother    Kidney disease  Father    Heart disease Brother    Arthritis Brother    Heart disease Brother    Prostate cancer Brother     Prior to Admission medications   Medication Sig Start Date End Date Taking? Authorizing Provider  Accu-Chek Softclix Lancets lancets USE 1 DAILY 11/07/23   Nafziger, Kandee Keen, NP  acetaminophen (TYLENOL) 500 MG tablet Take by mouth as needed.    [provider]  amLODipine (NORVASC) 10 MG tablet Take 1 tablet (10 mg total) by mouth daily. 09/24/23   Nafziger, Kandee Keen, NP  apixaban (ELIQUIS) 5 MG TABS tablet Take 5 mg by mouth 2 (two) times daily.    [provider]  Ascorbic Acid (VITAMIN C) 1000 MG tablet Take 1 tablet by mouth daily.    [provider]  blood glucose meter kit and supplies KIT Dispense based on patient and insurance preference. Use up to four times daily as directed. 07/18/21   Nafziger, Kandee Keen, NP  Blood Glucose Monitoring Suppl DEVI 1 each by Does not apply route in the morning, at noon, and at bedtime. May substitute to any manufacturer covered by patient's insurance. 11/04/23   Nafziger, Kandee Keen, NP  Carboxymethylcellulose Sod PF (THERATEARS PF) 0.25 % SOLN INSTILL 1 DROP IN BOTH EYES FOUR TIMES A DAY AS NEEDED 05/01/21   [provider]  Cholecalciferol 50 MCG (2000 UT) CAPS 1 capsule Orally Once a day    [provider]  clotrimazole-betamethasone (LOTRISONE) cream Apply 1 application. topically daily. Thin layer  twice a day 02/27/22   Shirline Frees, NP  DULoxetine (CYMBALTA) 60 MG capsule Take 1 capsule (60 mg total) by mouth daily. 11/04/23   Nafziger, Kandee Keen, NP  empagliflozin (JARDIANCE) 25 MG TABS tablet Take by mouth daily. Taking 1/2 tab daily    [provider]  fluticasone (FLONASE) 50 MCG/ACT nasal spray Place into the nose as needed. 02/27/22   [provider]  Glucose Blood (BLOOD GLUCOSE TEST STRIPS) STRP 1 each by In Vitro route in the morning, at noon, and at bedtime. May substitute to any manufacturer covered by patient's insurance. 11/04/23 12/04/23  Shirline Frees, NP  Lancet Device MISC 1 each by Does not apply route in the morning, at noon, and at bedtime. May substitute to any manufacturer covered by patient's insurance. 11/04/23 12/04/23  Shirline Frees, NP  Lancets Misc. MISC 1 each by Does not apply route in the morning, at noon, and at bedtime. May substitute to any manufacturer covered by patient's insurance. 11/04/23 12/04/23  Nafziger, Kandee Keen, NP  LANTUS SOLOSTAR 100 UNIT/ML Solostar Pen Inject 10 Units into the skin daily. Patient taking differently: Inject 5 Units into the skin daily. 03/13/23   Nafziger, Kandee Keen, NP  omeprazole (PRILOSEC) 20 MG capsule Take 1 capsule (20 mg total) by mouth daily. 05/27/23   Nafziger, Kandee Keen, NP  polyethylene glycol (MIRALAX / GLYCOLAX) 17 g packet TAKE 17 GRAMS BY MOUTH DAILY 06/15/21   [provider]  potassium chloride (MICRO-K) 10 MEQ CR capsule Take 1 capsule by mouth daily. SCHEDULE OFFICE VISIT FOR FUTURE REFILLS 10/24/23   Nafziger, Kandee Keen, NP  TECHLITE PEN NEEDLES 32G X 6 MM MISC USE ONCE DAILY 09/23/23   Nafziger, Kandee Keen, NP  torsemide (DEMADEX) 5 MG tablet Take 1 tablet (5 mg total) by mouth daily. 09/24/23   Nafziger, Kandee Keen, NP  valsartan (DIOVAN) 160 MG tablet Take 1 tablet (160 mg total) by mouth daily. 11/04/23   Shirline Frees, NP     Physical Exam: Vitals:  12/04/23 0405 12/04/23 0415 12/04/23 0430 12/04/23 0638  BP: (!)  140/73 (!) 140/75 (!) 144/67   Pulse: (!) 105 (!) 107 (!) 105   Resp: (!) 22 19 20    Temp:    98.8 F (37.1 C)  TempSrc:    Oral  SpO2: 93% 94% 93%   Weight:      Height:        Physical Exam Vitals and nursing note reviewed.  Constitutional:      Appearance: He is well-developed.  Eyes:     Pupils: Pupils are equal, round, and reactive to light.  Cardiovascular:     Rate and Rhythm: Normal rate and regular rhythm.     Heart sounds: Normal heart sounds.  Pulmonary:     Effort: Pulmonary effort is normal. No tachypnea.     Breath sounds: Normal breath sounds. No wheezing, rhonchi or rales.  Abdominal:     General: Bowel sounds are normal.     Palpations: Abdomen is soft. There is no hepatomegaly, splenomegaly or mass.     Tenderness: There is no abdominal tenderness.  Musculoskeletal:     Right lower leg: No tenderness.     Left lower leg: No tenderness.  Skin:    General: Skin is dry.     Capillary Refill: Capillary refill takes less than 2 seconds.  Neurological:     Mental Status: He is alert and oriented to person, place, and time.  Psychiatric:        Mood and Affect: Mood normal. Mood is not anxious.      Labs on Admission: I have personally reviewed following labs and imaging studies  CBC: Recent Labs  Lab 12/03/23 1753 12/04/23 0245  WBC 13.9* 20.7*  NEUTROABS 11.5* 18.0*  HGB 14.5 15.5  HCT 43.4 45.5  MCV 88.9 88.0  PLT 222 214   Basic Metabolic Panel: Recent Labs  Lab 12/03/23 1753 12/04/23 0245  NA 135 132*  K 4.3 4.0  CL 99 94*  CO2 23 22  GLUCOSE 240* 205*  BUN 18 18  CREATININE 1.47* 1.36*  CALCIUM 9.5 9.9   GFR: Estimated Creatinine Clearance: 38.4 mL/min (A) (by C-G formula based on SCr of 1.36 mg/dL (H)). Liver Function Tests: Recent Labs  Lab 12/04/23 0245  AST 30  ALT 30  ALKPHOS 81  BILITOT 1.4*  PROT 8.4*  ALBUMIN 3.9   No results for input(s): "LIPASE", "AMYLASE" in the last 168 hours. No results for input(s):  "AMMONIA" in the last 168 hours. Coagulation Profile: Recent Labs  Lab 12/04/23 0245  INR 1.5*   Cardiac Enzymes: Recent Labs  Lab 12/03/23 1753 12/03/23 2008 12/04/23 0245 12/04/23 0515  TROPONINIHS 7 8 18* 18*   BNP (last 3 results) No results for input(s): "BNP" in the last 8760 hours. HbA1C: No results for input(s): "HGBA1C" in the last 72 hours. CBG: No results for input(s): "GLUCAP" in the last 168 hours. Lipid Profile: No results for input(s): "CHOL", "HDL", "LDLCALC", "TRIG", "CHOLHDL", "LDLDIRECT" in the last 72 hours. Thyroid Function Tests: No results for input(s): "TSH", "T4TOTAL", "FREET4", "T3FREE", "THYROIDAB" in the last 72 hours. Anemia Panel: No results for input(s): "VITAMINB12", "FOLATE", "FERRITIN", "TIBC", "IRON", "RETICCTPCT" in the last 72 hours. Urine analysis:    Component Value Date/Time   COLORURINE YELLOW 12/04/2023 0334   APPEARANCEUR CLEAR 12/04/2023 0334   LABSPEC 1.022 12/04/2023 0334   PHURINE 5.0 12/04/2023 0334   GLUCOSEU >=500 (A) 12/04/2023 0334   GLUCOSEU NEGATIVE 09/02/2023 1019  HGBUR NEGATIVE 12/04/2023 0334   BILIRUBINUR NEGATIVE 12/04/2023 0334   BILIRUBINUR n 10/22/2017 0848   KETONESUR 20 (A) 12/04/2023 0334   PROTEINUR 30 (A) 12/04/2023 0334   UROBILINOGEN 0.2 09/02/2023 1019   NITRITE NEGATIVE 12/04/2023 0334   LEUKOCYTESUR NEGATIVE 12/04/2023 0334    Radiological Exams on Admission: I have personally reviewed images CT Chest Wo Contrast Result Date: 12/04/2023 CLINICAL DATA:  Respiratory illness with nondiagnostic chest radiograph. Chest pain. EXAM: CT CHEST WITHOUT CONTRAST TECHNIQUE: Multidetector CT imaging of the chest was performed following the standard protocol without IV contrast. RADIATION DOSE REDUCTION: This exam was performed according to the departmental dose-optimization program which includes automated exposure control, adjustment of the mA and/or kV according to patient size and/or use of iterative  reconstruction technique. COMPARISON:  08/10/2022 FINDINGS: Cardiovascular: Normal heart size. Aortic atherosclerosis and coronary artery calcifications. Mediastinum/Nodes: Thyroid gland, trachea and esophagus demonstrate no significant findings. No mediastinal adenopathy. Hilar lymph nodes suboptimally evaluated due to lack of IV contrast. Lungs/Pleura: Central airways are patent. No pleural effusion. No signs of pneumothorax. Ground-glass attenuation and consolidative changes identified within the right lower lobe and posterior medial right middle lobe. Upper Abdomen: No acute abnormality. Heterogeneous appearance of the liver. Focal low-attenuation area within the dome of liver is noted involving segments 8 and 4 a. This area measures approximately 7.5 cm and is suboptimally evaluated due to lack of IV contrast. Musculoskeletal: No chest wall mass or suspicious bone lesions identified. IMPRESSION: 1. Ground-glass attenuation and consolidative changes identified within the right lower lobe and posterior medial right middle lobe. Findings are compatible with multifocal pneumonia. Followup imaging is recommended in 3-4 weeks following trial of antibiotic therapy to ensure resolution and exclude underlying malignancy. 2. Heterogeneous appearance of the liver. Focal low-attenuation area within the dome of liver is noted involving segments 8 and 4 a. This area measures approximately 7.5 cm and is suboptimally evaluated due to lack of IV contrast. Further evaluation with nonemergent abdominal MRI or CT with and without contrast is recommended. 3. Coronary artery calcifications. 4.  Aortic Atherosclerosis (ICD10-I70.0). Electronically Signed   By: Signa Kell M.D.   On: 12/04/2023 06:08   CT Soft Tissue Neck Wo Contrast Result Date: 12/04/2023 CLINICAL DATA:  89 year old male with chest pain, cough, oxygen requirement, fever, "Neck mass" . EXAM: CT NECK WITHOUT CONTRAST TECHNIQUE: Multidetector CT imaging of the  neck was performed following the standard protocol without intravenous contrast. RADIATION DOSE REDUCTION: This exam was performed according to the departmental dose-optimization program which includes automated exposure control, adjustment of the mA and/or kV according to patient size and/or use of iterative reconstruction technique. COMPARISON:  Cervical spine MRI 11/14/2014. FINDINGS: Pharynx and larynx: Negative noncontrast larynx and pharynx. Partially retropharyngeal course of the right carotid, otherwise noncontrast retropharyngeal and parapharyngeal spaces. Salivary glands: Negative noncontrast appearance, sublingual space. Thyroid: Negative for age. Lymph nodes: No cervical lymphadenopathy. Diminutive bilateral cervical lymph nodes. Vascular: Vascular patency is not evaluated in the absence of IV contrast. Partially retropharyngeal right carotid. Bilateral carotid calcified atherosclerosis. Limited intracranial: Calcified atherosclerosis at the skull base. Negative visible noncontrast brain parenchyma. Visualized orbits: Partially visible postoperative changes to the globes. Mastoids and visualized paranasal sinuses: Mild to moderate ethmoid sinus mucosal thickening and opacification. Right maxillary alveolar recess mucosal thickening. Tympanic cavities and mastoids are clear. Skeleton: No acute or suspicious osseous lesion is identified. Cervical spine degeneration. Chronic postinflammatory changes at the posterior left mandible, left wisdom tooth area which is extracted. Upper chest:  Calcified aortic atherosclerosis. Mild respiratory motion. Questionable upper lung emphysema. Subglottic trachea is patent. Visible superior mediastinal lymph nodes are within normal limits. IMPRESSION: 1. No acute or inflammatory process identified in the noncontrast Neck. No neck mass or lymphadenopathy identified. 2. Mild to moderate paranasal sinus inflammation. 3.  Aortic Atherosclerosis (ICD10-I70.0). Electronically  Signed   By: Odessa Fleming M.D.   On: 12/04/2023 06:08   DG Chest Port 1 View Result Date: 12/04/2023 CLINICAL DATA:  Possible sepsis EXAM: PORTABLE CHEST 1 VIEW COMPARISON:  12/03/2023 FINDINGS: Cardiac shadow is stable. Aortic calcifications are noted. The lungs are well aerated bilaterally. No focal infiltrate or effusion is seen. No bony abnormality is noted. IMPRESSION: No acute abnormality noted. Electronically Signed   By: Alcide Clever M.D.   On: 12/04/2023 03:01   DG Chest 2 View Result Date: 12/03/2023 CLINICAL DATA:  Cough for several weeks EXAM: CHEST - 2 VIEW COMPARISON:  X-ray 08/10/2022 FINDINGS: Slight elevation of the right hemidiaphragm. No consolidation, pneumothorax or effusion. No edema. Normal cardiopericardial silhouette calcified aorta. Degenerative changes along the spine. Kyphotic x-ray with rotation obscures the right lung apex. IMPRESSION: No acute cardiopulmonary disease.  Chronic changes. Electronically Signed   By: Karen Kays M.D.   On: 12/03/2023 18:55     EKG: My personal interpretation of EKG shows: Sinus rhythm heart rate 124, right bundle branch block.    Assessment/Plan: Principal Problem:   CAP (community acquired pneumonia) Active Problems:   AKI (acute kidney injury) (HCC)   Essential hypertension   Non-insulin dependent type 2 diabetes mellitus (HCC)   History of prostate cancer   Hyperlipidemia   History of compensated hepatic cirrhosis (HCC)   History of pulmonary embolism   Community acquired pneumonia   Chronic diastolic CHF (congestive heart failure) (HCC)   Heterogeneous opacity of the liver-questionable mass    Assessment and Plan: Community-acquired pneumonia -Patient presenting emergency department chest pain for 1 month which has been progressively getting worse with associated cough and fever at home.  EMS found patient has high temperature.  At presentation to ED patient is tachycardic O2 sats in low 90s at the room air currently  maintaining 92 to 93% on 3 L oxygen.  Patient reported he has episodes of pneumonia few weeks ago completed course of antibiotic but since the antibiotic course was completed he noticed worsening cough. -CBC showing elevated WBC count 20.7.  Normal lactic acid level 1.8.  Discontinued code sepsis given patient has normal lactic acid level.  Patient is hemodynamically stable. - Pending culture, sputum culture, urine Legionella and urine, urine strep antigen.  Pending respiratory panel. -Chest x-ray no evidence of pneumonia. -Given patient reported that the urgent care said patient has some neck lymphadenopathy CT soft tissue neck has been obtained in the ED. -CT scan showing groundglass attenuation right lower lobe and posterior medial lobe finding consistent multifocal pneumonia.  Recommended imaging 3 to 4 weeks following trial of antibiotic therapy to exclude underlying malignancy. -CT soft tissue neck no lymphadenopathy or mass lesion.  Moderate paranasal sinus inflammation. -Based on my assessment of reading chest CT it is seems like that patient has right lower lobe pneumonia. - Continue IV ceftriaxone 2 g daily and doxycycline 100 mg twice daily - Will follow-up with culture result for antibiotic guidance.  Elevated troponin secondary to demand ischemia - Patient complaining about chest pain which has been ongoing for 1 month.  Reported mid substernal chest pain that gets worse with cough. - EKG showing  normal sinus rhythm and right bundle branch block. - Troponin 7 and 8 which has been trended up to 18. - Elevated troponin secondary to demand ischemia in the context of pneumonia and new oxygen requirement.  -There is no concern for acute coronary syndrome - Continue to trend troponin and monitor for development of chest pain. -In route to ED via EMS patient has been given aspirin 324 mg. Given there is no concern for acute coronary syndrome holding further aspirin at this  point.   Heterogenous opacity of the liver-questionable mass History of chronic compensated hepatic cirrhosis. - CT chest showing heterogenous appearance of the liver.  Focal low-attenuation area within the dome of the liver is noted involving 2 segments.  This area measures approximate 7.5 cm.  Recommended not emergent MRI and CT abdomen with and without contrast. - Given patient has an acute kidney injury holding further CT abdomen pelvis with contrast at this time.  Can be obtained after 24 hours once the infection improves. -Benign abdominal exam finding - Normal AST/ALT and ALP level.  Slightly elevated bilirubin 1.7. -will need CT abdomen pelvis with and without contrast after 24 hours.    Prerenal acute kidney injury -Elevated creatinine 4 7 which is improved to 1.36.  Prerenal acute kidney injury in the setting of new requirement of oxygen from pneumonia and sinus tachycardia.  Blood pressure borderline elevated. - In the ED patient has been given 1 L of NS bolus - Continue LR 75 cc/h. -UA, urine creatinine and urine sodium - Monitor urine output, renally adjust medications and avoid nephrotoxic agents.  Essential hypertension Grade 2 diastolic heart failure with preserved EF 60 to 65% -Holding Diovan and lisinopril in the setting of AKI - Holding amlodipine given patient has pneumonia and there is concern/risk for development of sepsis develops hypotension.  If patient blood pressure continues to remain Siy upper 160s range in that case can resume amlodipine.  Non-insulin-dependent DM type II -Holding Jardiance in the setting of AKI. -Continue Lantus 10 unit daily and low sliding scale insulin and at bedtime insulin coverage.  Hyperlipidemia -At home patient is not any lipid-lowering agent.  Compensated hepatic cirrhosis Elevated bilirubin -Normal AST/ALT and ALP. -Elevated globin 1.4 in setting of dehydration.  Continue to manage with IV fluid as mentioned  above.  History of pulmonary embolism History of DVT - Patient's daughter at the bedside.  Reported that patient is very compliant with Eliquis.   -Continue Eliquis 5 mg twice daily  History of prostate cancer -In remission   DVT prophylaxis:  Eliquis Code Status:  Full Code Diet: Heart healthy carb modified diet Family Communication:   Family was present at bedside, at the time of interview. Opportunity was given to ask question and all questions were answered satisfactorily.  Disposition Plan: Continue supplemental oxygen and wean down as patient tolerates to room air. Consults: None at this time Admission status:   Inpatient, Telemetry bed  Severity of Illness: The appropriate patient status for this patient is INPATIENT. Inpatient status is judged to be reasonable and necessary in order to provide the required intensity of service to ensure the patient's safety. The patient's presenting symptoms, physical exam findings, and initial radiographic and laboratory data in the context of their chronic comorbidities is felt to place them at high risk for further clinical deterioration. Furthermore, it is not anticipated that the patient will be medically stable for discharge from the hospital within 2 midnights of admission.   * I certify that  at the point of admission it is my clinical judgment that the patient will require inpatient hospital care spanning beyond 2 midnights from the point of admission due to high intensity of service, high risk for further deterioration and high frequency of surveillance required.Marland Kitchen    Tereasa Coop, MD [facility]  How to contact the [facility] Attending or Consulting provider 7A - 7P or covering provider during after hours 7P -7A, for this patient.  Check the care team in [facility] and look for a) attending/consulting TR[facility] provider listed and b) the [facility] team listed Log into [email or web address] and use [place]'s universal password to access. If you do not have  the password, please contact the hospital operator. Locate the [facility] provider you are looking for under [facility] and page to a number that you can be directly reached. If you still have difficulty reaching the provider, please page the [facility] (Director on Call) for the Hospitalists listed on amion for assistance.  12/04/2023, 6:45 AM

## 2023-12-04 NOTE — Sepsis Progress Note (Signed)
 Elink monitoring for the code sepsis protocol.

## 2023-12-05 DIAGNOSIS — J189 Pneumonia, unspecified organism: Secondary | ICD-10-CM | POA: Diagnosis not present

## 2023-12-05 LAB — RENAL FUNCTION PANEL
Albumin: 2.9 g/dL — ABNORMAL LOW (ref 3.5–5.0)
Anion gap: 16 — ABNORMAL HIGH (ref 5–15)
BUN: 18 mg/dL (ref 8–23)
CO2: 23 mmol/L (ref 22–32)
Calcium: 9.1 mg/dL (ref 8.9–10.3)
Chloride: 96 mmol/L — ABNORMAL LOW (ref 98–111)
Creatinine, Ser: 1.06 mg/dL (ref 0.61–1.24)
GFR, Estimated: >60 mL/min (ref >60)
Glucose, Bld: 116 mg/dL — ABNORMAL HIGH (ref 70–99)
Phosphorus: 1.9 mg/dL — ABNORMAL LOW (ref 2.5–4.6)
Potassium: 4 mmol/L (ref 3.5–5.1)
Sodium: 135 mmol/L (ref 135–145)

## 2023-12-05 LAB — CBC WITH DIFFERENTIAL/PLATELET
Abs Immature Granulocytes: 0.12 10*3/uL — ABNORMAL HIGH (ref 0.00–0.07)
Basophils Absolute: 0 10*3/uL (ref 0.0–0.1)
Basophils Relative: 0 %
Eosinophils Absolute: 0.1 10*3/uL (ref 0.0–0.5)
Eosinophils Relative: 1 %
HCT: 36.2 % — ABNORMAL LOW (ref 39.0–52.0)
Hemoglobin: 12.3 g/dL — ABNORMAL LOW (ref 13.0–17.0)
Immature Granulocytes: 1 %
Lymphocytes Relative: 16 %
Lymphs Abs: 1.5 10*3/uL (ref 0.7–4.0)
MCH: 30.1 pg (ref 26.0–34.0)
MCHC: 34 g/dL (ref 30.0–36.0)
MCV: 88.7 fL (ref 80.0–100.0)
Monocytes Absolute: 0.9 10*3/uL (ref 0.1–1.0)
Monocytes Relative: 10 %
Neutro Abs: 6.8 10*3/uL (ref 1.7–7.7)
Neutrophils Relative %: 72 %
Platelets: 159 10*3/uL (ref 150–400)
RBC: 4.08 MIL/uL — ABNORMAL LOW (ref 4.22–5.81)
RDW: 12.5 % (ref 11.5–15.5)
WBC: 9.5 10*3/uL (ref 4.0–10.5)
nRBC: 0 % (ref 0.0–0.2)

## 2023-12-05 LAB — COMPREHENSIVE METABOLIC PANEL
ALT: 21 U/L (ref 0–44)
AST: 20 U/L (ref 15–41)
Albumin: 2.9 g/dL — ABNORMAL LOW (ref 3.5–5.0)
Alkaline Phosphatase: 59 U/L (ref 38–126)
Anion gap: 13 (ref 5–15)
BUN: 18 mg/dL (ref 8–23)
CO2: 24 mmol/L (ref 22–32)
Calcium: 9.1 mg/dL (ref 8.9–10.3)
Chloride: 97 mmol/L — ABNORMAL LOW (ref 98–111)
Creatinine, Ser: 1.03 mg/dL (ref 0.61–1.24)
GFR, Estimated: >60 mL/min (ref >60)
Glucose, Bld: 121 mg/dL — ABNORMAL HIGH (ref 70–99)
Potassium: 4 mmol/L (ref 3.5–5.1)
Sodium: 134 mmol/L — ABNORMAL LOW (ref 135–145)
Total Bilirubin: 0.7 mg/dL (ref 0.0–1.2)
Total Protein: 6.6 g/dL (ref 6.5–8.1)

## 2023-12-05 LAB — MAGNESIUM: Magnesium: 1.9 mg/dL (ref 1.7–2.4)

## 2023-12-05 LAB — GLUCOSE, CAPILLARY
Glucose-Capillary: 123 mg/dL — ABNORMAL HIGH (ref 70–99)
Glucose-Capillary: 127 mg/dL — ABNORMAL HIGH (ref 70–99)
Glucose-Capillary: 101 mg/dL — ABNORMAL HIGH (ref 70–99)
Glucose-Capillary: 163 mg/dL — ABNORMAL HIGH (ref 70–99)

## 2023-12-05 LAB — PHOSPHORUS: Phosphorus: 2.2 mg/dL — ABNORMAL LOW (ref 2.5–4.6)

## 2023-12-05 MED ORDER — K PHOS MONO-SOD PHOS DI & MONO 155-852-130 MG PO TABS: 500.0000 mg | ORAL_TABLET | Freq: Three times a day (TID) | ORAL | 0 refills | Status: DC

## 2023-12-05 MED ORDER — COMBIVENT RESPIMAT 20-100 MCG/ACT IN AERS: 1.0000 | INHALATION_SPRAY | Freq: Four times a day (QID) | RESPIRATORY_TRACT | 0 refills | Status: DC

## 2023-12-05 MED ORDER — K PHOS MONO-SOD PHOS DI & MONO 155-852-130 MG PO TABS
500.0000 mg | ORAL_TABLET | Freq: Three times a day (TID) | ORAL | Status: DC
  Administered 2023-12-05 – 2023-12-06 (×2): 500 mg via ORAL
  Filled 2023-12-05 (×2): qty 2

## 2023-12-05 MED ORDER — IRBESARTAN 150 MG PO TABS
150.0000 mg | ORAL_TABLET | Freq: Every day | ORAL | Status: DC
  Administered 2023-12-05 – 2023-12-06 (×2): 150 mg via ORAL
  Filled 2023-12-05 (×2): qty 1

## 2023-12-05 MED ORDER — DOXYCYCLINE HYCLATE 100 MG PO TABS: 100.0000 mg | ORAL_TABLET | Freq: Two times a day (BID) | ORAL | 0 refills | Status: DC

## 2023-12-05 MED ORDER — AMLODIPINE BESYLATE 5 MG PO TABS
5.0000 mg | ORAL_TABLET | Freq: Every day | ORAL | Status: DC
  Administered 2023-12-05 – 2023-12-06 (×2): 5 mg via ORAL
  Filled 2023-12-05 (×2): qty 1

## 2023-12-05 MED ORDER — DOXYCYCLINE HYCLATE 100 MG PO TABS
100.0000 mg | ORAL_TABLET | Freq: Two times a day (BID) | ORAL | Status: DC
  Administered 2023-12-05 – 2023-12-06 (×2): 100 mg via ORAL
  Filled 2023-12-05 (×2): qty 1

## 2023-12-05 MED ORDER — ACETAMINOPHEN 325 MG PO TABS
650.0000 mg | ORAL_TABLET | Freq: Four times a day (QID) | ORAL | Status: AC | PRN
Start: 2023-12-05 — End: ?

## 2023-12-05 MED ORDER — AMOXICILLIN-POT CLAVULANATE 875-125 MG PO TABS: 1.0000 | ORAL_TABLET | Freq: Two times a day (BID) | ORAL | 0 refills | Status: DC

## 2023-12-05 MED ORDER — QUETIAPINE FUMARATE 25 MG PO TABS: 12.5000 mg | ORAL_TABLET | Freq: Two times a day (BID) | ORAL | Status: DC

## 2023-12-05 NOTE — Plan of Care (Signed)

## 2023-12-05 NOTE — Plan of Care (Signed)
 SATURATION QUALIFICATIONS: (This note is used to comply with regulatory documentation for home oxygen)  Patient Saturations on Room Air at Rest = 96%  Patient Saturations on Room Air while Ambulating = 92%  Patient Saturations on N/A Liters of oxygen while Ambulating = N/A  Please briefly explain why patient needs home oxygen:  No supplemental oxygen was used when performing this walk exam. Patient was able to maintain appropriate oxygen saturations without the use of oxygen while at rest and ambulating in the hallway. Pt denies chest pain, denies feeling lightheaded, denies dizziness. Pt does endorse feeling SOB and audible wheezes are heard at the end of the exam.

## 2023-12-05 NOTE — Progress Notes (Signed)
 PHARMACIST - PHYSICIAN COMMUNICATION DR:   Dartha Lodge CONCERNING: Antibiotic IV to Oral Route Change Policy  RECOMMENDATION: This patient is receiving doxycycline by the intravenous route.  Based on criteria approved by the Pharmacy and Therapeutics Committee, the antibiotic(s) is/are being converted to the equivalent oral dose form(s).   DESCRIPTION: These criteria include: Patient being treated for a respiratory tract infection, urinary tract infection, cellulitis or clostridium difficile associated diarrhea if on metronidazole The patient is not neutropenic and does not exhibit a GI malabsorption state The patient is eating (either orally or via tube) and/or has been taking other orally administered medications for a least 24 hours The patient is improving clinically and has a Tmax < 100.5  If you have questions about this conversion, please contact the Pharmacy Department  []   [phone] )  [facility] []   [phone] )  [facility] [x]   [phone] )  [facility] []   [phone] )  [facility] []   [phone] )  Ilene Qua    Celedonio Miyamoto, PharmD, [place] Clinical Pharmacist Phone [phone]

## 2023-12-05 NOTE — Progress Notes (Signed)
 PROGRESS NOTE    Dakota Lewis  WGN:[id number] DOB: November 21, 1932 DOA: 12/04/2023 PCP: Shirline Frees, NP  Outpatient Specialists:     Brief Narrative:  Patient is an 89 year old male, past medical history significant for type 2 diabetes mellitus, hypertension, osteoarthritis, hyperlipidemia, liver cirrhosis without ascites, history of prostate cancer, gout, pulmonary embolism and DVT on Eliquis.  Patient was admitted earlier today with community-acquired pneumonia, elevated troponin (type II elevation), questionable liver mass (will need CT abdomen and pelvis when renal function is back to baseline) and acute kidney injury.  12/04/2023: Patient seen alongside patient's daughter.  Patient reports significant improvement.  No new complaints today.  Continue antibiotics. 12/05/2023: Patient is improving.  Patient's RIC gastric patient's own.  However, patient's RN reports possible hallucination.  Will start patient on quetiapine 12.5 Mg p.o. twice daily.   Assessment & Plan:   Principal Problem:   CAP (community acquired pneumonia) Active Problems:   Essential hypertension   AKI (acute kidney injury) (HCC)   Non-insulin dependent type 2 diabetes mellitus (HCC)   History of prostate cancer   Hyperlipidemia   History of compensated hepatic cirrhosis (HCC)   History of pulmonary embolism   Community acquired pneumonia   Chronic diastolic CHF (congestive heart failure) (HCC)   Heterogeneous opacity of the liver-questionable mass  Community-acquired pneumonia: -Improving. -Continue antibiotics. -Patient still on oxygen.  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 -Trial of quetiapine 12.5 Mg p.o. twice daily. -Repeat EKG prior to starting quetiapine, as last EKG revealed QTc of 599 ms.  Heterogenous opacity of the liver-questionable mass History of chronic compensated hepatic cirrhosis. - CT chest revealed heterogenous appearance of the liver.  Focal low-attenuation area  within the dome of the liver is noted involving 2 segments.  This area measures approximate 7.5 cm.  Recommended not emergent MRI and CT abdomen with and without contrast. - Given patient had acute kidney injury on presentation, will hold further CT abdomen pelvis with contrast at this time.   - Normal AST/ALT and ALP level.  Slightly elevated bilirubin 1.7. -will need CT abdomen pelvis with and without contrast after 24 hours.  Acute kidney injury: -Resolved.  Hypertension: -Restart amlodipine and valsartan.  Type 2 diabetes mellitus: -Continue to hold Jardiance. -Continue to monitor and optimize.  Hyperlipidemia:  Compensated liver cirrhosis: -Continue to monitor. -Mildly elevated bilirubin.  History of pulmonary embolism/DVT: -Continue Eliquis.  History of prostate cancer: -In remission.  Hypophosphatemia: -Neutra-Phos. -Continue to monitor phosphorus level.  DVT prophylaxis: Eliquis Code Status: Full code Family Communication: Daughter. Disposition Plan: Inpatient.  Multiple severe comorbidities.   Consultants:  None.  Procedures:  None.  Antimicrobials:  IV Rocephin 2 g daily. IV doxycycline 100 Mg twice daily.   Subjective: No new complaints. Possible hallucination.  Objective: Vitals:   12/05/23 0004 12/05/23 0413 12/05/23 0818 12/05/23 1501  BP: (!) 117/90 134/66 (!) 140/76 (!) 154/71  Pulse: 73 85 63 74  Resp: 18 18 16 18   Temp: (!) 97.4 F (36.3 C) 98.7 F (37.1 C) 98.2 F (36.8 C) 97.9 F (36.6 C)  TempSrc: Oral Oral    SpO2: 97% 98% 96% 96%  Weight:      Height:        Intake/Output Summary (Last 24 hours) at 12/05/2023 1753 Last data filed at 12/05/2023 0657 Gross per 24 hour  Intake 1841.37 ml  Output 450 ml  Net 1391.37 ml   Filed Weights   12/04/23 0253  Weight: 81.6 kg    Examination:  General  exam: Appears calm and comfortable  Respiratory system: Clear to auscultation.  Cardiovascular system: S1 & S2  heard Gastrointestinal system: Abdomen is obese, soft and nontender.   Central nervous system: Awake and alert.  Moves all extremities.     Data Reviewed: I have personally reviewed following labs and imaging studies  CBC: Recent Labs  Lab 12/03/23 1753 12/04/23 0245 12/04/23 1621 12/05/23 0729  WBC 13.9* 20.7* 18.0* 9.5  NEUTROABS 11.5* 18.0* 15.7* 6.8  HGB 14.5 15.5 12.0* 12.3*  HCT 43.4 45.5 35.1* 36.2*  MCV 88.9 88.0 88.0 88.7  PLT 222 214 172 159   Basic Metabolic Panel: Recent Labs  Lab 12/03/23 1753 12/04/23 0245 12/04/23 1621 12/05/23 0729 12/05/23 0738 12/05/23 1535  NA 135 132* 134* 134* 135  --   K 4.3 4.0 4.4 4.0 4.0  --   CL 99 94* 98 97* 96*  --   CO2 23 22 22 24 23   --   GLUCOSE 240* 205* 153* 121* 116*  --   BUN 18 18 16 18 18   --   CREATININE 1.47* 1.36* 1.15 1.03 1.06  --   CALCIUM 9.5 9.9 8.9 9.1 9.1  --   MG  --   --  2.0 1.9  --   --   PHOS  --   --  2.5  --  1.9* 2.2*   GFR: Estimated Creatinine Clearance: 49.3 mL/min (by C-G formula based on SCr of 1.06 mg/dL). Liver Function Tests: Recent Labs  Lab 12/04/23 0245 12/04/23 1621 12/05/23 0729 12/05/23 0738  AST 30  --  20  --   ALT 30  --  21  --   ALKPHOS 81  --  59  --   BILITOT 1.4*  --  0.7  --   PROT 8.4*  --  6.6  --   ALBUMIN 3.9 2.9* 2.9* 2.9*   No results for input(s): "LIPASE", "AMYLASE" in the last 168 hours. No results for input(s): "AMMONIA" in the last 168 hours. Coagulation Profile: Recent Labs  Lab 12/04/23 0245  INR 1.5*   Cardiac Enzymes: No results for input(s): "CKTOTAL", "CKMB", "CKMBINDEX", "TROPONINI" in the last 168 hours. BNP (last 3 results) No results for input(s): "PROBNP" in the last 8760 hours. HbA1C: No results for input(s): "HGBA1C" in the last 72 hours. CBG: Recent Labs  Lab 12/04/23 1639 12/04/23 2045 12/05/23 0820 12/05/23 1159 12/05/23 1500  GLUCAP 168* 150* 123* 127* 101*   Lipid Profile: No results for input(s): "CHOL", "HDL",  "LDLCALC", "TRIG", "CHOLHDL", "LDLDIRECT" in the last 72 hours. Thyroid Function Tests: No results for input(s): "TSH", "T4TOTAL", "FREET4", "T3FREE", "THYROIDAB" in the last 72 hours. Anemia Panel: No results for input(s): "VITAMINB12", "FOLATE", "FERRITIN", "TIBC", "IRON", "RETICCTPCT" in the last 72 hours. Urine analysis:    Component Value Date/Time   COLORURINE YELLOW 12/04/2023 0609   APPEARANCEUR CLEAR 12/04/2023 0609   LABSPEC 1.023 12/04/2023 0609   PHURINE 5.0 12/04/2023 0609   GLUCOSEU >=500 (A) 12/04/2023 0609   GLUCOSEU NEGATIVE 09/02/2023 1019   HGBUR NEGATIVE 12/04/2023 0609   BILIRUBINUR NEGATIVE 12/04/2023 0609   BILIRUBINUR n 10/22/2017 0848   KETONESUR 20 (A) 12/04/2023 0609   PROTEINUR 30 (A) 12/04/2023 0609   UROBILINOGEN 0.2 09/02/2023 1019   NITRITE NEGATIVE 12/04/2023 0609   LEUKOCYTESUR NEGATIVE 12/04/2023 0609   Sepsis Labs: @LABRCNTIP (procalcitonin:4,lacticidven:4)  ) Recent Results (from the past 240 hours)  Resp panel by RT-PCR (RSV, Flu A&B, Covid) Anterior Nasal Swab  Status: None   Collection Time: 12/03/23  6:05 PM   Specimen: Anterior Nasal Swab  Result Value Ref Range Status   SARS Coronavirus 2 by RT PCR NEGATIVE NEGATIVE Final   Influenza A by PCR NEGATIVE NEGATIVE Final   Influenza B by PCR NEGATIVE NEGATIVE Final    Comment: (NOTE) The Xpert Xpress SARS-CoV-2/FLU/RSV plus assay is intended as an aid in the diagnosis of influenza from Nasopharyngeal swab specimens and should not be used as a sole basis for treatment. Nasal washings and aspirates are unacceptable for Xpert Xpress SARS-CoV-2/FLU/RSV testing.  Fact Sheet for Patients: BloggerCourse.com  Fact Sheet for Healthcare Providers: SeriousBroker.it  This test is not yet approved or cleared by the [place] FDA and has been authorized for detection and/or diagnosis of SARS-CoV-2 by FDA under an Emergency Use  Authorization (EUA). This EUA will remain in effect (meaning this test can be used) for the duration of the COVID-19 declaration under Section 564(b)(1) of the Act, 21 U.S.C. section 360bbb-3(b)(1), unless the authorization is terminated or revoked.     Resp Syncytial Virus by PCR NEGATIVE NEGATIVE Final    Comment: (NOTE) Fact Sheet for Patients: BloggerCourse.com  Fact Sheet for Healthcare Providers: SeriousBroker.it  This test is not yet approved or cleared by the [place] FDA and has been authorized for detection and/or diagnosis of SARS-CoV-2 by FDA under an Emergency Use Authorization (EUA). This EUA will remain in effect (meaning this test can be used) for the duration of the COVID-19 declaration under Section 564(b)(1) of the Act, 21 U.S.C. section 360bbb-3(b)(1), unless the authorization is terminated or revoked.  Performed at [facility], [address], [place], [place] [address]   Blood Culture (routine x 2)     Status: None (Preliminary result)   Collection Time: 12/04/23  3:07 AM   Specimen: BLOOD  Result Value Ref Range Status   Specimen Description BLOOD RIGHT ANTECUBITAL  Final   Special Requests   Final    BOTTLES DRAWN AEROBIC AND ANAEROBIC Blood Culture results may not be optimal due to an inadequate volume of blood received in culture bottles   Culture   Final    NO GROWTH 1 DAY Performed at [facility], [address] [place], [place] [address]    Report Status PENDING  Incomplete  Blood Culture (routine x 2)     Status: None (Preliminary result)   Collection Time: 12/04/23  3:19 AM   Specimen: BLOOD LEFT WRIST  Result Value Ref Range Status   Specimen Description BLOOD LEFT WRIST  Final   Special Requests   Final    BOTTLES DRAWN AEROBIC AND ANAEROBIC Blood Culture adequate volume   Culture   Final    NO GROWTH 1 DAY Performed at [facility], [address]  [place], [place] [address]    Report Status PENDING  Incomplete  Respiratory (~20 pathogens) panel by PCR     Status: Abnormal   Collection Time: 12/04/23  6:09 AM   Specimen: Urine, Clean Catch; Respiratory  Result Value Ref Range Status   Adenovirus NOT DETECTED NOT DETECTED Final   Coronavirus 229E NOT DETECTED NOT DETECTED Final    Comment: (NOTE) The Coronavirus on the Respiratory Panel, DOES NOT test for the novel  Coronavirus (2019 nCoV)    Coronavirus HKU1 NOT DETECTED NOT DETECTED Final   Coronavirus NL63 DETECTED (A) NOT DETECTED Final   Coronavirus OC43 NOT DETECTED NOT DETECTED Final   Metapneumovirus  NOT DETECTED NOT DETECTED Final   Rhinovirus / Enterovirus NOT DETECTED NOT DETECTED Final   Influenza A NOT DETECTED NOT DETECTED Final   Influenza B NOT DETECTED NOT DETECTED Final   Parainfluenza Virus 1 NOT DETECTED NOT DETECTED Final   Parainfluenza Virus 2 NOT DETECTED NOT DETECTED Final   Parainfluenza Virus 3 NOT DETECTED NOT DETECTED Final   Parainfluenza Virus 4 NOT DETECTED NOT DETECTED Final   Respiratory Syncytial Virus NOT DETECTED NOT DETECTED Final   Bordetella pertussis NOT DETECTED NOT DETECTED Final   Bordetella Parapertussis NOT DETECTED NOT DETECTED Final   Chlamydophila pneumoniae NOT DETECTED NOT DETECTED Final   Mycoplasma pneumoniae NOT DETECTED NOT DETECTED Final    Comment: Performed at [facility], [address] [place], [place] [address]  Expectorated Sputum Assessment w Gram Stain, Rflx to Resp Cult     Status: None   Collection Time: 12/04/23  6:38 AM   Specimen: Expectorated Sputum  Result Value Ref Range Status   Specimen Description EXPECTORATED SPUTUM  Final   Special Requests NONE  Final   Sputum evaluation   Final    Sputum specimen not acceptable for testing.  Please recollect.   NOTIFIED RN PAIGE PULLIAM ON 12/04/23 @ 1321 BY DRT Performed at [facility], [address], [place], [place] [address]    Report Status  12/04/2023 FINAL  Final         Radiology Studies: CT Chest Wo Contrast Result Date: 12/04/2023 CLINICAL DATA:  Respiratory illness with nondiagnostic chest radiograph. Chest pain. EXAM: CT CHEST WITHOUT CONTRAST TECHNIQUE: Multidetector CT imaging of the chest was performed following the standard protocol without IV contrast. RADIATION DOSE REDUCTION: This exam was performed according to the departmental dose-optimization program which includes automated exposure control, adjustment of the mA and/or kV according to patient size and/or use of iterative reconstruction technique. COMPARISON:  08/10/2022 FINDINGS: Cardiovascular: Normal heart size. Aortic atherosclerosis and coronary artery calcifications. Mediastinum/Nodes: Thyroid gland, trachea and esophagus demonstrate no significant findings. No mediastinal adenopathy. Hilar lymph nodes suboptimally evaluated due to lack of IV contrast. Lungs/Pleura: Central airways are patent. No pleural effusion. No signs of pneumothorax. Ground-glass attenuation and consolidative changes identified within the right lower lobe and posterior medial right middle lobe. Upper Abdomen: No acute abnormality. Heterogeneous appearance of the liver. Focal low-attenuation area within the dome of liver is noted involving segments 8 and 4 a. This area measures approximately 7.5 cm and is suboptimally evaluated due to lack of IV contrast. Musculoskeletal: No chest wall mass or suspicious bone lesions identified. IMPRESSION: 1. Ground-glass attenuation and consolidative changes identified within the right lower lobe and posterior medial right middle lobe. Findings are compatible with multifocal pneumonia. Followup imaging is recommended in 3-4 weeks following trial of antibiotic therapy to ensure resolution and exclude underlying malignancy. 2. Heterogeneous appearance of the liver. Focal low-attenuation area within the dome of liver is noted involving segments 8 and 4 a. This area  measures approximately 7.5 cm and is suboptimally evaluated due to lack of IV contrast. Further evaluation with nonemergent abdominal MRI or CT with and without contrast is recommended. 3. Coronary artery calcifications. 4.  Aortic Atherosclerosis (ICD10-I70.0). Electronically Signed   By: Signa Kell M.D.   On: 12/04/2023 06:08   CT Soft Tissue Neck Wo Contrast Result Date: 12/04/2023 CLINICAL DATA:  89 year old male with chest pain, cough, oxygen requirement, fever, "Neck mass" . EXAM: CT NECK WITHOUT CONTRAST TECHNIQUE: Multidetector CT imaging of the  neck was performed following the standard protocol without intravenous contrast. RADIATION DOSE REDUCTION: This exam was performed according to the departmental dose-optimization program which includes automated exposure control, adjustment of the mA and/or kV according to patient size and/or use of iterative reconstruction technique. COMPARISON:  Cervical spine MRI 11/14/2014. FINDINGS: Pharynx and larynx: Negative noncontrast larynx and pharynx. Partially retropharyngeal course of the right carotid, otherwise noncontrast retropharyngeal and parapharyngeal spaces. Salivary glands: Negative noncontrast appearance, sublingual space. Thyroid: Negative for age. Lymph nodes: No cervical lymphadenopathy. Diminutive bilateral cervical lymph nodes. Vascular: Vascular patency is not evaluated in the absence of IV contrast. Partially retropharyngeal right carotid. Bilateral carotid calcified atherosclerosis. Limited intracranial: Calcified atherosclerosis at the skull base. Negative visible noncontrast brain parenchyma. Visualized orbits: Partially visible postoperative changes to the globes. Mastoids and visualized paranasal sinuses: Mild to moderate ethmoid sinus mucosal thickening and opacification. Right maxillary alveolar recess mucosal thickening. Tympanic cavities and mastoids are clear. Skeleton: No acute or suspicious osseous lesion is identified. Cervical  spine degeneration. Chronic postinflammatory changes at the posterior left mandible, left wisdom tooth area which is extracted. Upper chest: Calcified aortic atherosclerosis. Mild respiratory motion. Questionable upper lung emphysema. Subglottic trachea is patent. Visible superior mediastinal lymph nodes are within normal limits. IMPRESSION: 1. No acute or inflammatory process identified in the noncontrast Neck. No neck mass or lymphadenopathy identified. 2. Mild to moderate paranasal sinus inflammation. 3.  Aortic Atherosclerosis (ICD10-I70.0). Electronically Signed   By: Odessa Fleming M.D.   On: 12/04/2023 06:08   DG Chest Port 1 View Result Date: 12/04/2023 CLINICAL DATA:  Possible sepsis EXAM: PORTABLE CHEST 1 VIEW COMPARISON:  12/03/2023 FINDINGS: Cardiac shadow is stable. Aortic calcifications are noted. The lungs are well aerated bilaterally. No focal infiltrate or effusion is seen. No bony abnormality is noted. IMPRESSION: No acute abnormality noted. Electronically Signed   By: Alcide Clever M.D.   On: 12/04/2023 03:01   DG Chest 2 View Result Date: 12/03/2023 CLINICAL DATA:  Cough for several weeks EXAM: CHEST - 2 VIEW COMPARISON:  X-ray 08/10/2022 FINDINGS: Slight elevation of the right hemidiaphragm. No consolidation, pneumothorax or effusion. No edema. Normal cardiopericardial silhouette calcified aorta. Degenerative changes along the spine. Kyphotic x-ray with rotation obscures the right lung apex. IMPRESSION: No acute cardiopulmonary disease.  Chronic changes. Electronically Signed   By: Karen Kays M.D.   On: 12/03/2023 18:55        Scheduled Meds:  apixaban  5 mg Oral BID   doxycycline  100 mg Oral Q12H   DULoxetine  60 mg Oral Daily   guaiFENesin  600 mg Oral BID   insulin aspart  0-5 Units Subcutaneous QHS   insulin aspart  0-6 Units Subcutaneous TID WC   insulin glargine  10 Units Subcutaneous Daily   pantoprazole  40 mg Oral Daily   phosphorus  500 mg Oral TID   QUEtiapine  12.5  mg Oral BID   sodium chloride flush  3 mL Intravenous Q12H   sodium chloride flush  3 mL Intravenous Q12H   Continuous Infusions:  cefTRIAXone (ROCEPHIN)  IV Stopped (12/05/23 0622)     LOS: 1 day    Time spent: 35 minutes.    Berton Mount, MD  [facility] Pager #: [phone] 7PM-7AM contact night coverage as above

## 2023-12-05 NOTE — Discharge Summary (Incomplete)
Physician Discharge Summary  Patient ID: Dakota Lewis MRN: [id number] DOB/AGE: [birth year] 88 y.o.  Admit date: 12/04/2023 Discharge date: 12/05/2023  Admission Diagnoses:  Discharge Diagnoses:  Principal Problem:   CAP (community acquired pneumonia) Active Problems:   Essential hypertension   AKI (acute kidney injury) (HCC)   Non-insulin dependent type 2 diabetes mellitus (HCC)   History of prostate cancer   Hyperlipidemia   History of compensated hepatic cirrhosis (HCC)   History of pulmonary embolism   Community acquired pneumonia   Chronic diastolic CHF (congestive heart failure) (HCC)   Heterogeneous opacity of the liver-questionable mass   Hypophosphatemia   Discharged Condition: stable  Hospital Course:   Consults: None  Significant Diagnostic Studies:  CT CHEST WITHOUT CONTRAST:   TECHNIQUE: Multidetector CT imaging of the chest was performed following the standard protocol without IV contrast.   RADIATION DOSE REDUCTION: This exam was performed according to the departmental dose-optimization program which includes automated exposure control, adjustment of the mA and/or kV according to patient size and/or use of iterative reconstruction technique.   COMPARISON:  08/10/2022   FINDINGS: Cardiovascular: Normal heart size. Aortic atherosclerosis and coronary artery calcifications.   Mediastinum/Nodes: Thyroid gland, trachea and esophagus demonstrate no significant findings. No mediastinal adenopathy. Hilar lymph nodes suboptimally evaluated due to lack of IV contrast.   Lungs/Pleura: Central airways are patent. No pleural effusion. No signs of pneumothorax. Ground-glass attenuation and consolidative changes identified within the right lower lobe and posterior medial right middle lobe.   Upper Abdomen: No acute abnormality. Heterogeneous appearance of the liver. Focal low-attenuation area within the dome of liver is noted involving segments 8 and 4 a.  This area measures approximately 7.5 cm and is suboptimally evaluated due to lack of IV contrast.   Musculoskeletal: No chest wall mass or suspicious bone lesions identified.   IMPRESSION: 1. Ground-glass attenuation and consolidative changes identified within the right lower lobe and posterior medial right middle lobe. Findings are compatible with multifocal pneumonia. Followup imaging is recommended in 3-4 weeks following trial of antibiotic therapy to ensure resolution and exclude underlying malignancy. 2. Heterogeneous appearance of the liver. Focal low-attenuation area within the dome of liver is noted involving segments 8 and 4 a. This area measures approximately 7.5 cm and is suboptimally evaluated due to lack of IV contrast. Further evaluation with nonemergent abdominal MRI or CT with and without contrast is recommended. 3. Coronary artery calcifications. 4.  Aortic Atherosclerosis (ICD10-I70.0).     Electronically Signed   By: Signa Kell M.D.   On: 12/04/2023 06:08     CT NECK WITHOUT CONTRAST:   TECHNIQUE: Multidetector CT imaging of the neck was performed following the standard protocol without intravenous contrast.   RADIATION DOSE REDUCTION: This exam was performed according to the departmental dose-optimization program which includes automated exposure control, adjustment of the mA and/or kV according to patient size and/or use of iterative reconstruction technique.   COMPARISON:  Cervical spine MRI 11/14/2014.   FINDINGS: Pharynx and larynx: Negative noncontrast larynx and pharynx. Partially retropharyngeal course of the right carotid, otherwise noncontrast retropharyngeal and parapharyngeal spaces.   Salivary glands: Negative noncontrast appearance, sublingual space.   Thyroid: Negative for age.   Lymph nodes: No cervical lymphadenopathy. Diminutive bilateral cervical lymph nodes.   Vascular: Vascular patency is not evaluated in the absence of  IV contrast. Partially retropharyngeal right carotid. Bilateral carotid calcified atherosclerosis.   Limited intracranial: Calcified atherosclerosis at the skull base. Negative visible noncontrast brain parenchyma.  Visualized orbits: Partially visible postoperative changes to the globes.   Mastoids and visualized paranasal sinuses: Mild to moderate ethmoid sinus mucosal thickening and opacification. Right maxillary alveolar recess mucosal thickening. Tympanic cavities and mastoids are clear.   Skeleton: No acute or suspicious osseous lesion is identified. Cervical spine degeneration. Chronic postinflammatory changes at the posterior left mandible, left wisdom tooth area which is extracted.   Upper chest: Calcified aortic atherosclerosis. Mild respiratory motion. Questionable upper lung emphysema. Subglottic trachea is patent. Visible superior mediastinal lymph nodes are within normal limits.   IMPRESSION: 1. No acute or inflammatory process identified in the noncontrast Neck. No neck mass or lymphadenopathy identified. 2. Mild to moderate paranasal sinus inflammation. 3.  Aortic Atherosclerosis (ICD10-I70.0).     Electronically Signed   By: Odessa Fleming M.D.   On: 12/04/2023 06:08      PORTABLE CHEST 1 VIEW   COMPARISON:  12/03/2023   FINDINGS: Cardiac shadow is stable. Aortic calcifications are noted. The lungs are well aerated bilaterally. No focal infiltrate or effusion is seen. No bony abnormality is noted.   IMPRESSION: No acute abnormality noted.     Electronically Signed   By: Alcide Clever M.D.   On: 12/04/2023 03:01   Treatments: {Tx:[id number]}  Discharge Exam: Blood pressure (!) 154/71, pulse 74, temperature 97.9 F (36.6 C), resp. rate 18, height 5\' 7"  (1.702 m), weight 81.6 kg, SpO2 96%. {physical [id number]}  Disposition: Discharge disposition: 01-Home or Self Care       Discharge Instructions     Diet - low sodium heart healthy   Complete  by: As directed    Diet Carb Modified   Complete by: As directed    Increase activity slowly   Complete by: As directed       Allergies as of 12/05/2023       Reactions   Lidocaine    ANTI ITCH CREAM, NO SPECIFICATIONS IN RECORDS.   Lisinopril Cough   Metformin Diarrhea        Medication List     STOP taking these medications    Cholecalciferol 50 MCG (2000 UT) Caps   Jardiance 25 MG Tabs tablet Generic drug: empagliflozin   losartan 100 MG tablet Commonly known as: COZAAR   polyethylene glycol 17 g packet Commonly known as: MIRALAX / GLYCOLAX   potassium chloride 10 MEQ CR capsule Commonly known as: MICRO-K   Theratears PF 0.25 % Soln Generic drug: Carboxymethylcellulose Sod PF   torsemide 5 MG tablet Commonly known as: DEMADEX   vitamin C 1000 MG tablet       TAKE these medications    acetaminophen 325 MG tablet Commonly known as: TYLENOL Take 2 tablets (650 mg total) by mouth every 6 (six) hours as needed for mild pain (pain score 1-3) (or Fever >/= 101). What changed:  medication strength how much to take when to take this reasons to take this   amLODipine 10 MG tablet Commonly known as: NORVASC Take 1 tablet (10 mg total) by mouth daily.   amoxicillin-clavulanate 875-125 MG tablet Commonly known as: AUGMENTIN Take 1 tablet by mouth 2 (two) times daily for 5 days.   apixaban 5 MG Tabs tablet Commonly known as: ELIQUIS Take 5 mg by mouth 2 (two) times daily.   Combivent Respimat 20-100 MCG/ACT Aers respimat Generic drug: Ipratropium-Albuterol Inhale 1 puff into the lungs every 6 (six) hours.   doxycycline 100 MG tablet Commonly known as: VIBRA-TABS Take 1 tablet (100 mg total) by mouth  every 12 (twelve) hours for 5 days.   DULoxetine 60 MG capsule Commonly known as: Cymbalta Take 1 capsule (60 mg total) by mouth daily.   fluticasone 50 MCG/ACT nasal spray Commonly known as: FLONASE Place 1 spray into both nostrils daily as  needed for allergies.   Lantus SoloStar 100 UNIT/ML Solostar Pen Generic drug: insulin glargine Inject 10 Units into the skin daily.   omeprazole 20 MG capsule Commonly known as: PRILOSEC Take 1 capsule (20 mg total) by mouth daily.   phosphorus 155-852-130 MG tablet Commonly known as: K PHOS NEUTRAL Take 2 tablets (500 mg total) by mouth 3 (three) times daily for 4 doses.   valsartan 160 MG tablet Commonly known as: DIOVAN Take 1 tablet (160 mg total) by mouth daily.         SignedBarnetta Chapel 12/05/2023, 5:33 PM

## 2023-12-06 ENCOUNTER — Inpatient Hospital Stay (HOSPITAL_COMMUNITY)

## 2023-12-06 DIAGNOSIS — J189 Pneumonia, unspecified organism: Secondary | ICD-10-CM | POA: Diagnosis not present

## 2023-12-06 LAB — GLUCOSE, CAPILLARY
Glucose-Capillary: 170 mg/dL — ABNORMAL HIGH (ref 70–99)
Glucose-Capillary: 133 mg/dL — ABNORMAL HIGH (ref 70–99)

## 2023-12-06 LAB — PHOSPHORUS: Phosphorus: 2.7 mg/dL (ref 2.5–4.6)

## 2023-12-06 LAB — CBC WITH DIFFERENTIAL/PLATELET
Abs Immature Granulocytes: 0.18 10*3/uL — ABNORMAL HIGH (ref 0.00–0.07)
Basophils Absolute: 0.1 10*3/uL (ref 0.0–0.1)
Basophils Relative: 1 %
Eosinophils Absolute: 0.1 10*3/uL (ref 0.0–0.5)
Eosinophils Relative: 2 %
HCT: 36.6 % — ABNORMAL LOW (ref 39.0–52.0)
Hemoglobin: 12.5 g/dL — ABNORMAL LOW (ref 13.0–17.0)
Immature Granulocytes: 3 %
Lymphocytes Relative: 23 %
Lymphs Abs: 1.5 10*3/uL (ref 0.7–4.0)
MCH: 29.8 pg (ref 26.0–34.0)
MCHC: 34.2 g/dL (ref 30.0–36.0)
MCV: 87.4 fL (ref 80.0–100.0)
Monocytes Absolute: 0.7 10*3/uL (ref 0.1–1.0)
Monocytes Relative: 11 %
Neutro Abs: 3.8 10*3/uL (ref 1.7–7.7)
Neutrophils Relative %: 60 %
Platelets: 199 10*3/uL (ref 150–400)
RBC: 4.19 MIL/uL — ABNORMAL LOW (ref 4.22–5.81)
RDW: 12.4 % (ref 11.5–15.5)
WBC: 6.4 10*3/uL (ref 4.0–10.5)
nRBC: 0 % (ref 0.0–0.2)

## 2023-12-06 LAB — COMPREHENSIVE METABOLIC PANEL
ALT: 22 U/L (ref 0–44)
AST: 26 U/L (ref 15–41)
Albumin: 2.9 g/dL — ABNORMAL LOW (ref 3.5–5.0)
Alkaline Phosphatase: 67 U/L (ref 38–126)
Anion gap: 10 (ref 5–15)
BUN: 21 mg/dL (ref 8–23)
CO2: 24 mmol/L (ref 22–32)
Calcium: 8.9 mg/dL (ref 8.9–10.3)
Chloride: 99 mmol/L (ref 98–111)
Creatinine, Ser: 0.97 mg/dL (ref 0.61–1.24)
GFR, Estimated: >60 mL/min (ref >60)
Glucose, Bld: 147 mg/dL — ABNORMAL HIGH (ref 70–99)
Potassium: 3.8 mmol/L (ref 3.5–5.1)
Sodium: 133 mmol/L — ABNORMAL LOW (ref 135–145)
Total Bilirubin: 0.5 mg/dL (ref 0.0–1.2)
Total Protein: 6.6 g/dL (ref 6.5–8.1)

## 2023-12-06 LAB — LEGIONELLA PNEUMOPHILA SEROGP 1 UR AG: L. pneumophila Serogp 1 Ur Ag: NEGATIVE

## 2023-12-06 MED ORDER — IOHEXOL 350 MG/ML SOLN
75.0000 mL | Freq: Once | INTRAVENOUS | Status: AC | PRN
  Administered 2023-12-06: 75 mL via INTRAVENOUS

## 2023-12-06 MED ORDER — AMOXICILLIN-POT CLAVULANATE 875-125 MG PO TABS: 1.0000 | ORAL_TABLET | Freq: Two times a day (BID) | ORAL | 0 refills | Status: AC

## 2023-12-06 MED ORDER — COMBIVENT RESPIMAT 20-100 MCG/ACT IN AERS: 1.0000 | INHALATION_SPRAY | Freq: Four times a day (QID) | RESPIRATORY_TRACT | 0 refills | Status: DC

## 2023-12-06 MED ORDER — K PHOS MONO-SOD PHOS DI & MONO 155-852-130 MG PO TABS: 500.0000 mg | ORAL_TABLET | Freq: Three times a day (TID) | ORAL | 0 refills | Status: AC

## 2023-12-06 MED ORDER — DOXYCYCLINE HYCLATE 100 MG PO TABS: 100.0000 mg | ORAL_TABLET | Freq: Two times a day (BID) | ORAL | 0 refills | Status: AC

## 2023-12-06 NOTE — Discharge Summary (Addendum)
 Physician Discharge Summary  Patient ID: SELBY FOISY MRN: [id number] DOB/AGE: [birth year] 89 y.o.  Admit date: 12/04/2023 Discharge date: 12/06/2023  Admission Diagnoses:  Discharge Diagnoses:  Principal Problem:   CAP (community acquired pneumonia) Active Problems:   Essential hypertension   AKI (acute kidney injury) (HCC)   Non-insulin dependent type 2 diabetes mellitus (HCC)   History of prostate cancer   Hyperlipidemia   History of compensated hepatic cirrhosis (HCC)   History of pulmonary embolism   Community acquired pneumonia   Chronic diastolic CHF (congestive heart failure) (HCC)   Heterogeneous opacity of the liver-questionable mass   Liver cirrhosis   Hepatic steatosis   Hypophosphatemia   Discharged Condition: stable  Hospital Course:  Patient is an 89 year old male, past medical history significant for type 2 diabetes mellitus, hypertension, osteoarthritis, hyperlipidemia, liver cirrhosis without ascites, history of prostate cancer, gout, pulmonary embolism and DVT on Eliquis.  Patient was admitted with multifocal pneumonia/community-acquired pneumonia, elevated troponin (type II elevation), questionable liver abnormality (CT abdomen and pelvis revealed liver cirrhosis and hepatic steatosis) and acute kidney injury.  Patient was managed with antibiotics.  Acute kidney injury has resolved.  Patient is off supplemental oxygen.  Patient will be discharged back onto the care of the primary care provider.   Community-acquired pneumonia/multifocal pneumonia: -Improved significantly. -Change antibiotics to Augmentin and doxycycline on discharge. -Patient is off supplemental oxygen. -No constitutional symptoms noted today.    Liver cirrhosis/hepatic steatosis:  - CT abdomen and pelvis revealed hepatic steatosis.  Patient is known to have liver cirrhosis. -Liver cirrhosis is compensated.  No ascites. -Diet and exercise.   Acute kidney injury: -Resolved.    Hypertension: -Restart amlodipine and valsartan.   Type 2 diabetes mellitus: -Continue to hold Jardiance. -Continue to monitor and optimize.   Hyperlipidemia:    History of pulmonary embolism/DVT: -Continue Eliquis.   History of prostate cancer: -In remission.   Hypophosphatemia: -Neutra-Phos. -Continue to monitor phosphorus level.    Consults: None  Significant Diagnostic Studies:  -Coronavirus NL 63 detected   CT ABDOMEN AND PELVIS WITH CONTRAST:   TECHNIQUE: Multidetector CT imaging of the abdomen and pelvis was performed using the standard protocol following bolus administration of intravenous contrast.   RADIATION DOSE REDUCTION: This exam was performed according to the departmental dose-optimization program which includes automated exposure control, adjustment of the mA and/or kV according to patient size and/or use of iterative reconstruction technique.   CONTRAST:  75mL OMNIPAQUE IOHEXOL 350 MG/ML SOLN   COMPARISON:  CT abdomen and pelvis dated 08/10/2022.   FINDINGS: Lower chest: There is moderate right basilar atelectasis and consolidation.   Hepatobiliary: Hypoattenuation of the liver, primarily the right hepatic lobe, appears similar to prior exam and likely reflects hepatic steatosis superimposed on cirrhosis. No gallstones, gallbladder wall thickening, or biliary dilatation.   Pancreas: Unremarkable. No pancreatic ductal dilatation or surrounding inflammatory changes.   Spleen: Normal in size without focal abnormality.   Adrenals/Urinary Tract: Adrenal glands are unremarkable. Bilateral renal cysts measure up to 3.1 cm. No imaging follow-up is recommended. No renal calculi or hydronephrosis. Bladder is unremarkable.   Stomach/Bowel: Stomach is within normal limits. Appendix appears normal. There is colonic diverticulosis without evidence of diverticulitis. No evidence of bowel wall thickening, distention, or inflammatory changes.    Vascular/Lymphatic: Aortic atherosclerosis. An inferior vena cava filter is redemonstrated. No enlarged abdominal or pelvic lymph nodes.   Reproductive: No suspicious finding.   Other: Multiple fat containing paraumbilical hernias are noted. No free intraperitoneal fluid  Musculoskeletal: Degenerative changes are seen in the spine.   IMPRESSION: 1. Multiple fat containing paraumbilical hernias. 2. Moderate right basilar atelectasis and consolidation may reflect aspiration/pneumonia. 3. Findings likely reflecting hepatic steatosis superimposed on cirrhosis.   Aortic Atherosclerosis (ICD10-I70.0).     Electronically Signed   By: Romona Curls M.D.   On: 12/06/2023 15:33    CT CHEST WITHOUT CONTRAST:   TECHNIQUE: Multidetector CT imaging of the chest was performed following the standard protocol without IV contrast.   RADIATION DOSE REDUCTION: This exam was performed according to the departmental dose-optimization program which includes automated exposure control, adjustment of the mA and/or kV according to patient size and/or use of iterative reconstruction technique.   COMPARISON:  08/10/2022   FINDINGS: Cardiovascular: Normal heart size. Aortic atherosclerosis and coronary artery calcifications.   Mediastinum/Nodes: Thyroid gland, trachea and esophagus demonstrate no significant findings. No mediastinal adenopathy. Hilar lymph nodes suboptimally evaluated due to lack of IV contrast.   Lungs/Pleura: Central airways are patent. No pleural effusion. No signs of pneumothorax. Ground-glass attenuation and consolidative changes identified within the right lower lobe and posterior medial right middle lobe.   Upper Abdomen: No acute abnormality. Heterogeneous appearance of the liver. Focal low-attenuation area within the dome of liver is noted involving segments 8 and 4 a. This area measures approximately 7.5 cm and is suboptimally evaluated due to lack of IV  contrast.   Musculoskeletal: No chest wall mass or suspicious bone lesions identified.   IMPRESSION: 1. Ground-glass attenuation and consolidative changes identified within the right lower lobe and posterior medial right middle lobe. Findings are compatible with multifocal pneumonia. Followup imaging is recommended in 3-4 weeks following trial of antibiotic therapy to ensure resolution and exclude underlying malignancy. 2. Heterogeneous appearance of the liver. Focal low-attenuation area within the dome of liver is noted involving segments 8 and 4 a. This area measures approximately 7.5 cm and is suboptimally evaluated due to lack of IV contrast. Further evaluation with nonemergent abdominal MRI or CT with and without contrast is recommended. 3. Coronary artery calcifications. 4.  Aortic Atherosclerosis (ICD10-I70.0).     Electronically Signed   By: Signa Kell M.D.   On: 12/04/2023 06:08    CT NECK WITHOUT CONTRAST:   TECHNIQUE: Multidetector CT imaging of the neck was performed following the standard protocol without intravenous contrast.   RADIATION DOSE REDUCTION: This exam was performed according to the departmental dose-optimization program which includes automated exposure control, adjustment of the mA and/or kV according to patient size and/or use of iterative reconstruction technique.   COMPARISON:  Cervical spine MRI 11/14/2014.   FINDINGS: Pharynx and larynx: Negative noncontrast larynx and pharynx. Partially retropharyngeal course of the right carotid, otherwise noncontrast retropharyngeal and parapharyngeal spaces.   Salivary glands: Negative noncontrast appearance, sublingual space.   Thyroid: Negative for age.   Lymph nodes: No cervical lymphadenopathy. Diminutive bilateral cervical lymph nodes.   Vascular: Vascular patency is not evaluated in the absence of IV contrast. Partially retropharyngeal right carotid. Bilateral carotid calcified  atherosclerosis.   Limited intracranial: Calcified atherosclerosis at the skull base. Negative visible noncontrast brain parenchyma.   Visualized orbits: Partially visible postoperative changes to the globes.   Mastoids and visualized paranasal sinuses: Mild to moderate ethmoid sinus mucosal thickening and opacification. Right maxillary alveolar recess mucosal thickening. Tympanic cavities and mastoids are clear.   Skeleton: No acute or suspicious osseous lesion is identified. Cervical spine degeneration. Chronic postinflammatory changes at the posterior left mandible, left wisdom tooth area  which is extracted.   Upper chest: Calcified aortic atherosclerosis. Mild respiratory motion. Questionable upper lung emphysema. Subglottic trachea is patent. Visible superior mediastinal lymph nodes are within normal limits.   IMPRESSION: 1. No acute or inflammatory process identified in the noncontrast Neck. No neck mass or lymphadenopathy identified. 2. Mild to moderate paranasal sinus inflammation. 3.  Aortic Atherosclerosis (ICD10-I70.0).     Electronically Signed   By: Odessa Fleming M.D.   On: 12/04/2023 06:08    Exam: Blood pressure (!) 158/79, pulse 89, temperature 97.6 F (36.4 C), temperature source Oral, resp. rate 16, height 5\' 7"  (1.702 m), weight 81.6 kg, SpO2 97%.   Disposition: Discharge disposition: 01-Home or Self Care       Discharge Instructions     Diet - low sodium heart healthy   Complete by: As directed    Diet Carb Modified   Complete by: As directed    Increase activity slowly   Complete by: As directed    Increase activity slowly   Complete by: As directed       Allergies as of 12/06/2023       Reactions   Lidocaine    ANTI ITCH CREAM, NO SPECIFICATIONS IN RECORDS.   Lisinopril Cough   Metformin Diarrhea        Medication List     STOP taking these medications    Cholecalciferol 50 MCG (2000 UT) Caps   Lantus SoloStar 100 UNIT/ML  Solostar Pen Generic drug: insulin glargine   losartan 100 MG tablet Commonly known as: COZAAR   polyethylene glycol 17 g packet Commonly known as: MIRALAX / GLYCOLAX   potassium chloride 10 MEQ CR capsule Commonly known as: MICRO-K   Theratears PF 0.25 % Soln Generic drug: Carboxymethylcellulose Sod PF   vitamin C 1000 MG tablet       TAKE these medications    acetaminophen 325 MG tablet Commonly known as: TYLENOL Take 2 tablets (650 mg total) by mouth every 6 (six) hours as needed for mild pain (pain score 1-3) (or Fever >/= 101). What changed:  medication strength how much to take when to take this reasons to take this   amLODipine 10 MG tablet Commonly known as: NORVASC Take 1 tablet (10 mg total) by mouth daily.   amoxicillin-clavulanate 875-125 MG tablet Commonly known as: AUGMENTIN Take 1 tablet by mouth 2 (two) times daily for 5 days.   apixaban 5 MG Tabs tablet Commonly known as: ELIQUIS Take 5 mg by mouth 2 (two) times daily.   Combivent Respimat 20-100 MCG/ACT Aers respimat Generic drug: Ipratropium-Albuterol Inhale 1 puff into the lungs every 6 (six) hours.   doxycycline 100 MG tablet Commonly known as: VIBRA-TABS Take 1 tablet (100 mg total) by mouth every 12 (twelve) hours for 5 days.   DULoxetine 60 MG capsule Commonly known as: Cymbalta Take 1 capsule (60 mg total) by mouth daily.   fluticasone 50 MCG/ACT nasal spray Commonly known as: FLONASE Place 1 spray into both nostrils daily as needed for allergies.   Jardiance 25 MG Tabs tablet Generic drug: empagliflozin Take by mouth daily. Taking 1/2 tab daily   omeprazole 20 MG capsule Commonly known as: PRILOSEC Take 1 capsule (20 mg total) by mouth daily.   phosphorus 155-852-130 MG tablet Commonly known as: K PHOS NEUTRAL Take 2 tablets (500 mg total) by mouth 3 (three) times daily for 4 doses.   torsemide 5 MG tablet Commonly known as: DEMADEX Take 1 tablet (5 mg total)  by mouth  daily.   valsartan 160 MG tablet Commonly known as: DIOVAN Take 1 tablet (160 mg total) by mouth daily.        Time spent: 35 minutes.  SignedBarnetta Chapel 12/06/2023, 4:55 PM

## 2023-12-06 NOTE — Plan of Care (Signed)

## 2023-12-06 NOTE — Evaluation (Signed)
 Occupational Therapy Evaluation and DC Summary  Patient Details Name: Dakota Lewis MRN: [id number] DOB: 30-Sep-1933 Today's Date: 12/06/2023   History of Present Illness   89 yo male admitted 2/26 with chest pain, CAP, RBBB. PMHx: DM, DVT, prostate CA, HTN, gout, cirrhosis     Clinical Impressions Pt admitted for above, PTA pt reports being Mod I for ADLs and mobility. Pt currently ambulatory with supervision + RW and demonstrated ability to complete Adls at Mod I level. Educated pt on energy conservation strategies as he continues to get SOB with ambulation on RA, with VSS. Pt has no further acute skilled OT needs, no post acute OT needed.      If plan is discharge home, recommend the following:   Assistance with cooking/housework     Functional Status Assessment   Patient has not had a recent decline in their functional status     Equipment Recommendations   None recommended by OT     Recommendations for Other Services         Precautions/Restrictions   Precautions Precautions: Fall Recall of Precautions/Restrictions: Intact Restrictions Weight Bearing Restrictions Per Provider Order: No     Mobility Bed Mobility     Rolling: Modified independent (Device/Increase time)              Transfers Overall transfer level: Modified independent                        Balance   Sitting-balance support: No upper extremity supported, Feet supported Sitting balance-Leahy Scale: Good Sitting balance - Comments: EOb without assist, able to don socks   Standing balance support: No upper extremity supported, During functional activity Standing balance-Leahy Scale: Good Standing balance comment: pt able to walk in room without support, prefers RW for hall distance                           ADL either performed or assessed with clinical judgement   ADL Overall ADL's : Modified independent                                        General ADL Comments: Pt demonstrated ability to complete standig/seated ADLs with Mod I, discussed bringing BLEs up to him to reduce SOB with bending at waist. Educated pt on the 5ps of energy conservation-handout provided     Vision         Perception         Praxis         Pertinent Vitals/Pain Pain Assessment Pain Assessment: No/denies pain     Extremity/Trunk Assessment Upper Extremity Assessment Upper Extremity Assessment: Overall WFL for tasks assessed   Lower Extremity Assessment Lower Extremity Assessment: Overall WFL for tasks assessed       Communication Communication Communication: Impaired Factors Affecting Communication: Hearing impaired   Cognition Arousal: Alert Behavior During Therapy: WFL for tasks assessed/performed Cognition: No apparent impairments                               Following commands: Intact       Cueing  General Comments   Cueing Techniques: Verbal cues      Exercises     Shoulder Instructions      Home Living Family/patient expects  to be discharged to:: Private residence Living Arrangements: Children                           Home Equipment: Agricultural consultant (2 wheels);Cane - single point          Prior Functioning/Environment Prior Level of Function : Independent/Modified Independent                    OT Problem List: Cardiopulmonary status limiting activity;Decreased activity tolerance   OT Treatment/Interventions:        OT Goals(Current goals can be found in the care plan section)   Acute Rehab OT Goals Patient Stated Goal: To go home OT Goal Formulation: With patient Time For Goal Achievement: 12/20/23 Potential to Achieve Goals: Good   OT Frequency:       Co-evaluation              AM-PAC OT "6 Clicks" Daily Activity     Outcome Measure Help from another person eating meals?: None Help from another person taking care of personal grooming?:  None Help from another person toileting, which includes using toliet, bedpan, or urinal?: None Help from another person bathing (including washing, rinsing, drying)?: None Help from another person to put on and taking off regular upper body clothing?: None Help from another person to put on and taking off regular lower body clothing?: None 6 Click Score: 24   End of Session Equipment Utilized During Treatment: Gait belt Nurse Communication: Mobility status  Activity Tolerance: Patient tolerated treatment well Patient left: in bed;with call bell/phone within reach  OT Visit Diagnosis: Other (comment) (SOB)                Time: 0950-1010 OT Time Calculation (min): 20 min Charges:  OT General Charges $OT Visit: 1 Visit OT Evaluation $OT Eval Low Complexity: 1 Low  12/06/2023  AB, OTR/L  Acute Rehabilitation Services  Office: [phone]   Tristan Schroeder 12/06/2023, 1:12 PM

## 2023-12-06 NOTE — Evaluation (Signed)
 Physical Therapy Brief Evaluation and Discharge Note Patient Details Name: Dakota Lewis MRN: [id number] DOB: 1933/01/14 Today's Date: 12/06/2023   History of Present Illness  89 yo male admitted 2/26 with chest pain, CAP, RBBB. PMHx: DM, DVT, prostate CA, HTN, gout, cirrhosis  Clinical Impression  Pt pleasant, HOH, lives with daughter and cares for himself. He uses walking stick at times and has access to RW. Pt reports a few falls in the last year and performing mobility well with RW this session. Pt at baseline functional level with necessary DME at home. No further acute PT needs, will sign off and defer to mobility specialists.         PT Assessment Patient does not need any further PT services  Assistance Needed at Discharge  PRN    Equipment Recommendations None recommended by PT  Recommendations for Other Services       Precautions/Restrictions Precautions Precautions: Fall Recall of Precautions/Restrictions: Intact        Mobility  Bed Mobility Rolling: Modified independent (Device/Increase time)        Transfers Overall transfer level: Modified independent                      Ambulation/Gait Ambulation/Gait assistance: Supervision Gait Distance (Feet): 300 Feet Assistive device: Rolling walker (2 wheels) Gait Pattern/deviations: Step-through pattern, Decreased stride length Gait Speed: Pace WFL General Gait Details: cues to step into RW, assist for RW  Home Activity Instructions    Stairs Stairs: Yes Stairs assistance: Modified independent (Device/Increase time) Stair Management: One rail Right, Alternating pattern, Forwards Number of Stairs: 3    Modified Rankin (Stroke Patients Only)        Balance Overall balance assessment: History of Falls, Mild deficits observed, not formally tested Sitting-balance support: No upper extremity supported, Feet supported Sitting balance-Leahy Scale: Good Sitting balance - Comments: EOb without  assist, able to don socks   Standing balance support: No upper extremity supported, During functional activity Standing balance-Leahy Scale: Good Standing balance comment: pt able to walk in room without support, prefers RW for hall distance          Pertinent Vitals/Pain PT - Brief Vital Signs All Vital Signs Stable: Yes Pain Assessment Pain Assessment: No/denies pain     Home Living Family/patient expects to be discharged to:: Private residence Living Arrangements: Children Available Help at Discharge: Family;Available PRN/intermittently Home Environment: Stairs to enter  Stairs-Number of Steps: 2 Home Equipment: Agricultural consultant (2 wheels);Cane - single point        Prior Function Level of Independence: Independent Comments: uses cane at times, reports 3 falls    UE/LE Assessment   UE ROM/Strength/Tone/Coordination: Generalized weakness    LE ROM/Strength/Tone/Coordination: Generalized weakness      Communication   Communication Communication: Impaired Factors Affecting Communication: Hearing impaired     Cognition Overall Cognitive Status: Appears within functional limits for tasks assessed/performed       General Comments      Exercises     Assessment/Plan    PT Problem List         PT Visit Diagnosis History of falling (Z91.81)    No Skilled PT Patient is modified independent with all activity/mobility;Patient at baseline level of functioning   Co-evaluation                AMPAC 6 Clicks Help needed turning from your back to your side while in a flat bed without using bedrails?: None Help needed  moving from lying on your back to sitting on the side of a flat bed without using bedrails?: None Help needed moving to and from a bed to a chair (including a wheelchair)?: None Help needed standing up from a chair using your arms (e.g., wheelchair or bedside chair)?: None Help needed to walk in hospital room?: A Little Help needed climbing  3-5 steps with a railing? : A Little 6 Click Score: 22      End of Session   Activity Tolerance: Patient tolerated treatment well Patient left: in chair;with call bell/phone within reach;with chair alarm set Nurse Communication: Mobility status PT Visit Diagnosis: History of falling (Z91.81)     Time: 9604-5409 PT Time Calculation (min) (ACUTE ONLY): 21 min  Charges:   PT Evaluation $PT Eval Low Complexity: 1 Low      Makynleigh Breslin P, PT Acute Rehabilitation Services Office: [phone]   Enedina Finner Mayzee Reichenbach  12/06/2023, 7:59 AM

## 2023-12-08 ENCOUNTER — Telehealth: Payer: Self-pay

## 2023-12-09 ENCOUNTER — Ambulatory Visit: Payer: PPO | Admitting: Adult Health

## 2023-12-09 ENCOUNTER — Encounter: Payer: Self-pay | Admitting: Adult Health

## 2023-12-09 VITALS — BP 140/70 | HR 41 | Temp 98.1°F | Ht 67.0 in | Wt 183.0 lb

## 2023-12-09 DIAGNOSIS — Z86711 Personal history of pulmonary embolism: Secondary | ICD-10-CM | POA: Diagnosis not present

## 2023-12-09 DIAGNOSIS — I10 Essential (primary) hypertension: Secondary | ICD-10-CM

## 2023-12-09 DIAGNOSIS — E1165 Type 2 diabetes mellitus with hyperglycemia: Secondary | ICD-10-CM

## 2023-12-09 DIAGNOSIS — J189 Pneumonia, unspecified organism: Secondary | ICD-10-CM

## 2023-12-09 DIAGNOSIS — K7460 Unspecified cirrhosis of liver: Secondary | ICD-10-CM | POA: Diagnosis not present

## 2023-12-09 DIAGNOSIS — N179 Acute kidney failure, unspecified: Secondary | ICD-10-CM | POA: Diagnosis not present

## 2023-12-09 DIAGNOSIS — Z794 Long term (current) use of insulin: Secondary | ICD-10-CM | POA: Diagnosis not present

## 2023-12-09 DIAGNOSIS — Z8546 Personal history of malignant neoplasm of prostate: Secondary | ICD-10-CM | POA: Diagnosis not present

## 2023-12-09 DIAGNOSIS — R7989 Other specified abnormal findings of blood chemistry: Secondary | ICD-10-CM

## 2023-12-09 DIAGNOSIS — R410 Disorientation, unspecified: Secondary | ICD-10-CM

## 2023-12-09 DIAGNOSIS — E785 Hyperlipidemia, unspecified: Secondary | ICD-10-CM | POA: Diagnosis not present

## 2023-12-09 LAB — POCT GLYCOSYLATED HEMOGLOBIN (HGB A1C): Hemoglobin A1C: 7.1 % — AB (ref 4.0–5.6)

## 2023-12-09 LAB — CULTURE, BLOOD (ROUTINE X 2)
Culture: NO GROWTH
Culture: NO GROWTH
Special Requests: ADEQUATE

## 2023-12-09 MED ORDER — ACCU-CHEK SOFTCLIX LANCETS MISC: 12 refills | Status: AC

## 2023-12-09 NOTE — Patient Instructions (Signed)
 Health Maintenance Due  Topic Date Due   OPHTHALMOLOGY EXAM  08/16/2020   COVID-19 Vaccine (3 - Moderna risk series) 10/06/2020   Medicare Annual Wellness (AWV)  12/20/2023       12/20/2022   10:16 AM 08/28/2022    9:21 AM 02/27/2022    8:18 AM  Depression screen PHQ 2/9  Decreased Interest 0 0 0  Down, Depressed, Hopeless 0 0 0  PHQ - 2 Score 0 0 0  Altered sleeping   1  Tired, decreased energy   1  Change in appetite   2  Feeling bad or failure about yourself    0  Trouble concentrating   0  Moving slowly or fidgety/restless   0  Suicidal thoughts   0  PHQ-9 Score   4  Difficult doing work/chores   Not difficult at all

## 2023-12-09 NOTE — Progress Notes (Signed)
 Subjective:    Patient ID: Dakota Lewis, male    DOB: [birth year], 89 y.o.   MRN: [id number]  HPI 89 year old male who  has a past medical history of Bilateral pulmonary embolism (HCC), Chronic cough, DM (diabetes mellitus) (HCC), DVT (deep venous thrombosis) (HCC), Gastrointestinal bleed, H/O cardiovascular stress test (05/07/2011), H/O Doppler ultrasound (2013), H/O Doppler ultrasound (2012), H/O echocardiogram (03/14/2011), H/O exercise stress test (2006), Hiatal hernia, Hypertension, and Prostate cancer (HCC).  He presents to the office today for TCM visit   Admit Date 12/04/2023 Discharge Date 12/06/2023  He presented to the emergency room complaining of chronic chest pain which had been worsening and a productive cough.  He was treated for pneumonia in January and completed his antibiotic course.  Soon after completion of the antibiotic patient started coughing.  He was also complaining about chest pain which occurred mostly during the cough.  He did have associated shortness of breath.  EMS noted him to be febrile.  Hospital Course  CAP/Multifocal pneumonia  -In the ED he was tachycardic, O2 sats in the low 90s at room air.  He was placed on 3 L oxygen was able to maintain 92 to 93%..  CBC showed elevated WBC count of 20.7.  Normal lactic acid level 1.8. -Last x-ray showed no evidence of pneumonia -CT scan showed groundglass attenuation right lower lobe and posterior medial lobe finding consistent with multifocal pneumonia.  Recommend imaging 3 to 4 weeks following trial of antibiotic therapy to exclude underlying malignancy. -Started on IV ceftriaxone 2 g daily and doxycycline 100 mg daily.  He was discharged home on Augmentin and doxycycline  Elevated troponin secondary to demand ischemia -He was complaining about chest pain which has been ongoing for 1 month.  Reported mid substernal chest pain that got worse with coughing.  His EKG showed normal sinus rhythm and right bundle branch  block.  Troponin trended up to 18.  His elevated troponin was secondary to demand ischemia in the context of pneumonia and new oxygen requirement.  Liver cirrhosis/hepatic steatosis -CT chest showing heterogeneous appearance of the liver.  Focal low-attenuation area within the dome of the liver is noted involving 2 segments.  This area measures approximately 7.5 cm.  CT abdomen pelvis with contrast showed findings likely reflecting hepatic steatosis superimposed on cirrhosis.  He does have known liver cirrhosis with no ascites.   Acute kidney injury -Elevated creatinine 4.7 which improved to 1.36.  Prerenal acute kidney injury in the setting of a new requirement of oxygen from pneumonia and sinus tachycardia.  He was given IV fluids in the emergency room.  Prior to admission his AKI had resolved  Hypertension -Diovan was held in the setting of AKI.  Amlodipine was held given patient had pneumonia and there was concern/risk for development of sepsis developing hypotension. -He was started back on his home medications  Type 2 diabetes mellitus -Jardiance was held in the setting of AKI.  He was continued on Lantus 10 units daily and low-dose sliding scale insulin at bedtime for insulin coverage.  Hyperlipidemia -He is not on any lipid-lowering agent.  Patient has refused in the past  History of PE/DVT - Continued on Eliquis   History of Prostate Cancer - In remission   Hypophosphatemia  - Placed on Neutra-Phos - Continue to monitor phos levels   Today he presents to the office today with his daughter.  He reports today that he is feeling "much better" since being discharged from  the hospital and starting his oral antibiotics.  His daughter reports that while in the hospital he had some delirium which continued at home for a couple of days until he could get his oral antibiotics started.  He has not had any fevers or chills since starting his antibiotics.  He has continued with Jardiance,  never ended up holding this once he was discharged from the hospital   Review of Systems  Constitutional: Negative.   HENT: Negative.    Eyes: Negative.   Respiratory: Negative.    Cardiovascular: Negative.   Gastrointestinal: Negative.   Endocrine: Negative.   Genitourinary: Negative.   Musculoskeletal: Negative.   Skin: Negative.   Allergic/Immunologic: Negative.   Neurological: Negative.   Hematological: Negative.   Psychiatric/Behavioral: Negative.    All other systems reviewed and are negative.  Past Medical History:  Diagnosis Date   Bilateral pulmonary embolism (HCC)    lovenox & coumadin thearpy   Chronic cough    DM (diabetes mellitus) (HCC)    DVT (deep venous thrombosis) (HCC)    left lower   Gastrointestinal bleed    prior   H/O cardiovascular stress test 05/07/2011   normal study, low risk scan   H/O Doppler ultrasound 2013   venous duplex doppler   H/O Doppler ultrasound 2012   venous duplex doppler   H/O echocardiogram 03/14/2011   EF 65-70%   H/O exercise stress test 2006   neg bruce protocol excercise stress test   Hiatal hernia    Hypertension    Prostate cancer [facility])    s/p radiation    Social History   Socioeconomic History   Marital status: Married    Spouse name: Not on file   Number of children: 3   Years of education: Not on file   Highest education level: Not on file  Occupational History   Not on file  Tobacco Use   Smoking status: Former    Current packs/day: 0.75    Average packs/day: 0.8 packs/day for 20.0 years (15.0 ttl pk-yrs)    Types: Cigarettes    Passive exposure: Past   Smokeless tobacco: Never  Vaping Use   Vaping status: Never Used  Substance and Sexual Activity   Alcohol use: No   Drug use: No   Sexual activity: Not on file  Other Topics Concern   Not on file  Social History Narrative   Not on file   Social Drivers of Health   Financial Resource Strain: Low Risk  (12/20/2022)   Overall Financial Resource  Strain (CARDIA)    Difficulty of Paying Living Expenses: Not hard at all  Food Insecurity: No Food Insecurity (12/08/2023)   Hunger Vital Sign    Worried About Running Out of Food in the Last Year: Never true    Ran Out of Food in the Last Year: Never true  Transportation Needs: No Transportation Needs (12/08/2023)   PRAPARE - Administrator, Civil Service (Medical): No    Lack of Transportation (Non-Medical): No  Physical Activity: Insufficiently Active (12/20/2022)   Exercise Vital Sign    Days of Exercise per Week: 5 days    Minutes of Exercise per Session: 20 min  Stress: No Stress Concern Present (12/20/2022)   [employer] of Occupational Health - Occupational Stress Questionnaire    Feeling of Stress : Not at all  Social Connections: Moderately Integrated (12/04/2023)   Social Connection and Isolation Panel [NHANES]    Frequency of Communication with Friends  and Family: More than three times a week    Frequency of Social Gatherings with Friends and Family: More than three times a week    Attends Religious Services: More than 4 times per year    Active Member of Clubs or Organizations: Yes    Attends Banker Meetings: More than 4 times per year    Marital Status: Widowed  Intimate Partner Violence: Patient Unable To Answer (12/08/2023)   Humiliation, Afraid, Rape, and Kick questionnaire    Fear of Current or Ex-Partner: Patient unable to answer    Emotionally Abused: Patient unable to answer    Physically Abused: Patient unable to answer    Sexually Abused: Patient unable to answer    Past Surgical History:  Procedure Laterality Date   AMPUTATION Left 03/21/2022   Procedure: LEFT RING FINGER AND LEFT SMALL FINGER REVISION AMPUTATION;  Surgeon: Betha Loa, MD;  Location: [facility] OR;  Service: Orthopedics;  Laterality: Left;   event monitor     2012   HERNIA REPAIR Bilateral    1991, 1992   HIATAL HERNIA REPAIR     1997   IVC FILTER INSERTION      PROSTATE SURGERY      Family History  Problem Relation Age of Onset   Stroke Mother    Kidney disease Father    Heart disease Brother    Arthritis Brother    Heart disease Brother    Prostate cancer Brother     Allergies  Allergen Reactions   Lidocaine     ANTI ITCH CREAM, NO SPECIFICATIONS IN RECORDS.   Lisinopril Cough   Metformin Diarrhea    Current Outpatient Medications on File Prior to Visit  Medication Sig Dispense Refill   acetaminophen (TYLENOL) 325 MG tablet Take 2 tablets (650 mg total) by mouth every 6 (six) hours as needed for mild pain (pain score 1-3) (or Fever >/= 101).     amLODipine (NORVASC) 10 MG tablet Take 1 tablet (10 mg total) by mouth daily. 90 tablet 0   amoxicillin-clavulanate (AUGMENTIN) 875-125 MG tablet Take 1 tablet by mouth 2 (two) times daily for 5 days. 10 tablet 0   apixaban (ELIQUIS) 5 MG TABS tablet Take 5 mg by mouth 2 (two) times daily.     cholecalciferol (VITAMIN D3) 25 MCG (1000 UNIT) tablet Take 2,000 Units by mouth daily.     doxycycline (VIBRA-TABS) 100 MG tablet Take 1 tablet (100 mg total) by mouth every 12 (twelve) hours for 5 days. 10 tablet 0   DULoxetine (CYMBALTA) 60 MG capsule Take 1 capsule (60 mg total) by mouth daily. 90 capsule 1   empagliflozin (JARDIANCE) 25 MG TABS tablet Take by mouth daily. Taking 1/2 tab daily     fluticasone (FLONASE) 50 MCG/ACT nasal spray Place 1 spray into both nostrils daily as needed for allergies.     Ipratropium-Albuterol (COMBIVENT RESPIMAT) 20-100 MCG/ACT AERS respimat Inhale 1 puff into the lungs every 6 (six) hours. 4 g 0   Multiple Vitamins-Minerals (ICAPS AREDS 2 PO) Take by mouth.     omeprazole (PRILOSEC) 20 MG capsule Take 1 capsule (20 mg total) by mouth daily. 90 capsule 3   polyethylene glycol powder (GLYCOLAX/MIRALAX) 17 GM/SCOOP powder Take 1 Container by mouth once.     potassium chloride (KLOR-CON) 10 MEQ tablet Take 10 mEq by mouth daily.     torsemide (DEMADEX) 5 MG tablet  Take 1 tablet (5 mg total) by mouth daily. 90 tablet 0  valsartan (DIOVAN) 160 MG tablet Take 1 tablet (160 mg total) by mouth daily. 90 tablet 3   No current facility-administered medications on file prior to visit.    BP (!) 140/70   Pulse (!) 41   Temp 98.1 F (36.7 C) (Oral)   Ht 5\' 7"  (1.702 m)   Wt 183 lb (83 kg)   SpO2 93%   BMI 28.66 kg/m       Objective:   Physical Exam Vitals and nursing note reviewed.  Constitutional:      Appearance: Normal appearance. He is obese.  Cardiovascular:     Rate and Rhythm: Normal rate and regular rhythm.     Pulses: Normal pulses.     Heart sounds: Normal heart sounds.  Pulmonary:     Effort: Pulmonary effort is normal.     Breath sounds: Normal breath sounds. No stridor. No wheezing or rhonchi.  Musculoskeletal:        General: Normal range of motion.     Right lower leg: No edema.     Left lower leg: No edema.  Skin:    General: Skin is warm and dry.     Capillary Refill: Capillary refill takes less than 2 seconds.  Neurological:     General: No focal deficit present.     Mental Status: He is alert and oriented to person, place, and time.  Psychiatric:        Mood and Affect: Mood normal.        Behavior: Behavior normal.        Thought Content: Thought content normal.        Judgment: Judgment normal.        Assessment & Plan:  1. Community acquired pneumonia of right lower lobe of lung (Primary) -Reviewed hospital notes, discharge instructions, labs, imaging, medication changes.  All questions answered to the best of my ability. -Lungs clear throughout on exam today.  Continue with antibiotics until finished.  Will repeat CT chest in 1 month - CBC; Future - Basic Metabolic Panel; Future - Phosphorus; Future - CT Chest Wo Contrast; Future  2. Elevated troponin - Due to demand ischemia   3. Cirrhosis of liver without ascites, unspecified hepatic cirrhosis type (HCC) - Continue to monitor   4. AKI (acute  kidney injury) (HCC) - Continue to stay hydrated - CBC; Future - Basic Metabolic Panel; Future - Phosphorus; Future  5. Essential hypertension - At goal for age - Continue with Diovan and Norvasc - CBC; Future - Basic Metabolic Panel; Future - Phosphorus; Future  6. Type 2 diabetes mellitus with hyperglycemia, with long-term current use of insulin (HCC) - Continue with Jardiance. Will recheck BMP - CBC; Future - Basic Metabolic Panel; Future - Phosphorus; Future - POC HgB A1c- 7.1 - Accu-Chek Softclix Lancets lancets; Use as instructed  Dispense: 100 each; Refill: 12  7. Hyperlipidemia, unspecified hyperlipidemia type - Refuses statin  - CBC; Future - Basic Metabolic Panel; Future - Phosphorus; Future  8. History of pulmonary embolus (PE) - Continue with anticoagulation  - CBC; Future - Basic Metabolic Panel; Future - Phosphorus; Future  9. History of prostate cancer - In remission - CBC; Future - Basic Metabolic Panel; Future - Phosphorus; Future  10. Hypophosphatemia - completed therapy - CBC; Future - Basic Metabolic Panel; Future - Phosphorus; Future  11. Delirium - Resolved  Shirline Frees, NP

## 2023-12-10 NOTE — Transitions of Care (Post Inpatient/ED Visit) (Signed)
 12/10/2023  Name: Dakota Lewis MRN: [id number] DOB: 02/21/1933 Late Entry documentation  approved by Marja Kays RN.  Assessment completed on 12/08/2023 Today's TOC FU Call Status: Today's TOC FU Call Status:: Successful TOC FU Call Completed TOC FU Call Complete Date: 12/08/23 Patient's Name and Date of Birth confirmed.  Transition Care Management Follow-up Telephone Call Date of Discharge: 12/06/23 Discharge Facility: [facility]) Type of Discharge: Inpatient Admission Primary Inpatient Discharge Diagnosis:: sepsis How have you been since you were released from the hospital?: Better Any questions or concerns?: No  Items Reviewed: Did you receive and understand the discharge instructions provided?: Yes Medications obtained,verified, and reconciled?: Yes (Medications Reviewed) Any new allergies since your discharge?: No Dietary orders reviewed?: Yes Type of Diet Ordered:: Diet - low sodium heart healthy,  Carb Modified Do you have support at home?: Yes People in Home: child(ren), adult Name of Support/Comfort Primary Source: Harriett Sine  Medications Reviewed Today: Medications Reviewed Today     Reviewed by Earlie Server, RN (Registered Nurse) on 12/08/23 at 1550  Med List Status: <None>   Medication Order Taking? Sig Documenting Provider Last Dose Status Informant  acetaminophen (TYLENOL) 325 MG tablet [id number] Yes Take 2 tablets (650 mg total) by mouth every 6 (six) hours as needed for mild pain (pain score 1-3) (or Fever >/= 101). Barnetta Chapel, MD Taking Active   amLODipine (NORVASC) 10 MG tablet [id number] Yes Take 1 tablet (10 mg total) by mouth daily. Nafziger, Kandee Keen, NP Taking Active Child  amoxicillin-clavulanate (AUGMENTIN) 875-125 MG tablet [id number] Yes Take 1 tablet by mouth 2 (two) times daily for 5 days. Barnetta Chapel, MD Taking Active   apixaban (ELIQUIS) 5 MG TABS tablet [id number] Yes Take 5 mg by mouth 2 (two) times daily. [provider]  Taking Active Child  doxycycline (VIBRA-TABS) 100 MG tablet [id number] Yes Take 1 tablet (100 mg total) by mouth every 12 (twelve) hours for 5 days. Barnetta Chapel, MD Taking Active   DULoxetine (CYMBALTA) 60 MG capsule [id number] Yes Take 1 capsule (60 mg total) by mouth daily. Nafziger, Kandee Keen, NP Taking Active Child  empagliflozin (JARDIANCE) 25 MG TABS tablet [id number] Yes Take by mouth daily. Taking 1/2 tab daily [provider] Taking Active Child  fluticasone (FLONASE) 50 MCG/ACT nasal spray [id number] Yes Place 1 spray into both nostrils daily as needed for allergies. [provider] Taking Active Child           Med Note (SATTERFIELD, Genoveva Ill   Thu Dec 04, 2023  4:23 PM) Still has on hand   Ipratropium-Albuterol (COMBIVENT RESPIMAT) 20-100 MCG/ACT AERS respimat [id number] Yes Inhale 1 puff into the lungs every 6 (six) hours. Barnetta Chapel, MD Taking Active   omeprazole (PRILOSEC) 20 MG capsule [id number] Yes Take 1 capsule (20 mg total) by mouth daily. Shirline Frees, NP Taking Active Child  phosphorus (K PHOS NEUTRAL) 174-081-448 MG tablet [id number] Yes Take 2 tablets (500 mg total) by mouth 3 (three) times daily for 4 doses. Barnetta Chapel, MD Taking Active   torsemide [facility]) 5 MG tablet [id number] Yes Take 1 tablet (5 mg total) by mouth daily. Nafziger, Kandee Keen, NP Taking Active Child  valsartan (DIOVAN) 160 MG tablet [id number] Yes Take 1 tablet (160 mg total) by mouth daily. Shirline Frees, NP Taking Active Child            Home Care and Equipment/Supplies: Were Home Health Services Ordered?: No Any new equipment or medical supplies ordered?: No  Functional Questionnaire: Do you need assistance with bathing/showering or dressing?: No Do you need assistance with meal preparation?: No Do you need assistance with eating?: No Do you have difficulty maintaining continence: No Do you need assistance with getting out of bed/getting out of a chair/moving?:  No Do you have difficulty managing or taking your medications?: Yes (family)  Follow up appointments reviewed: PCP Follow-up appointment confirmed?: Yes Date of PCP follow-up appointment?: 12/09/23 Follow-up Provider: [facility] Follow-up appointment confirmed?: NA Do you need transportation to your follow-up appointment?: No (daughter drives to appointments.) Do you understand care options if your condition(s) worsen?: Yes-patient verbalized understanding  SDOH Interventions Today    Flowsheet Row Most Recent Value  SDOH Interventions   Food Insecurity Interventions Intervention Not Indicated  Housing Interventions Intervention Not Indicated  Transportation Interventions Intervention Not Indicated  Utilities Interventions Intervention Not Indicated      Spoke with daughter who is care giver for patient..Patient lives with daughter. Daughter, Harriett Sine stated that she was informed that patient would be discharged the next day.  She inquired if patient could be discharged on the date of current discharged due to patient living 1.5 hours away from the hospital,  so patient was.  After discharge pharmacies were closed and daughter had difficulty getting medications.  Daughter reports she has not yet reviewed discharge packet but was not told of any changes to medications. Spent along time reviewing changed medications. Patient has PCP follow up planned for tomorrow an daughter will take d/c packet with her to appointment to get clarification. .   Reviewed and offered 30 day TOC program and daughter has accepted.    Goals Addressed               This Visit's Progress     TOC care plan:  Paitent will report no readmissions to the hospital in the next 30 days. (pt-stated)        Current Barriers:  Medication management daughter with question about medications that were discontinued or stopped.  Recent admission for sepsis and CAP  RNCM Clinical Goal(s):  Patient  will work with the Care Management team over the next 30 days to address Transition of Care Barriers: Medication Management Provider appointments take all medications exactly as prescribed and will call provider for medication related questions as evidenced by report from daughter. attend all scheduled medical appointments: PC and specialist as evidenced by patient/ daughter reports and or review of EMR  through collaboration with RN Care manager, provider, and care team.   Interventions: Evaluation of current treatment plan related to  self management and patient's adherence to plan as established by provider  Transitions of Care:  New goal. Doctor Visits  - discussed the importance of doctor visits Reviewed Signs and symptoms of infection Review of discharge instruction and all medications. Encouraged daughter to discuss concerns and changes at OV in a few days.   Patient Goals/Self-Care Activities: Participate in Transition of Care Program/Attend [facility] scheduled calls Notify RN Care Manager of TOC call rescheduling needs Take all medications as prescribed Call provider office for new concerns or questions   Follow Up Plan:  Telephone follow up appointment with care management team member scheduled for:  12/16/2023 The patient has been provided with contact information for the care management team and has been advised to call with any health related questions or concerns.           Lonia Chimera, RN, BSN, [employer]- Transition of  Care Team.  [facility] [phone]

## 2023-12-13 ENCOUNTER — Other Ambulatory Visit: Payer: Self-pay | Admitting: Adult Health

## 2023-12-15 ENCOUNTER — Other Ambulatory Visit

## 2023-12-16 ENCOUNTER — Telehealth: Payer: Self-pay | Admitting: *Deleted

## 2023-12-16 ENCOUNTER — Other Ambulatory Visit: Payer: Self-pay

## 2023-12-16 NOTE — Progress Notes (Signed)
 Complex Care Management Care Guide Note  12/16/2023 Name: Dakota Lewis MRN: [id number] DOB: 1933-05-13  Dakota Lewis is a 89 y.o. year old male who is a primary care patient of Nafziger, Kandee Keen, NP and is actively engaged with the care management team. I reached out to Dakota Lewis by phone today to assist with re-scheduling  with the  Rowe Pavy RN .  Follow up plan:Patient daughter [name] stated patent wanted to follow up with Primary Care Physician and opt out of Transition of Care Calls.  No further outreach attempts will be made at this time.   Gwenevere Ghazi  [facility]  [facility], [facility] Guide  Direct Dial: [phone]  Fax [phone]

## 2023-12-18 ENCOUNTER — Telehealth: Payer: Self-pay

## 2023-12-18 NOTE — Patient Outreach (Signed)
 Care Management  Transitions of Care Program Transitions of Care Post-discharge week 2   12/18/2023 Name: Dakota Lewis MRN: [id number] DOB: 1932/12/10  Subjective: Dakota Lewis is a 89 y.o. year old male who is a primary care patient of Shirline Frees, NP. The Care Management team Engaged with patient Engaged with patient by telephone to assess and address transitions of care needs.   Consent to Services:  Patient was given information about care management services, agreed to services, and gave verbal consent to participate.   Assessment: Message received from Care guide that patient wanted to dis enroll in the [facility] program.  Program closed           SDOH Interventions    Flowsheet Row Telephone from 12/08/2023 in [place] [facility] DEPARTMENT Clinical Support from 12/20/2022 in [facility] Coordination from 12/11/2022 in [facility] Telephone from 08/16/2022 in [facility] [employer] [facility] Care Coordination Office Visit from 02/27/2022 in [facility] [place]  SDOH Interventions       Food Insecurity Interventions Intervention Not Indicated Intervention Not Indicated Intervention Not Indicated Intervention Not Indicated --  Housing Interventions Intervention Not Indicated Intervention Not Indicated Intervention Not Indicated -- --  Transportation Interventions Intervention Not Indicated Intervention Not Indicated Intervention Not Indicated Intervention Not Indicated  [patient drives self short distances,  daughter provides transportation to provider appointments] --  Utilities Interventions Intervention Not Indicated Intervention Not Indicated Intervention Not Indicated -- --  Alcohol Usage Interventions -- Intervention Not Indicated (Score <7) -- -- --  Depression Interventions/Treatment  -- -- -- -- Counseling  Financial Strain Interventions -- Intervention Not Indicated -- -- --  Physical Activity  Interventions -- Intervention Not Indicated -- -- --  Stress Interventions -- Intervention Not Indicated -- -- --  Social Connections Interventions -- Intervention Not Indicated -- -- --          Plan: The patient has been provided with contact information for the care management team and has been advised to call with any health related questions or concerns.    Lonia Chimera, RN, BSN, CEN [employer]- Transition of Care Team.  [facility] [phone]

## 2023-12-19 ENCOUNTER — Other Ambulatory Visit

## 2023-12-19 ENCOUNTER — Ambulatory Visit (HOSPITAL_BASED_OUTPATIENT_CLINIC_OR_DEPARTMENT_OTHER)
Admission: RE | Admit: 2023-12-19 | Discharge: 2023-12-19 | Disposition: A | Discharge status: Home / Self Care | Source: Ambulatory Visit | Attending: Adult Health | Admitting: Adult Health

## 2023-12-19 DIAGNOSIS — N179 Acute kidney failure, unspecified: Secondary | ICD-10-CM

## 2023-12-19 DIAGNOSIS — Z8546 Personal history of malignant neoplasm of prostate: Secondary | ICD-10-CM

## 2023-12-19 DIAGNOSIS — E785 Hyperlipidemia, unspecified: Secondary | ICD-10-CM

## 2023-12-19 DIAGNOSIS — J189 Pneumonia, unspecified organism: Secondary | ICD-10-CM

## 2023-12-19 DIAGNOSIS — I700 Atherosclerosis of aorta: Secondary | ICD-10-CM | POA: Diagnosis not present

## 2023-12-19 DIAGNOSIS — E1165 Type 2 diabetes mellitus with hyperglycemia: Secondary | ICD-10-CM

## 2023-12-19 DIAGNOSIS — R918 Other nonspecific abnormal finding of lung field: Secondary | ICD-10-CM | POA: Diagnosis not present

## 2023-12-19 DIAGNOSIS — I10 Essential (primary) hypertension: Secondary | ICD-10-CM

## 2023-12-19 DIAGNOSIS — Z794 Long term (current) use of insulin: Secondary | ICD-10-CM | POA: Diagnosis not present

## 2023-12-19 DIAGNOSIS — R590 Localized enlarged lymph nodes: Secondary | ICD-10-CM | POA: Diagnosis not present

## 2023-12-19 DIAGNOSIS — Z86711 Personal history of pulmonary embolism: Secondary | ICD-10-CM | POA: Diagnosis not present

## 2023-12-19 LAB — BASIC METABOLIC PANEL
BUN: 18 mg/dL (ref 6–23)
CO2: 27 meq/L (ref 19–32)
Calcium: 9.9 mg/dL (ref 8.4–10.5)
Chloride: 101 meq/L (ref 96–112)
Creatinine, Ser: 1.11 mg/dL (ref 0.40–1.50)
GFR: 59.08 mL/min — ABNORMAL LOW (ref >60.00)
Glucose, Bld: 136 mg/dL — ABNORMAL HIGH (ref 70–99)
Potassium: 4 meq/L (ref 3.5–5.1)
Sodium: 138 meq/L (ref 135–145)

## 2023-12-19 LAB — CBC
HCT: 40 % (ref 39.0–52.0)
Hemoglobin: 13.6 g/dL (ref 13.0–17.0)
MCHC: 34 g/dL (ref 30.0–36.0)
MCV: 89.3 fl (ref 78.0–100.0)
Platelets: 276 10*3/uL (ref 150.0–400.0)
RBC: 4.48 Mil/uL (ref 4.22–5.81)
RDW: 13.8 % (ref 11.5–15.5)
WBC: 5.2 10*3/uL (ref 4.0–10.5)

## 2023-12-19 LAB — PHOSPHORUS: Phosphorus: 3.3 mg/dL (ref 2.3–4.6)

## 2023-12-23 ENCOUNTER — Other Ambulatory Visit: Payer: Self-pay | Admitting: Adult Health

## 2023-12-23 DIAGNOSIS — I10 Essential (primary) hypertension: Secondary | ICD-10-CM

## 2023-12-24 NOTE — Telephone Encounter (Signed)
 Okay for refill?

## 2024-01-05 ENCOUNTER — Telehealth: Payer: Self-pay

## 2024-01-05 NOTE — Telephone Encounter (Signed)
 Copied from CRM [id number]. Topic: Clinical - Medical Advice >> Jan 05, 2024  4:01 PM Adaysia C wrote: Reason for CRM: Patients daughter Enrique Sack) called in to request a call back from [place] as soon as possible but did not provide the reason why; please follow up with patients daughter [phone]

## 2024-01-06 ENCOUNTER — Other Ambulatory Visit: Payer: Self-pay | Admitting: Adult Health

## 2024-01-06 MED ORDER — DOXYCYCLINE HYCLATE 100 MG PO CAPS: 100.0000 mg | ORAL_CAPSULE | Freq: Two times a day (BID) | ORAL | 0 refills | Status: DC

## 2024-01-06 NOTE — Telephone Encounter (Signed)
 Noted.

## 2024-01-06 NOTE — Telephone Encounter (Signed)
 Spoke to pt daughter and she stated that pt still having the same sx. She also stated that her and sister looked at pt CT scan and think that pt may need another round of abx. Pt CT scans are ready for viewing.

## 2024-01-22 ENCOUNTER — Other Ambulatory Visit: Payer: Self-pay | Admitting: Adult Health

## 2024-01-28 ENCOUNTER — Ambulatory Visit (INDEPENDENT_AMBULATORY_CARE_PROVIDER_SITE_OTHER): Admitting: Family Medicine

## 2024-01-28 ENCOUNTER — Encounter: Payer: Self-pay | Admitting: Family Medicine

## 2024-01-28 VITALS — BP 126/78 | HR 89 | Temp 98.1°F | Wt 181.4 lb

## 2024-01-28 DIAGNOSIS — J189 Pneumonia, unspecified organism: Secondary | ICD-10-CM | POA: Diagnosis not present

## 2024-01-28 MED ORDER — AZITHROMYCIN 250 MG PO TABS: ORAL_TABLET | ORAL | 0 refills | Status: DC

## 2024-01-28 NOTE — Progress Notes (Signed)
   Subjective:    Patient ID: Dakota Lewis, male    DOB: 1933/07/07, 89 y.o.   MRN: [id number]  HPI Here for a cough that will not go away. He was hospitalized from 12-04-23 to 12-06-23 for what was found to be a multifocal pneumonia in the RLL and RML. This was seen on two different chest CT scans. He never had a fever. He feels tried and mildly SOB. Since coming home from the hospital he has taken two 7 day courses of Doxycycline  and one 7 day course of Augmentin . These helped him feel a little better, but he still has a hard cough that produces white sputum. His oxygen sats have been normal since his DC home.    Review of Systems  Constitutional:  Positive for fatigue. Negative for chills and fever.  Respiratory:  Positive for cough and chest tightness. Negative for shortness of breath and wheezing.   Cardiovascular: Negative.        Objective:   Physical Exam Constitutional:      Appearance: Normal appearance.  Cardiovascular:     Rate and Rhythm: Normal rate and regular rhythm.     Pulses: Normal pulses.     Heart sounds: Normal heart sounds.  Pulmonary:     Effort: Pulmonary effort is normal.     Breath sounds: Normal breath sounds.  Neurological:     Mental Status: He is alert.           Assessment & Plan:  He has a lingering multifocal pneumonia that is likely to be atypical. We will treat with a Zpack. He will also take 1200 mg of Mucinex  BID. Recheck as needed. We spent a total of ( 34  ) minutes reviewing records and discussing these issues.  Corita Diego, MD

## 2024-02-23 ENCOUNTER — Other Ambulatory Visit: Payer: Self-pay | Admitting: Adult Health

## 2024-02-23 ENCOUNTER — Other Ambulatory Visit: Payer: Self-pay | Admitting: Family Medicine

## 2024-02-23 DIAGNOSIS — M150 Primary generalized (osteo)arthritis: Secondary | ICD-10-CM

## 2024-02-23 DIAGNOSIS — I10 Essential (primary) hypertension: Secondary | ICD-10-CM

## 2024-03-03 DIAGNOSIS — L820 Inflamed seborrheic keratosis: Secondary | ICD-10-CM | POA: Diagnosis not present

## 2024-03-03 DIAGNOSIS — L570 Actinic keratosis: Secondary | ICD-10-CM | POA: Diagnosis not present

## 2024-03-03 DIAGNOSIS — D485 Neoplasm of uncertain behavior of skin: Secondary | ICD-10-CM | POA: Diagnosis not present

## 2024-03-03 DIAGNOSIS — C44629 Squamous cell carcinoma of skin of left upper limb, including shoulder: Secondary | ICD-10-CM | POA: Diagnosis not present

## 2024-03-03 DIAGNOSIS — C44622 Squamous cell carcinoma of skin of right upper limb, including shoulder: Secondary | ICD-10-CM | POA: Diagnosis not present

## 2024-03-24 ENCOUNTER — Ambulatory Visit (INDEPENDENT_AMBULATORY_CARE_PROVIDER_SITE_OTHER): Admitting: Adult Health

## 2024-03-24 ENCOUNTER — Encounter: Payer: Self-pay | Admitting: Adult Health

## 2024-03-24 VITALS — BP 120/86 | HR 104 | Temp 97.8°F | Ht 67.0 in | Wt 181.4 lb

## 2024-03-24 DIAGNOSIS — K429 Umbilical hernia without obstruction or gangrene: Secondary | ICD-10-CM

## 2024-03-24 NOTE — Progress Notes (Signed)
 Subjective:    Patient ID: Dakota Lewis, male    DOB: 02-10-1933, 89 y.o.   MRN: [id number]  HPI  89 year old male who  has a past medical history of Bilateral pulmonary embolism (HCC), Chronic cough, DM (diabetes mellitus) (HCC), DVT (deep venous thrombosis) (HCC), Gastrointestinal bleed, H/O cardiovascular stress test (05/07/2011), H/O Doppler ultrasound (2013), H/O Doppler ultrasound (2012), H/O echocardiogram (03/14/2011), H/O exercise stress test (2006), Hiatal hernia, Hypertension, and Prostate cancer (HCC).  He presents to the office today with his daughter for concern of an umbilical hernia.  He has previous umbilical hernia repair in the 1990s.  He reports that few days ago his hernia was protruding quite extensively had turned blue.  He did not have constipation at this time.  Reports that he will have pain periodically at the site of the hernia but that this pain does not last very long.  Hernia has since retracted.  He would like a referral to see a surgeon   Review of Systems See HPI   Past Medical History:  Diagnosis Date   Bilateral pulmonary embolism (HCC)    lovenox & coumadin thearpy   Chronic cough    DM (diabetes mellitus) (HCC)    DVT (deep venous thrombosis) (HCC)    left lower   Gastrointestinal bleed    prior   H/O cardiovascular stress test 05/07/2011   normal study, low risk scan   H/O Doppler ultrasound 2013   venous duplex doppler   H/O Doppler ultrasound 2012   venous duplex doppler   H/O echocardiogram 03/14/2011   EF 65-70%   H/O exercise stress test 2006   neg bruce protocol excercise stress test   Hiatal hernia    Hypertension    Prostate cancer [facility])    s/p radiation    Social History   Socioeconomic History   Marital status: Married    Spouse name: Not on file   Number of children: 3   Years of education: Not on file   Highest education level: Not on file  Occupational History   Not on file  Tobacco Use   Smoking status: Former     Current packs/day: 0.75    Average packs/day: 0.8 packs/day for 20.0 years (15.0 ttl pk-yrs)    Types: Cigarettes    Passive exposure: Past   Smokeless tobacco: Never  Vaping Use   Vaping status: Never Used  Substance and Sexual Activity   Alcohol use: No   Drug use: No   Sexual activity: Not on file  Other Topics Concern   Not on file  Social History Narrative   Not on file   Social Drivers of Health   Financial Resource Strain: Low Risk  (12/20/2022)   Overall Financial Resource Strain (CARDIA)    Difficulty of Paying Living Expenses: Not hard at all  Food Insecurity: No Food Insecurity (12/08/2023)   Hunger Vital Sign    Worried About Running Out of Food in the Last Year: Never true    Ran Out of Food in the Last Year: Never true  Transportation Needs: No Transportation Needs (12/08/2023)   PRAPARE - Administrator, Civil Service (Medical): No    Lack of Transportation (Non-Medical): No  Physical Activity: Insufficiently Active (12/20/2022)   Exercise Vital Sign    Days of Exercise per Week: 5 days    Minutes of Exercise per Session: 20 min  Stress: No Stress Concern Present (12/20/2022)   [employer] of  Occupational Health - Occupational Stress Questionnaire    Feeling of Stress : Not at all  Social Connections: Moderately Integrated (12/04/2023)   Social Connection and Isolation Panel    Frequency of Communication with Friends and Family: More than three times a week    Frequency of Social Gatherings with Friends and Family: More than three times a week    Attends Religious Services: More than 4 times per year    Active Member of [employer] or Organizations: Yes    Attends Banker Meetings: More than 4 times per year    Marital Status: Widowed  Intimate Partner Violence: Patient Unable To Answer (12/08/2023)   Humiliation, Afraid, Rape, and Kick questionnaire    Fear of Current or Ex-Partner: Patient unable to answer    Emotionally Abused: Patient  unable to answer    Physically Abused: Patient unable to answer    Sexually Abused: Patient unable to answer    Past Surgical History:  Procedure Laterality Date   AMPUTATION Left 03/21/2022   Procedure: LEFT RING FINGER AND LEFT SMALL FINGER REVISION AMPUTATION;  Surgeon: Brunilda Capra, MD;  Location: [facility] OR;  Service: Orthopedics;  Laterality: Left;   event monitor     2012   HERNIA REPAIR Bilateral    1991, 1992   HIATAL HERNIA REPAIR     1997   IVC FILTER INSERTION     PROSTATE SURGERY      Family History  Problem Relation Age of Onset   Stroke Mother    Kidney disease Father    Heart disease Brother    Arthritis Brother    Heart disease Brother    Prostate cancer Brother     Allergies  Allergen Reactions   Lidocaine     ANTI ITCH CREAM, NO SPECIFICATIONS IN RECORDS.   Lisinopril Cough   Metformin  Diarrhea    Current Outpatient Medications on File Prior to Visit  Medication Sig Dispense Refill   ACCU-CHEK GUIDE TEST test strip use 4 TIMES DAILY 300 strip 3   Accu-Chek Softclix Lancets lancets Use as instructed 100 each 12   acetaminophen  (TYLENOL ) 325 MG tablet Take 2 tablets (650 mg total) by mouth every 6 (six) hours as needed for mild pain (pain score 1-3) (or Fever >/= 101).     amLODipine  (NORVASC ) 10 MG tablet TAKE ONE TABLET BY MOUTH DAILY 90 tablet 1   apixaban  (ELIQUIS ) 5 MG TABS tablet Take 5 mg by mouth 2 (two) times daily.     azithromycin  (ZITHROMAX ) 250 MG tablet TAKE 2 TABLETS BY MOUTH ON DAY 1 THEN TAKE 1 TABLET DAILY ON DAYS 2 - 5 6 tablet 0   cholecalciferol (VITAMIN D3) 25 MCG (1000 UNIT) tablet Take 2,000 Units by mouth daily.     DULoxetine  (CYMBALTA ) 60 MG capsule Take 1 capsule (60 mg total) by mouth daily. 90 capsule 0   empagliflozin (JARDIANCE) 25 MG TABS tablet Take by mouth daily. Taking 1/2 tab daily     fluticasone  (FLONASE ) 50 MCG/ACT nasal spray Place 1 spray into both nostrils daily as needed for allergies.     Ipratropium-Albuterol   (COMBIVENT  RESPIMAT) 20-100 MCG/ACT AERS respimat Inhale 1 puff into the lungs every 6 (six) hours. 4 g 0   LANTUS  SOLOSTAR 100 UNIT/ML Solostar Pen INJECT 10 UNITS SUBCUTANEOUSLY DAILY 15 mL 0   Multiple Vitamins-Minerals (ICAPS AREDS 2 PO) Take by mouth.     omeprazole  (PRILOSEC) 20 MG capsule Take 1 capsule (20 mg total) by mouth  daily. 90 capsule 3   polyethylene glycol powder (GLYCOLAX/MIRALAX) 17 GM/SCOOP powder Take 1 Container by mouth once.     potassium chloride  (KLOR-CON ) 10 MEQ tablet Take 10 mEq by mouth daily.     potassium chloride  (MICRO-K ) 10 MEQ CR capsule Take 1 capsule by mouth daily. SCHEDULE OFFICE VISIT FOR FUTURE REFILLS 30 capsule 0   torsemide  (DEMADEX ) 5 MG tablet TAKE ONE TABLET BY MOUTH DAILY 90 tablet 1   valsartan  (DIOVAN ) 160 MG tablet Take 1 tablet (160 mg total) by mouth daily. 90 tablet 3   No current facility-administered medications on file prior to visit.    BP 120/86 (BP Location: Left Arm, Patient Position: Sitting, Cuff Size: Normal)   Pulse (!) 104   Temp 97.8 F (36.6 C) (Oral)   Ht 5' 7 (1.702 m)   Wt 181 lb 6.4 oz (82.3 kg)   BMI 28.41 kg/m       Objective:   Physical Exam Vitals and nursing note reviewed.  Constitutional:      Appearance: Normal appearance.  Abdominal:     General: Bowel sounds are normal.     Tenderness: There is abdominal tenderness in the periumbilical area.     Hernia: A hernia is present. Hernia is present in the umbilical area.   Musculoskeletal:        General: Normal range of motion.   Skin:    General: Skin is warm and dry.   Neurological:     General: No focal deficit present.     Mental Status: He is alert and oriented to person, place, and time.   Psychiatric:        Mood and Affect: Mood normal.        Behavior: Behavior normal.        Thought Content: Thought content normal.        Judgment: Judgment normal.        Assessment & Plan:  1. Umbilical hernia without obstruction and without  gangrene (Primary)  - Ambulatory referral to General Surgery

## 2024-03-31 DIAGNOSIS — C44629 Squamous cell carcinoma of skin of left upper limb, including shoulder: Secondary | ICD-10-CM | POA: Diagnosis not present

## 2024-04-21 ENCOUNTER — Other Ambulatory Visit: Payer: Self-pay | Admitting: Adult Health

## 2024-04-28 DIAGNOSIS — D485 Neoplasm of uncertain behavior of skin: Secondary | ICD-10-CM | POA: Diagnosis not present

## 2024-04-28 DIAGNOSIS — C44629 Squamous cell carcinoma of skin of left upper limb, including shoulder: Secondary | ICD-10-CM | POA: Diagnosis not present

## 2024-04-28 DIAGNOSIS — C44622 Squamous cell carcinoma of skin of right upper limb, including shoulder: Secondary | ICD-10-CM | POA: Diagnosis not present

## 2024-04-28 DIAGNOSIS — D0462 Carcinoma in situ of skin of left upper limb, including shoulder: Secondary | ICD-10-CM | POA: Diagnosis not present

## 2024-05-06 ENCOUNTER — Telehealth: Payer: Self-pay

## 2024-05-06 DIAGNOSIS — K432 Incisional hernia without obstruction or gangrene: Secondary | ICD-10-CM | POA: Diagnosis not present

## 2024-05-06 DIAGNOSIS — Z7901 Long term (current) use of anticoagulants: Secondary | ICD-10-CM | POA: Diagnosis not present

## 2024-05-06 DIAGNOSIS — M6208 Separation of muscle (nontraumatic), other site: Secondary | ICD-10-CM | POA: Diagnosis not present

## 2024-05-06 NOTE — Telephone Encounter (Signed)
   Pre-operative Risk Assessment    Patient Name: Dakota Lewis  DOB: 05-Sep-1933 MRN: [id number]   Date of last office visit: 12/09/17 DORN LESCHES, MD Date of next office visit: NONE   Request for Surgical Clearance    Procedure:  UMBILICAL HERNIA SURGERY  Date of Surgery:  Clearance TBD                                Surgeon:  HERLENE BUREAU, MD Surgeon's Group or Practice Name:  [facility] Phone number:  [phone] Fax number:  [phone]    ATTN: LARRAINE ELLEN, CMA   Type of Clearance Requested:   - Medical  - Pharmacy:  Hold Apixaban  (Eliquis )     Type of Anesthesia:  General    Additional requests/questions:    Signed, Lucie DELENA Ku   05/06/2024, 3:26 PM

## 2024-05-11 ENCOUNTER — Other Ambulatory Visit: Payer: Self-pay | Admitting: Adult Health

## 2024-05-11 DIAGNOSIS — M150 Primary generalized (osteo)arthritis: Secondary | ICD-10-CM

## 2024-05-11 NOTE — Telephone Encounter (Signed)
 Patient with history of bilateral PE as well as DVT.  Has not been seen in cardiology since 2019.  Please reach out to PCP for clearance as they are most likely monitoring this.

## 2024-05-11 NOTE — Telephone Encounter (Signed)
    Primary Cardiologist:Dr. Court  Chart reviewed as part of pre-operative protocol coverage. Because of Dakota Lewis's past medical history and time since last visit, he/she will require a follow-up visit in order to better assess preoperative cardiovascular risk.  Pre-op covering staff: - Please schedule Office appointment and call patient to inform them. - Please contact requesting surgeon's office via preferred method (i.e, phone, fax) to inform them of need for appointment prior to surgery.  If applicable, this message will also be routed to pharmacy pool and/or primary cardiologist for input on holding anticoagulant/antiplatelet agent as requested below so that this information is available at time of patient's appointment.   [name], NP  05/11/2024, 3:09 PM \

## 2024-05-11 NOTE — Telephone Encounter (Signed)
 Left message for pt to call our office and schedule IN OFFICE Preop appt.

## 2024-05-13 ENCOUNTER — Telehealth: Payer: Self-pay

## 2024-05-13 NOTE — Telephone Encounter (Signed)
 Spoke to patient's daughter, Inocente (on his release forms), she informed me that her father has decided to cancel his procedure and put it off indefinatly. I told her I would let our team know her father's decision.

## 2024-05-17 DIAGNOSIS — C44629 Squamous cell carcinoma of skin of left upper limb, including shoulder: Secondary | ICD-10-CM | POA: Diagnosis not present

## 2024-05-17 DIAGNOSIS — C44621 Squamous cell carcinoma of skin of unspecified upper limb, including shoulder: Secondary | ICD-10-CM | POA: Diagnosis not present

## 2024-06-21 DIAGNOSIS — S9002XA Contusion of left ankle, initial encounter: Secondary | ICD-10-CM | POA: Diagnosis not present

## 2024-06-21 DIAGNOSIS — S40022A Contusion of left upper arm, initial encounter: Secondary | ICD-10-CM | POA: Diagnosis not present

## 2024-06-21 DIAGNOSIS — S61511A Laceration without foreign body of right wrist, initial encounter: Secondary | ICD-10-CM | POA: Diagnosis not present

## 2024-06-21 DIAGNOSIS — W19XXXA Unspecified fall, initial encounter: Secondary | ICD-10-CM | POA: Diagnosis not present

## 2024-06-21 DIAGNOSIS — S93402A Sprain of unspecified ligament of left ankle, initial encounter: Secondary | ICD-10-CM | POA: Diagnosis not present

## 2024-06-22 NOTE — Progress Notes (Signed)
   06/22/2024  Patient ID: Dakota Lewis, male   DOB: 08/13/1933, 89 y.o.   MRN: [id number]  Pharmacy Quality Measure Review  This patient is appearing on a report for being at risk of failing the adherence measure for hypertension (ACEi/ARB) medications this calendar year.   Medication: Valsartan  Last fill date: 04/12/24 for 90 day supply  Insurance report was not up to date. No action needed at this time.   Jon VEAR [place], PharmD Clinical Pharmacist [phone]

## 2024-06-30 ENCOUNTER — Ambulatory Visit (INDEPENDENT_AMBULATORY_CARE_PROVIDER_SITE_OTHER): Admitting: Adult Health

## 2024-06-30 ENCOUNTER — Ambulatory Visit (INDEPENDENT_AMBULATORY_CARE_PROVIDER_SITE_OTHER)
Admission: RE | Admit: 2024-06-30 | Discharge: 2024-06-30 | Disposition: A | Discharge status: Home / Self Care | Source: Ambulatory Visit | Attending: Adult Health

## 2024-06-30 ENCOUNTER — Ambulatory Visit: Payer: Self-pay

## 2024-06-30 ENCOUNTER — Encounter: Payer: Self-pay | Admitting: Adult Health

## 2024-06-30 VITALS — BP 138/80 | HR 93 | Temp 97.8°F | Ht 67.0 in | Wt 179.0 lb

## 2024-06-30 DIAGNOSIS — M79605 Pain in left leg: Secondary | ICD-10-CM

## 2024-06-30 DIAGNOSIS — D0461 Carcinoma in situ of skin of right upper limb, including shoulder: Secondary | ICD-10-CM | POA: Diagnosis not present

## 2024-06-30 DIAGNOSIS — Z4802 Encounter for removal of sutures: Secondary | ICD-10-CM | POA: Diagnosis not present

## 2024-06-30 DIAGNOSIS — M79662 Pain in left lower leg: Secondary | ICD-10-CM | POA: Diagnosis not present

## 2024-06-30 DIAGNOSIS — M25572 Pain in left ankle and joints of left foot: Secondary | ICD-10-CM

## 2024-06-30 DIAGNOSIS — C44629 Squamous cell carcinoma of skin of left upper limb, including shoulder: Secondary | ICD-10-CM | POA: Diagnosis not present

## 2024-06-30 DIAGNOSIS — L570 Actinic keratosis: Secondary | ICD-10-CM | POA: Diagnosis not present

## 2024-06-30 DIAGNOSIS — D485 Neoplasm of uncertain behavior of skin: Secondary | ICD-10-CM | POA: Diagnosis not present

## 2024-06-30 NOTE — Telephone Encounter (Signed)
 FYI Only or Action Required?: FYI only for provider.  Patient was last seen in primary care on 03/24/2024 by Merna Huxley, NP.  Called Nurse Triage reporting Fall.  Symptoms began a week ago.  Interventions attempted: Nothing.  Symptoms are: gradually worsening.  Triage Disposition: See Physician Within 24 Hours  Patient/caregiver understands and will follow disposition?: Yes     Copied from CRM #[id number]. Topic: Clinical - Red Word Triage >> Jun 30, 2024 11:53 AM [place] A wrote: [facility] that prompted transfer to Nurse Triage: Patient fell off ladder cut his wrist, sprain ankle and left arm is bruised. Reason for Disposition  [1] MODERATE pain (e.g., interferes with normal activities, limping) AND [2] high-risk adult (e.g., age > 60 years, osteoporosis, chronic steroid use)  Answer Assessment - Initial Assessment Questions This RN spoke with the patient's daughter Inocente regarding his symptoms. Patient has some continuing L Leg,  L heel and L foot are not better.  Had a sprained ankle and cut on wrist. Went to UC after fall last week Monday. Also requesting to get stiches removed.    1. MECHANISM: How did the injury happen? (e.g., twisting injury, direct blow)      Fall  2. ONSET: When did the injury happen? (e.g., minutes, hours ago)      A few days ago  3. LOCATION: Where is the injury located?      L leg  4. APPEARANCE of INJURY: What does the injury look like?  (e.g., deformity of leg)     Has a yellow bruise on area  5. SIZE: For cuts, bruises, or swelling, ask: How large is it? (e.g., inches or centimeters)      States there is a huge yellow bruise in the area from ankle up 2/3 of the way 6. PAIN: Is there pain? If Yes, ask: How bad is the pain?   What does it keep you from doing? (Scale 0-10; or none, mild, moderate, severe)     Moderate to severe  Protocols used: Leg Injury-A-AH

## 2024-06-30 NOTE — Progress Notes (Addendum)
 Subjective:    Patient ID: CEEJAY KEGLEY, male    DOB: 1933-03-03, 89 y.o.   MRN: [id number]  HPI 89 year old male who  has a past medical history of Bilateral pulmonary embolism (HCC), Chronic cough, DM (diabetes mellitus) (HCC), DVT (deep venous thrombosis) (HCC), Gastrointestinal bleed, H/O cardiovascular stress test (05/07/2011), H/O Doppler ultrasound (2013), H/O Doppler ultrasound (2012), H/O echocardiogram (03/14/2011), H/O exercise stress test (2006), Hiatal hernia, Hypertension, and Prostate cancer (HCC).  He presents to the office today for follow up .   He reports that about 9 days ago he was on a ladder fixing some drywall. He went to take down a piece of drywall and fell off the ladder he was using. He was about 5 feet up in the air at the time. When he fell off the ladder his left leg got momentarily stuck in one of the ladder, which broke his fall.  He denied hitting his head or losing consciousness.  He did sustain a laceration to his right wrist and went to the urgent care and had sutures placed.  He reports that he continues to have pain along the heel and up the back of his leg.  He does have bruising noted.  He is able to ambulate but has pain with doing so.  Pain feels to be improving to some degree.   Review of Systems See HPI   Past Medical History:  Diagnosis Date   Bilateral pulmonary embolism (HCC)    lovenox & coumadin thearpy   Chronic cough    DM (diabetes mellitus) (HCC)    DVT (deep venous thrombosis) (HCC)    left lower   Gastrointestinal bleed    prior   H/O cardiovascular stress test 05/07/2011   normal study, low risk scan   H/O Doppler ultrasound 2013   venous duplex doppler   H/O Doppler ultrasound 2012   venous duplex doppler   H/O echocardiogram 03/14/2011   EF 65-70%   H/O exercise stress test 2006   neg bruce protocol excercise stress test   Hiatal hernia    Hypertension    Prostate cancer [facility])    s/p radiation    Social History    Socioeconomic History   Marital status: Married    Spouse name: Not on file   Number of children: 3   Years of education: Not on file   Highest education level: Not on file  Occupational History   Not on file  Tobacco Use   Smoking status: Former    Current packs/day: 0.75    Average packs/day: 0.8 packs/day for 20.0 years (15.0 ttl pk-yrs)    Types: Cigarettes    Passive exposure: Past   Smokeless tobacco: Never  Vaping Use   Vaping status: Never Used  Substance and Sexual Activity   Alcohol use: No   Drug use: No   Sexual activity: Not on file  Other Topics Concern   Not on file  Social History Narrative   Not on file   Social Drivers of Health   Financial Resource Strain: Low Risk  (12/20/2022)   Overall Financial Resource Strain (CARDIA)    Difficulty of Paying Living Expenses: Not hard at all  Food Insecurity: No Food Insecurity (12/08/2023)   Hunger Vital Sign    Worried About Running Out of Food in the Last Year: Never true    Ran Out of Food in the Last Year: Never true  Transportation Needs: No Transportation Needs (12/08/2023)  PRAPARE - Administrator, Civil Service (Medical): No    Lack of Transportation (Non-Medical): No  Physical Activity: Insufficiently Active (12/20/2022)   Exercise Vital Sign    Days of Exercise per Week: 5 days    Minutes of Exercise per Session: 20 min  Stress: No Stress Concern Present (12/20/2022)   [employer] of Occupational Health - Occupational Stress Questionnaire    Feeling of Stress : Not at all  Social Connections: Moderately Integrated (12/04/2023)   Social Connection and Isolation Panel    Frequency of Communication with Friends and Family: More than three times a week    Frequency of Social Gatherings with Friends and Family: More than three times a week    Attends Religious Services: More than 4 times per year    Active Member of [employer] or Organizations: Yes    Attends Banker Meetings:  More than 4 times per year    Marital Status: Widowed  Intimate Partner Violence: Patient Unable To Answer (12/08/2023)   Humiliation, Afraid, Rape, and Kick questionnaire    Fear of Current or Ex-Partner: Patient unable to answer    Emotionally Abused: Patient unable to answer    Physically Abused: Patient unable to answer    Sexually Abused: Patient unable to answer    Past Surgical History:  Procedure Laterality Date   AMPUTATION 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   Lisinopril Cough   Metformin  Diarrhea    Current Outpatient Medications on File Prior to Visit  Medication Sig Dispense Refill   ACCU-CHEK GUIDE TEST test strip use 4 TIMES DAILY 300 strip 3   Accu-Chek Softclix Lancets lancets Use as instructed 100 each 12   acetaminophen  (TYLENOL ) 325 MG tablet Take 2 tablets (650 mg total) by mouth every 6 (six) hours as needed for mild pain (pain score 1-3) (or Fever >/= 101).     amLODipine  (NORVASC ) 10 MG tablet TAKE ONE TABLET BY MOUTH DAILY 90 tablet 1   apixaban  (ELIQUIS ) 5 MG TABS tablet Take 5 mg by mouth 2 (two) times daily.     azithromycin  (ZITHROMAX ) 250 MG tablet TAKE 2 TABLETS BY MOUTH ON DAY 1 THEN TAKE 1 TABLET DAILY ON DAYS 2 - 5 6 tablet 0   cholecalciferol (VITAMIN D3) 25 MCG (1000 UNIT) tablet Take 2,000 Units by mouth daily.     DULoxetine  (CYMBALTA ) 60 MG capsule TAKE ONE CAPSULE BY  MOUTH DAILY 90 capsule 1   empagliflozin (JARDIANCE) 25 MG TABS tablet Take by mouth daily. Taking 1/2 tab daily     fluticasone  (FLONASE ) 50 MCG/ACT nasal spray Place 1 spray into both nostrils daily as needed for allergies.     Ipratropium-Albuterol  (COMBIVENT  RESPIMAT) 20-100 MCG/ACT  AERS respimat Inhale 1 puff into the lungs every 6 (six) hours. 4 g 0   LANTUS  SOLOSTAR 100 UNIT/ML Solostar Pen INJECT 10 UNITS SUBCUTANEOUSLY DAILY 15 mL 0   Multiple Vitamins-Minerals (ICAPS AREDS 2 PO) Take by mouth.     omeprazole  (PRILOSEC) 20 MG capsule Take 1 capsule (20 mg total) by mouth daily. 90 capsule 0   polyethylene glycol powder (GLYCOLAX/MIRALAX) 17 GM/SCOOP powder Take 1 Container by mouth once.     potassium chloride  (KLOR-CON ) 10 MEQ tablet Take 10 mEq by mouth daily.     potassium chloride  (MICRO-K ) 10 MEQ CR capsule Take 1 capsule by mouth daily. SCHEDULE OFFICE VISIT FOR FUTURE REFILLS 30 capsule 0   torsemide  (DEMADEX ) 5 MG tablet TAKE ONE TABLET BY MOUTH DAILY 90 tablet 1   valsartan  (DIOVAN ) 160 MG tablet Take 1 tablet (160 mg total) by mouth daily. 90 tablet 3   No current facility-administered medications on file prior to visit.    BP 138/80   Pulse 93   Temp 97.8 F (36.6 C) (Oral)   Ht 5' 7 (1.702 m)   Wt 179 lb (81.2 kg)   SpO2 95%   BMI 28.04 kg/m       Objective:   Physical Exam Vitals and nursing note reviewed.  Constitutional:      Appearance: Normal appearance.  Musculoskeletal:        General: Normal range of motion.     Left ankle: Swelling present. No deformity. Tenderness present. Normal range of motion. Normal pulse.     Left Achilles Tendon: Tenderness present. No defects. Thompson's test negative.       Legs:  Skin:    General: Skin is warm and dry.     Capillary Refill: Capillary refill takes less than 2 seconds.      Neurological:     General: No focal deficit present.     Mental Status: He is alert and oriented to person, place, and time.   Psychiatric:        Mood and Affect: Mood normal.        Behavior: Behavior normal.        Thought Content: Thought content normal.        Judgment: Judgment normal.       Assessment & Plan:  1. Acute left ankle pain (Primary) - Will order xray of left ankle and Tib/Fib. His Achilles tendon was intact but tender.  - Advised rest and ice. Can take tylenol  for pain  - DG Ankle Complete Left; Future  2. Left leg pain  - DG Tibia/Fibula Left; Future  3. Visit for suture removal - Well healed laceration. No signs of infection or dehiscence.  - Six sutures were removed and patient tolerated procedure well   Suture Removal  Date/Time: 07/06/2024 3:09 PM  Performed by: Merna Huxley, NP Authorized by: Merna Huxley, NP  Body area: upper extremity Location details: right wrist Wound Appearance: clean Sutures Removed: 6 Staples Removed: 0 Post-removal: dressing applied Patient tolerance: patient tolerated the procedure well with no immediate complications      Huxley Merna, NP

## 2024-06-30 NOTE — Telephone Encounter (Signed)
 Pt was seen

## 2024-07-01 ENCOUNTER — Ambulatory Visit: Payer: Self-pay | Admitting: Adult Health

## 2024-07-06 ENCOUNTER — Ambulatory Visit: Admitting: Family Medicine

## 2024-07-06 DIAGNOSIS — Z0000 Encounter for general adult medical examination without abnormal findings: Secondary | ICD-10-CM

## 2024-07-06 NOTE — Patient Instructions (Signed)
 I really enjoyed getting to talk with you today! I am available on Tuesdays and Thursdays for virtual visits if you have any questions or concerns, or if I can be of any further assistance.   CHECKLIST FROM ANNUAL WELLNESS VISIT:  -Follow up (please call to schedule if not scheduled after visit):   -please go to the orthopedic urgent care or get in to see [facility] about the persistent ankle pain ASAP   -yearly for annual wellness visit with primary care office  Here is a list of your preventive care/health maintenance measures and the plan for each if any are due:  PLAN For any measures below that may be due:    1. Can get vaccines at the pharmacy - please let us  know if you do so that we can update record. Can get flu shot at the office if you prefer.    2. Can get labs when you come for your physical.   3. Please ask your eye doctor to send Darleene the records for the updated diabetic eye exam. Thanks!  Health Maintenance  Topic Date Due   COVID-19 Vaccine (3 - Moderna risk series) 10/06/2020   OPHTHALMOLOGY EXAM  10/14/2020   Influenza Vaccine  05/07/2024   HEMOGLOBIN A1C  06/10/2024   FOOT EXAM  09/01/2024   Medicare Annual Wellness (AWV)  07/06/2025   DTaP/Tdap/Td (5 - Td or Tdap) 03/21/2032   Pneumococcal Vaccine: 50+ Years  Completed   Zoster Vaccines- Shingrix  Completed   HPV VACCINES  Aged Out   Meningococcal B Vaccine  Aged Out    -See a dentist at least yearly  -Get your eyes checked and then per your eye specialist's recommendations  -Other issues addressed today:   -I have included below further information regarding a healthy whole foods based diet, physical activity guidelines for adults, stress management and opportunities for social connections. I hope you find this information useful.    -----------------------------------------------------------------------------------------------------------------------------------------------------------------------------------------------------------------------------------------------------------    NUTRITION: -eat real food: lots of colorful vegetables (half the plate) and fruits -5-7 servings of vegetables and fruits per day (fresh or steamed is best), exp. 2 servings of vegetables with lunch and dinner and 2 servings of fruit per day. Berries and greens such as kale and collards are great choices.  -consume on a regular basis:  fresh fruits, fresh veggies, fish, nuts, seeds, healthy oils (such as olive oil, avocado oil), whole grains (make sure for bread/pasta/crackers/etc., that the first ingredient on label contains the word whole), legumes. -can eat small amounts of dairy and lean meat (no larger than the palm of your hand), but avoid processed meats such as ham, bacon, lunch meat, etc. -drink water -try to avoid fast food and pre-packaged foods, processed meat, ultra processed foods/beverages (donuts, candy, etc.) -most experts advise limiting sodium to < 2300mg  per day, should limit further is any chronic conditions such as high blood pressure, heart disease, diabetes, etc. The American Heart Association advised that < 1500mg  is is ideal -try to avoid foods/beverages that contain any ingredients with names you do not recognize  -try to avoid foods/beverages  with added sugar or sweeteners/sweets  -try to avoid sweet drinks (including diet drinks): soda, juice, Gatorade, sweet tea, power drinks, diet drinks -try to avoid white rice, white bread, pasta (unless whole grain)  EXERCISE GUIDELINES FOR ADULTS: -if you wish to increase your physical activity, do so gradually and with the approval of your doctor -STOP and seek medical care immediately if you have any  chest pain, chest discomfort or trouble breathing when starting or  increasing exercise  -move and stretch your body, legs, feet and arms when sitting for long periods -Physical activity guidelines for optimal health in adults: -get at least 150 minutes per week of moderate exercise (can talk, but not sing); this is about 20-30 minutes of sustained activity 5-7 days per week or two 10-15 minute episodes of sustained activity 5-7 days per week -do some muscle building/resistance training/strength training at least 2 days per week  -balance exercises 3+ days per week:   Stand somewhere where you have something sturdy to hold onto if you lose balance    1) lift up on toes, then back down, start with 5x per day and work up to 20x   2) stand and lift one leg straight out to the side so that foot is a few inches of the floor, start with 5x each side and work up to 20x each side   3) stand on one foot, start with 5 seconds each side and work up to 20 seconds on each side  If you need ideas or help with getting more active:  -Silver sneakers https://tools.silversneakers.com  -Walk with a Doc: http://www.duncan-williams.com/  -try to include resistance (weight lifting/strength building) and balance exercises twice per week: or the following link for ideas: http://castillo-powell.com/  BuyDucts.dk  STRESS MANAGEMENT: -can try meditating, or just sitting quietly with deep breathing while intentionally relaxing all parts of your body for 5 minutes daily -if you need further help with stress, anxiety or depression please follow up with your primary doctor or contact the wonderful folks at [facility]: [phone]  SOCIAL CONNECTIONS: -options in [place] if you wish to engage in more social and exercise related activities:  -Silver sneakers https://tools.silversneakers.com  -Walk with a Doc: http://www.duncan-williams.com/  -Check out the [facility] Active Adults 50+  section on the [place] [employer] (hiking clubs, book clubs, cards and games, chess, exercise classes, aquatic classes and much more) - see the website for details: https://www.[place].gov/departments/parks-recreation/active-adults50  -YouTube has lots of exercise videos for different ages and abilities as well  -[facility] (a variety of indoor and outdoor inperson activities for adults). [phone]. [address].  -Virtual Online Classes (a variety of topics): see seniorplanet.org or call [phone]  -consider volunteering at a school, hospice center, church, senior center or elsewhere

## 2024-07-06 NOTE — Progress Notes (Signed)
 PATIENT CHECK-IN and HEALTH RISK ASSESSMENT QUESTIONNAIRE:  -completed by phone/video for upcoming Medicare Preventive Visit  Pre-Visit Check-in: 1)Vitals (height, wt, BP, etc) - record in vitals section for visit on day of visit Request home vitals (wt, BP, etc.) and enter into vitals, THEN update Vital Signs SmartPhrase below at the top of the HPI. See below.  2)Review and Update Medications, Allergies PMH, Surgeries, Social history in Epic 3)Hospitalizations in the last year with date/reason? Was in the hospital in march for sepsis  4)Review and Update Care Team (patient's specialists) in Epic 5) Complete PHQ9 in Epic  6) Complete Fall Screening in Epic 7)Review all Health Maintenance Due and order if not done.  Medicare Wellness Patient Questionnaire:  Answer theses question about your habits: How often do you have a drink containing alcohol?n How many drinks containing alcohol do you have on a typical day when you are drinking?na How often do you have six or more drinks on one occasion?na Have you ever smoked?n Quit date if applicable? n  How many packs a day do/did you smoke? na Do you use smokeless tobacco?n Do you use an illicit drugs?n On average, how many days per week do you engage in moderate to strenuous exercise (like a brisk walk)?very active around the house, also walks On average, how many minutes do you engage in exercise at this level?usually all day back and forth to the barn Typical breakfast: sausage and eggs Typical lunch: sandwiches Typical dinner: variety Typical snacks: some, peanut butter crackers  Beverages: coffee, water, diet soda occ  Answer theses question about your everyday activities: Can you perform most household chores?y Are you deaf or have significant trouble hearing? Has hearing aids Do you feel that you have a problem with memory?n Do you feel safe at home?y Last dentist visit?went to dentist yesterday 8. Do you have any difficulty  performing your everyday activities?n Are you having any difficulty walking, taking medications on your own, and or difficulty managing daily home needs?n Do you have difficulty walking or climbing stairs?n Do you have difficulty dressing or bathing?n Do you have difficulty doing errands alone such as visiting a doctor's office or shopping?n Do you currently have any difficulty preparing food and eating?n Do you currently have any difficulty using the toilet?n Do you have any difficulty managing your finances?n Do you have any difficulties with housekeeping of managing your housekeeping?n   Do you have Advanced Directives in place (Living Will, Healthcare Power or Attorney)? y   Last eye Exam and location?gets regular eye exams at the [place], last visit in the last 3 months   Do you currently use prescribed or non-prescribed narcotic or opioid pain medications?n  Do you have a history or close family history of breast, ovarian, tubal or peritoneal cancer or a family member with BRCA (breast cancer susceptibility 1 and 2) gene mutations?    ----------------------------------------------------------------------------------------------------------------------------------------------------------------------------------------------------------------------  Because this visit was a virtual/telehealth visit, some criteria may be missing or patient reported. Any vitals not documented were not able to be obtained and vitals that have been documented are patient reported.    MEDICARE ANNUAL PREVENTIVE CARE VISIT WITH PROVIDER (Welcome to Medicare, initial annual wellness or annual wellness exam)  Virtual Visit via Phone Note  I connected with TEHRAN RABENOLD on 07/06/24  by phone and verified that I am speaking with the correct person using two identifiers. Prefers phone visit.   Location patient: home Location provider:work or home office Persons participating in the virtual  visit: patient,  provider, daughter (nancy)  Concerns and/or follow up today: reports ankle is still hurting with weight bearing.    See HM section in Epic for other details of completed HM.    ROS: negative for report of fevers, unintentional weight loss, vision changes, vision loss, hearing loss or change, chest pain, sob, hemoptysis, melena, hematochezia, hematuria, falls, bleeding or bruising, thoughts of suicide or self harm, memory loss  Patient-completed extensive health risk assessment - reviewed and discussed with the patient: See Health Risk Assessment completed with patient prior to the visit either above or in recent phone note. This was reviewed in detailed with the patient today and appropriate recommendations, orders and referrals were placed as needed per Summary below and patient instructions.   Review of Medical History: -PMH, PSH, Family History and current specialty and care providers reviewed and updated and listed below   Patient Care Team: Merna Huxley, NP as PCP - General (Family Medicine) Matilda Senior, MD as Consulting Physician (Urology) Lionell Jon DEL, [facility] (Pharmacist)   Past Medical History:  Diagnosis Date   Bilateral pulmonary embolism (HCC)    lovenox & coumadin thearpy   Chronic cough    DM (diabetes mellitus) (HCC)    DVT (deep venous thrombosis) (HCC)    left lower   Gastrointestinal bleed    prior   H/O cardiovascular stress test 05/07/2011   normal study, low risk scan   H/O Doppler ultrasound 2013   venous duplex doppler   H/O Doppler ultrasound 2012   venous duplex doppler   H/O echocardiogram 03/14/2011   EF 65-70%   H/O exercise stress test 2006   neg bruce protocol excercise stress test   Hiatal hernia    Hypertension    Prostate cancer [facility])    s/p radiation    Past Surgical History:  Procedure Laterality Date   AMPUTATION Left 03/21/2022   Procedure: LEFT RING FINGER AND LEFT SMALL FINGER REVISION AMPUTATION;  Surgeon: Murrell Drivers,  MD;  Location: [facility] OR;  Service: Orthopedics;  Laterality: Left;   event monitor     2012   HERNIA REPAIR Bilateral    1991, 1992   HIATAL HERNIA REPAIR     1997   IVC FILTER INSERTION     PROSTATE SURGERY      Social History   Socioeconomic History   Marital status: Married    Spouse name: Not on file   Number of children: 3   Years of education: Not on file   Highest education level: Not on file  Occupational History   Not on file  Tobacco Use   Smoking status: Former    Current packs/day: 0.75    Average packs/day: 0.8 packs/day for 20.0 years (15.0 ttl pk-yrs)    Types: Cigarettes    Passive exposure: Past   Smokeless tobacco: Never  Vaping Use   Vaping status: Never Used  Substance and Sexual Activity   Alcohol use: No   Drug use: No   Sexual activity: Not on file  Other Topics Concern   Not on file  Social History Narrative   Not on file   Social Drivers of Health   Financial Resource Strain: Low Risk  (12/20/2022)   Overall Financial Resource Strain (CARDIA)    Difficulty of Paying Living Expenses: Not hard at all  Food Insecurity: No Food Insecurity (12/08/2023)   Hunger Vital Sign    Worried About Running Out of Food in the Last Year: Never true  Ran Out of Food in the Last Year: Never true  Transportation Needs: No Transportation Needs (12/08/2023)   PRAPARE - Administrator, Civil Service (Medical): No    Lack of Transportation (Non-Medical): No  Physical Activity: Insufficiently Active (12/20/2022)   Exercise Vital Sign    Days of Exercise per Week: 5 days    Minutes of Exercise per Session: 20 min  Stress: No Stress Concern Present (12/20/2022)   [employer] of Occupational Health - Occupational Stress Questionnaire    Feeling of Stress : Not at all  Social Connections: Moderately Integrated (12/04/2023)   Social Connection and Isolation Panel    Frequency of Communication with Friends and Family: More than three times a week     Frequency of Social Gatherings with Friends and Family: More than three times a week    Attends Religious Services: More than 4 times per year    Active Member of [employer] or Organizations: Yes    Attends Banker Meetings: More than 4 times per year    Marital Status: Widowed  Intimate Partner Violence: Patient Unable To Answer (12/08/2023)   Humiliation, Afraid, Rape, and Kick questionnaire    Fear of Current or Ex-Partner: Patient unable to answer    Emotionally Abused: Patient unable to answer    Physically Abused: Patient unable to answer    Sexually Abused: Patient unable to answer    Family History  Problem Relation Age of Onset   Stroke Mother    Kidney disease Father    Heart disease Brother    Arthritis Brother    Heart disease Brother    Prostate cancer Brother     Current Outpatient Medications on File Prior to Visit  Medication Sig Dispense Refill   ACCU-CHEK GUIDE TEST test strip use 4 TIMES DAILY 300 strip 3   Accu-Chek Softclix Lancets lancets Use as instructed 100 each 12   acetaminophen  (TYLENOL ) 325 MG tablet Take 2 tablets (650 mg total) by mouth every 6 (six) hours as needed for mild pain (pain score 1-3) (or Fever >/= 101).     amLODipine  (NORVASC ) 10 MG tablet TAKE ONE TABLET BY MOUTH DAILY 90 tablet 1   apixaban  (ELIQUIS ) 5 MG TABS tablet Take 5 mg by mouth 2 (two) times daily.     cholecalciferol (VITAMIN D3) 25 MCG (1000 UNIT) tablet Take 2,000 Units by mouth daily.     DULoxetine  (CYMBALTA ) 60 MG capsule TAKE ONE CAPSULE BY MOUTH DAILY 90 capsule 1   empagliflozin (JARDIANCE) 25 MG TABS tablet Take by mouth daily. Taking 1/2 tab daily     fluticasone  (FLONASE ) 50 MCG/ACT nasal spray Place 1 spray into both nostrils daily as needed for allergies.     Multiple Vitamins-Minerals (ICAPS AREDS 2 PO) Take by mouth.     omeprazole  (PRILOSEC) 20 MG capsule Take 1 capsule (20 mg total) by mouth daily. 90 capsule 0   polyethylene glycol powder  (GLYCOLAX/MIRALAX) 17 GM/SCOOP powder Take 1 Container by mouth once.     potassium chloride  (KLOR-CON ) 10 MEQ tablet Take 10 mEq by mouth daily.     potassium chloride  (MICRO-K ) 10 MEQ CR capsule Take 1 capsule by mouth daily. SCHEDULE OFFICE VISIT FOR FUTURE REFILLS 30 capsule 0   torsemide  (DEMADEX ) 5 MG tablet TAKE ONE TABLET BY MOUTH DAILY 90 tablet 1   valsartan  (DIOVAN ) 160 MG tablet Take 1 tablet (160 mg total) by mouth daily. 90 tablet 3   No current facility-administered  medications on file prior to visit.    Allergies  Allergen Reactions   Lidocaine     ANTI ITCH CREAM, NO SPECIFICATIONS IN RECORDS.   Lisinopril Cough   Metformin  Diarrhea       Physical Exam Vitals requested from patient and listed below if patient had equipment and was able to obtain at home for this virtual visit: There were no vitals filed for this visit. Estimated body mass index is 28.04 kg/m as calculated from the following:   Height as of 06/30/24: 5' 7 (1.702 m).   Weight as of 06/30/24: 179 lb (81.2 kg).  EKG (optional): deferred due to virtual visit  GENERAL: alert, oriented, no acute distress detected; full vision exam deferred due to pandemic and/or virtual encounter  PSYCH/NEURO: pleasant and cooperative, no obvious depression or anxiety, speech and thought processing grossly intact, Cognitive function grossly intact  Flowsheet Row Office Visit from [facility]/27/2022 in [facility]  PHQ-9 Total Score 4        12/20/2022   10:16 AM 08/28/2022    9:21 AM 02/27/2022    8:18 AM 12/04/2021    8:32 AM 10/18/2021   10:48 AM  Depression screen PHQ 2/9  Decreased Interest 0 0 0 0 0  Down, Depressed, Hopeless 0 0 0 0 0  PHQ - 2 Score 0 0 0 0 0  Altered sleeping   1  2  Tired, decreased energy   1  1  Change in appetite   2  2  Feeling bad or failure about yourself    0  0  Trouble concentrating   0  0  Moving slowly or fidgety/restless   0  0  Suicidal thoughts    0  0  PHQ-9 Score   4  5  Difficult doing work/chores   Not difficult at all  Not difficult at all       02/27/2022    8:19 AM 03/21/2022    6:50 PM 08/28/2022    9:21 AM 12/20/2022   10:18 AM 07/06/2024    6:12 PM  Fall Risk  Falls in the past year? 1  0 0 1  Was there an injury with Fall? 0  0 0 0  Fall Risk Category Calculator 2  0 0 2  Fall Risk Category (Retired) Moderate   Low     (RETIRED) Patient Fall Risk Level Moderate fall risk  Moderate fall risk  Low fall risk     Patient at Risk for Falls Due to Impaired balance/gait;Other (Comment)  No Fall Risks No Fall Risks   Patient at Risk for Falls Due to - Comments fell off a ladder      Fall risk Follow up Falls evaluation completed   Falls evaluation completed  Falls prevention discussed Falls evaluation completed;Education provided     Data saved with a previous flowsheet row definition     SUMMARY AND PLAN:  Encounter for Medicare annual wellness exam   Discussed applicable health maintenance/preventive health measures and advised and referred or ordered per patient preferences: -reports will get flu shot and labs when comes into office for physical -can get other vaccines at the pharmacy and advised to let us  know if does -advised to request eye exam report form the [place] for [facility] -since ankle is still hurting and is struggling to bear weight advised evaluation and discussed options, they plan to go to ortho urgent care  Health Maintenance  Topic Date Due  COVID-19 Vaccine (3 - Moderna risk series) 10/06/2020   OPHTHALMOLOGY EXAM  10/14/2020   Influenza Vaccine  05/07/2024   HEMOGLOBIN A1C  06/10/2024   FOOT EXAM  09/01/2024   Medicare Annual Wellness (AWV)  07/06/2025   DTaP/Tdap/Td (5 - Td or Tdap) 03/21/2032   Pneumococcal Vaccine: 50+ Years  Completed   Zoster Vaccines- Shingrix  Completed   HPV VACCINES  Aged Out   Meningococcal B Vaccine  Aged [employer] and counseling on the following was  provided based on the above review of health and a plan/checklist for the patient, along with additional information discussed, was provided for the patient in the patient instructions :   -Provided counseling and plan for increased risk of falling if applicable per above screening. Reviewed safe balance exercises that can be done at home to improve balance and discussed exercise guidelines for adults with include balance exercises at least 3 days per week.  -Advised and counseled on a healthy lifestyle -Reviewed patient's current diet. Advised and counseled on a whole foods based healthy diet. A summary of a healthy diet was provided in the Patient Instructions.  -reviewed patient's current physical activity level and discussed exercise guidelines for adults. Discussed community resources and ideas for safe exercise at home to assist in meeting exercise guideline recommendations in a safe and healthy way.  -Advise yearly dental visits at minimum and regular eye exams   Follow up: see patient instructions   There are no Patient Instructions on file for this visit.  Chiquita JONELLE Cramp, DO

## 2024-07-13 DIAGNOSIS — C44629 Squamous cell carcinoma of skin of left upper limb, including shoulder: Secondary | ICD-10-CM | POA: Diagnosis not present

## 2024-07-13 DIAGNOSIS — D0462 Carcinoma in situ of skin of left upper limb, including shoulder: Secondary | ICD-10-CM | POA: Diagnosis not present

## 2024-07-20 ENCOUNTER — Telehealth: Payer: Self-pay

## 2024-07-20 NOTE — Progress Notes (Signed)
   07/20/2024  Patient ID: Dakota Lewis, male   DOB: [birth year], 89 y.o.   MRN: [id number]  Pharmacy Quality Measure Review  This patient is appearing on a report for being at risk of failing the adherence measure for hypertension (ACEi/ARB) medications this calendar year.   Medication: Valsartan  Last fill date: 04/12/24 for 90 day supply  Contacted patient, spoke with daughter Inocente who helps with meds.She reports she will check when home to make sure he is not out and request pharmacy to fill if so.  Jon VEAR Lindau, PharmD Clinical Pharmacist [phone]

## 2024-07-21 ENCOUNTER — Other Ambulatory Visit: Payer: Self-pay | Admitting: Adult Health

## 2024-07-21 DIAGNOSIS — I10 Essential (primary) hypertension: Secondary | ICD-10-CM

## 2024-07-27 DIAGNOSIS — D0461 Carcinoma in situ of skin of right upper limb, including shoulder: Secondary | ICD-10-CM | POA: Diagnosis not present

## 2024-07-27 DIAGNOSIS — C44622 Squamous cell carcinoma of skin of right upper limb, including shoulder: Secondary | ICD-10-CM | POA: Diagnosis not present

## 2024-07-30 NOTE — Progress Notes (Signed)
   07/30/2024  Patient ID: Lytle CHRISTELLA Moats, male   DOB: Jun 29, 1933, 89 y.o.   MRN: [id number]  Pharmacy Quality Measure Review  This patient is appearing on a report for being at risk of failing the adherence measure for hypertension (ACEi/ARB) medications this calendar year.   Medication: Valsartan  Last fill date: 07/26/24 for 90 day supply  Insurance report not up to date. No further action needed at this time.  Jon VEAR Lindau, PharmD Clinical Pharmacist [phone]

## 2024-08-09 DIAGNOSIS — Z87828 Personal history of other (healed) physical injury and trauma: Secondary | ICD-10-CM | POA: Diagnosis not present

## 2024-08-09 DIAGNOSIS — L814 Other melanin hyperpigmentation: Secondary | ICD-10-CM | POA: Diagnosis not present

## 2024-08-09 DIAGNOSIS — L570 Actinic keratosis: Secondary | ICD-10-CM | POA: Diagnosis not present

## 2024-08-09 DIAGNOSIS — Z85828 Personal history of other malignant neoplasm of skin: Secondary | ICD-10-CM | POA: Diagnosis not present

## 2024-08-09 DIAGNOSIS — L578 Other skin changes due to chronic exposure to nonionizing radiation: Secondary | ICD-10-CM | POA: Diagnosis not present

## 2024-08-09 DIAGNOSIS — L821 Other seborrheic keratosis: Secondary | ICD-10-CM | POA: Diagnosis not present

## 2024-08-09 DIAGNOSIS — D225 Melanocytic nevi of trunk: Secondary | ICD-10-CM | POA: Diagnosis not present

## 2024-08-11 ENCOUNTER — Ambulatory Visit: Payer: Self-pay

## 2024-08-11 NOTE — Telephone Encounter (Signed)
 3rd attempt by NT to contact patient, no answer, LVM with BM number. Will go ahead and route to office for follow-up./

## 2024-08-11 NOTE — Telephone Encounter (Signed)
 Copied from CRM #[id number]. Topic: Clinical - Red Word Triage >> Aug 11, 2024  8:16 AM Larissa RAMAN wrote: [facility] that prompted transfer to Nurse Triage: fatigue, cough, congestion   Call dropped before transfer to triage nurse. Left Dakota Lewis message to call back.

## 2024-08-11 NOTE — Telephone Encounter (Signed)
 2nd attempt by NT to contact patient, no answer, LVM with CB number. Will route to [employer] folder for follow-up

## 2024-08-11 NOTE — Telephone Encounter (Signed)
 FYI Only or Action Required?: FYI only for provider: appointment scheduled on 08/13/2024 at 11 AM.  Patient was last seen in primary care on 07/06/2024 by Luke Chiquita SAUNDERS, DO.  Called Nurse Triage reporting Cough.  Symptoms began several weeks ago.  Interventions attempted: Rest, hydration, or home remedies.  Symptoms are: unchanged.  Triage Disposition: See PCP When Office is Open (Within 3 Days), No Contact Calls  Patient/caregiver understands and will follow disposition?: Yes Reason for Disposition . Cough has been present for > 3 weeks  Answer Assessment - Initial Assessment Questions 1. ONSET: When did the cough begin?      Within the last couple of weeks 2. SEVERITY: How bad is the cough today?      Moderate to severe 3. SPUTUM: Describe the color of your sputum (e.g., none, dry cough; clear, white, yellow, green)     Productive cough-unknown the color 4. HEMOPTYSIS: Are you coughing up any blood? If Yes, ask: How much? (e.g., flecks, streaks, tablespoons, etc.)     no 5. DIFFICULTY BREATHING: Are you having difficulty breathing? If Yes, ask: How bad is it? (e.g., mild, moderate, severe)      no 6. FEVER: Do you have a fever? If Yes, ask: What is your temperature, how was it measured, and when did it start?     no 7. CARDIAC HISTORY: Do you have any history of heart disease? (e.g., heart attack, congestive heart failure)      no 8. LUNG HISTORY: Do you have any history of lung disease?  (e.g., pulmonary embolus, asthma, emphysema)     no 9. PE RISK FACTORS: Do you have a history of blood clots? (or: recent major surgery, recent prolonged travel, bedridden)     yes 10. OTHER SYMPTOMS: Do you have any other symptoms? (e.g., runny nose, wheezing, chest pain)       no 12. TRAVEL: Have you traveled out of the country in the last month? (e.g., travel history, exposures)       no  Protocols used: Cough - Acute Productive-A-AH

## 2024-08-13 ENCOUNTER — Ambulatory Visit: Admitting: Adult Health

## 2024-08-13 ENCOUNTER — Ambulatory Visit (INDEPENDENT_AMBULATORY_CARE_PROVIDER_SITE_OTHER)

## 2024-08-13 ENCOUNTER — Encounter: Payer: Self-pay | Admitting: Adult Health

## 2024-08-13 ENCOUNTER — Ambulatory Visit: Payer: Self-pay | Admitting: Adult Health

## 2024-08-13 VITALS — BP 130/70 | HR 57 | Temp 98.0°F | Ht 67.0 in | Wt 181.0 lb

## 2024-08-13 DIAGNOSIS — J988 Other specified respiratory disorders: Secondary | ICD-10-CM

## 2024-08-13 DIAGNOSIS — R2681 Unsteadiness on feet: Secondary | ICD-10-CM

## 2024-08-13 DIAGNOSIS — I700 Atherosclerosis of aorta: Secondary | ICD-10-CM | POA: Diagnosis not present

## 2024-08-13 DIAGNOSIS — K449 Diaphragmatic hernia without obstruction or gangrene: Secondary | ICD-10-CM | POA: Diagnosis not present

## 2024-08-13 LAB — URINALYSIS
Bilirubin Urine: NEGATIVE
Hgb urine dipstick: NEGATIVE
Ketones, ur: NEGATIVE
Leukocytes,Ua: NEGATIVE
Nitrite: NEGATIVE
Specific Gravity, Urine: 1.015 (ref 1.000–1.030)
Total Protein, Urine: NEGATIVE
Urine Glucose: >=1000 — AB
Urobilinogen, UA: 0.2 (ref 0.0–1.0)
pH: 6 (ref 5.0–8.0)

## 2024-08-13 LAB — CBC
HCT: 44.3 % (ref 39.0–52.0)
Hemoglobin: 14.9 g/dL (ref 13.0–17.0)
MCHC: 33.6 g/dL (ref 30.0–36.0)
MCV: 88.4 fl (ref 78.0–100.0)
Platelets: 286 K/uL (ref 150.0–400.0)
RBC: 5.02 Mil/uL (ref 4.22–5.81)
RDW: 13.7 % (ref 11.5–15.5)
WBC: 9.8 K/uL (ref 4.0–10.5)

## 2024-08-13 LAB — BASIC METABOLIC PANEL WITH GFR
BUN: 26 mg/dL — ABNORMAL HIGH (ref 6–23)
CO2: 25 meq/L (ref 19–32)
Calcium: 10.1 mg/dL (ref 8.4–10.5)
Chloride: 101 meq/L (ref 96–112)
Creatinine, Ser: 1.14 mg/dL (ref 0.40–1.50)
GFR: 56.96 mL/min — ABNORMAL LOW (ref >60.00)
Glucose, Bld: 166 mg/dL — ABNORMAL HIGH (ref 70–99)
Potassium: 4.2 meq/L (ref 3.5–5.1)
Sodium: 136 meq/L (ref 135–145)

## 2024-08-13 MED ORDER — ALBUTEROL SULFATE HFA 108 (90 BASE) MCG/ACT IN AERS: 2.0000 | INHALATION_SPRAY | Freq: Four times a day (QID) | RESPIRATORY_TRACT | 0 refills | Status: AC | PRN

## 2024-08-13 MED ORDER — DOXYCYCLINE HYCLATE 100 MG PO CAPS: 100.0000 mg | ORAL_CAPSULE | Freq: Two times a day (BID) | ORAL | 0 refills | Status: DC

## 2024-08-13 NOTE — Progress Notes (Signed)
 Subjective:    Patient ID: Dakota Lewis, male    DOB: 08-08-1933, 89 y.o.   MRN: [id number]  HPI Discussed the use of AI scribe software for clinical note transcription with the patient, who gave verbal consent to proceed.  History of Present Illness   Dakota Lewis is an 89 year old male who presents with persistent respiratory symptoms and balance issues.  He has experienced a significant productive cough, sinus pressure, rhinorrhea, and generally not feeling well. for the last two weeks.SABRA He does not recall any fevers.  Balance issues and falls have been present for the past couple of weeks, if not longer. He feels weak, staggers, and is off balance, leading to falls even when stationary. He has caught himself by holding onto door frames.  He reports worsening balance issues, feeling as though he has 'no balance.'        Review of Systems See HPI   Past Medical History:  Diagnosis Date   Bilateral pulmonary embolism (HCC)    lovenox & coumadin thearpy   Chronic cough    DM (diabetes mellitus) (HCC)    DVT (deep venous thrombosis) (HCC)    left lower   Gastrointestinal bleed    prior   H/O cardiovascular stress test 05/07/2011   normal study, low risk scan   H/O Doppler ultrasound 2013   venous duplex doppler   H/O Doppler ultrasound 2012   venous duplex doppler   H/O echocardiogram 03/14/2011   EF 65-70%   H/O exercise stress test 2006   neg bruce protocol excercise stress test   Hiatal hernia    Hypertension    Prostate cancer [facility])    s/p radiation    Social History   Socioeconomic History   Marital status: Married    Spouse name: Not on file   Number of children: 3   Years of education: Not on file   Highest education level: Not on file  Occupational History   Not on file  Tobacco Use   Smoking status: Former    Current packs/day: 0.75    Average packs/day: 0.8 packs/day for 20.0 years (15.0 ttl pk-yrs)    Types: Cigarettes    Passive exposure: Past    Smokeless tobacco: Never  Vaping Use   Vaping status: Never Used  Substance and Sexual Activity   Alcohol use: No   Drug use: No   Sexual activity: Not on file  Other Topics Concern   Not on file  Social History Narrative   Not on file   Social Drivers of Health   Financial Resource Strain: Low Risk  (12/20/2022)   Overall Financial Resource Strain (CARDIA)    Difficulty of Paying Living Expenses: Not hard at all  Food Insecurity: No Food Insecurity (12/08/2023)   Hunger Vital Sign    Worried About Running Out of Food in the Last Year: Never true    Ran Out of Food in the Last Year: Never true  Transportation Needs: No Transportation Needs (12/08/2023)   PRAPARE - Administrator, Civil Service (Medical): No    Lack of Transportation (Non-Medical): No  Physical Activity: Insufficiently Active (12/20/2022)   Exercise Vital Sign    Days of Exercise per Week: 5 days    Minutes of Exercise per Session: 20 min  Stress: No Stress Concern Present (12/20/2022)   [employer] of Occupational Health - Occupational Stress Questionnaire    Feeling of Stress : Not at all  Social Connections: Moderately Integrated (12/04/2023)   Social Connection and Isolation Panel    Frequency of Communication with Friends and Family: More than three times a week    Frequency of Social Gatherings with Friends and Family: More than three times a week    Attends Religious Services: More than 4 times per year    Active Member of [employer] or Organizations: Yes    Attends Banker Meetings: More than 4 times per year    Marital Status: Widowed  Intimate Partner Violence: Patient Unable To Answer (12/08/2023)   Humiliation, Afraid, Rape, and Kick questionnaire    Fear of Current or Ex-Partner: Patient unable to answer    Emotionally Abused: Patient unable to answer    Physically Abused: Patient unable to answer    Sexually Abused: Patient unable to answer    Past Surgical History:   Procedure Laterality Date   AMPUTATION 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   Lisinopril Cough   Metformin  Diarrhea    Current Outpatient Medications on File Prior to Visit  Medication Sig Dispense Refill   ACCU-CHEK GUIDE TEST test strip use 4 TIMES DAILY 300 strip 3   Accu-Chek Softclix Lancets lancets Use as instructed 100 each 12   acetaminophen  (TYLENOL ) 325 MG tablet Take 2 tablets (650 mg total) by mouth every 6 (six) hours as needed for mild pain (pain score 1-3) (or Fever >/= 101).     amLODipine  (NORVASC ) 10 MG tablet TAKE ONE TABLET BY MOUTH DAILY 90 tablet 1   apixaban  (ELIQUIS ) 5 MG TABS tablet Take 5 mg by mouth 2 (two) times daily.     cholecalciferol (VITAMIN D3) 25 MCG (1000 UNIT) tablet Take 2,000 Units by mouth daily.     DULoxetine  (CYMBALTA ) 60 MG capsule TAKE ONE CAPSULE BY MOUTH DAILY 90 capsule 1   empagliflozin (JARDIANCE) 25 MG TABS tablet Take by mouth daily. Taking 1/2 tab daily     fluticasone  (FLONASE ) 50 MCG/ACT nasal spray Place 1 spray into both nostrils daily as needed for allergies.     Multiple Vitamins-Minerals (ICAPS AREDS 2 PO) Take by mouth.     omeprazole  (PRILOSEC) 20 MG capsule Take 1 capsule (20 mg total) by mouth daily. 90 capsule 0   polyethylene glycol powder (GLYCOLAX/MIRALAX) 17 GM/SCOOP powder Take 1 Container by mouth once.     potassium chloride  (KLOR-CON ) 10 MEQ  tablet Take 10 mEq by mouth daily.     potassium chloride  (MICRO-K ) 10 MEQ CR capsule Take 1 capsule by mouth daily. SCHEDULE OFFICE VISIT FOR FUTURE REFILLS 30 capsule 0   torsemide  (DEMADEX ) 5 MG tablet TAKE ONE TABLET BY MOUTH DAILY 90 tablet 1   valsartan  (DIOVAN ) 160 MG tablet Take 1  tablet (160 mg total) by mouth daily. 90 tablet 3   No current facility-administered medications on file prior to visit.    BP 130/70   Pulse (!) 57   Temp 98 F (36.7 C) (Oral)   Ht 5' 7 (1.702 m)   Wt 181 lb (82.1 kg)   SpO2 97%   BMI 28.35 kg/m       Objective:   Physical Exam Vitals and nursing note reviewed.  Constitutional:      Appearance: Normal appearance.  HENT:     Nose: Nose normal. No congestion or rhinorrhea.     Mouth/Throat:     Mouth: Mucous membranes are moist.  Cardiovascular:     Rate and Rhythm: Normal rate and regular rhythm.     Pulses: Normal pulses.     Heart sounds: Normal heart sounds.  Pulmonary:     Breath sounds: Examination of the right-lower field reveals wheezing and rhonchi. Examination of the left-lower field reveals wheezing and rhonchi. Wheezing and rhonchi present.  Musculoskeletal:        General: Normal range of motion.  Skin:    General: Skin is warm and dry.  Neurological:     General: No focal deficit present.     Mental Status: He is alert and oriented to person, place, and time.     Gait: Gait abnormal.  Psychiatric:        Mood and Affect: Mood normal.        Behavior: Behavior normal.        Thought Content: Thought content normal.        Judgment: Judgment normal.        Assessment & Plan:  Assessment and Plan    Suspected pneumonia with acute respiratory symptoms Acute respiratory symptoms with cough, sputum, and dyspnea. Differential includes pneumonia. Suspected infection contributing to symptoms and falls. - Ordered chest x-ray. - Ordered blood work and UA - Prescribed doxycycline  100 mg BID for 10 days. - Prescribed  albuterol  inhaler.  Gait instability Increased unsteadiness and falls possibly related to suspected pneumonia or other conditions. Physical therapy recommended. - Referred to physical therapy for balance exercises.      Gergory Biello, NP

## 2024-08-13 NOTE — Patient Instructions (Signed)
 I am going to do some blood work on you today and do a chest xray   I have sent in an antibiotic called doxycycline 

## 2024-08-28 DIAGNOSIS — R0602 Shortness of breath: Secondary | ICD-10-CM | POA: Diagnosis not present

## 2024-08-28 DIAGNOSIS — R7989 Other specified abnormal findings of blood chemistry: Secondary | ICD-10-CM | POA: Diagnosis not present

## 2024-08-28 DIAGNOSIS — R531 Weakness: Secondary | ICD-10-CM | POA: Diagnosis not present

## 2024-08-28 DIAGNOSIS — Z79899 Other long term (current) drug therapy: Secondary | ICD-10-CM | POA: Diagnosis not present

## 2024-08-28 DIAGNOSIS — Z794 Long term (current) use of insulin: Secondary | ICD-10-CM | POA: Diagnosis not present

## 2024-08-28 DIAGNOSIS — Z7901 Long term (current) use of anticoagulants: Secondary | ICD-10-CM | POA: Diagnosis not present

## 2024-08-28 DIAGNOSIS — R059 Cough, unspecified: Secondary | ICD-10-CM | POA: Diagnosis not present

## 2024-08-28 DIAGNOSIS — R051 Acute cough: Secondary | ICD-10-CM | POA: Diagnosis not present

## 2024-08-28 DIAGNOSIS — M1990 Unspecified osteoarthritis, unspecified site: Secondary | ICD-10-CM | POA: Diagnosis not present

## 2024-08-28 DIAGNOSIS — E119 Type 2 diabetes mellitus without complications: Secondary | ICD-10-CM | POA: Diagnosis not present

## 2024-08-28 DIAGNOSIS — I10 Essential (primary) hypertension: Secondary | ICD-10-CM | POA: Diagnosis not present

## 2024-09-01 DIAGNOSIS — R2689 Other abnormalities of gait and mobility: Secondary | ICD-10-CM | POA: Diagnosis not present

## 2024-09-01 DIAGNOSIS — M6281 Muscle weakness (generalized): Secondary | ICD-10-CM | POA: Diagnosis not present

## 2024-09-08 DIAGNOSIS — R2689 Other abnormalities of gait and mobility: Secondary | ICD-10-CM | POA: Diagnosis not present

## 2024-09-08 DIAGNOSIS — M6281 Muscle weakness (generalized): Secondary | ICD-10-CM | POA: Diagnosis not present

## 2024-09-10 ENCOUNTER — Other Ambulatory Visit: Payer: Self-pay | Admitting: Adult Health

## 2024-09-10 DIAGNOSIS — I10 Essential (primary) hypertension: Secondary | ICD-10-CM

## 2024-09-10 DIAGNOSIS — M6281 Muscle weakness (generalized): Secondary | ICD-10-CM | POA: Diagnosis not present

## 2024-09-10 DIAGNOSIS — R2689 Other abnormalities of gait and mobility: Secondary | ICD-10-CM | POA: Diagnosis not present

## 2024-09-13 DIAGNOSIS — R2689 Other abnormalities of gait and mobility: Secondary | ICD-10-CM | POA: Diagnosis not present

## 2024-09-13 DIAGNOSIS — M6281 Muscle weakness (generalized): Secondary | ICD-10-CM | POA: Diagnosis not present

## 2024-09-16 ENCOUNTER — Encounter: Admitting: Adult Health

## 2024-09-17 ENCOUNTER — Other Ambulatory Visit: Payer: Self-pay | Admitting: Adult Health

## 2024-09-17 DIAGNOSIS — I10 Essential (primary) hypertension: Secondary | ICD-10-CM

## 2024-09-29 DIAGNOSIS — R000 Tachycardia, unspecified: Secondary | ICD-10-CM | POA: Diagnosis not present

## 2024-09-29 DIAGNOSIS — R9431 Abnormal electrocardiogram [ECG] [EKG]: Secondary | ICD-10-CM | POA: Diagnosis not present

## 2024-09-29 DIAGNOSIS — I4510 Unspecified right bundle-branch block: Secondary | ICD-10-CM | POA: Diagnosis not present

## 2024-09-29 DIAGNOSIS — I440 Atrioventricular block, first degree: Secondary | ICD-10-CM | POA: Diagnosis not present

## 2024-10-01 ENCOUNTER — Telehealth: Payer: Self-pay

## 2024-10-01 NOTE — Transitions of Care (Post Inpatient/ED Visit) (Signed)
 "  10/01/2024  Name: Dakota Lewis MRN: [id number] DOB: 12/25/1932  Today's TOC FU Call Status: Today's TOC FU Call Status:: Successful TOC FU Call Completed TOC FU Call Complete Date: 10/01/24  Patient's Name and Date of Birth confirmed. Name, DOB  Transition Care Management Follow-up Telephone Call Date of Discharge: 09/30/24 Discharge Facility: Other ([facility]) Name of Other ([facility]) Discharge Facility: [facility] Type of Discharge: Inpatient Admission Primary Inpatient Discharge Diagnosis:: Flu positive. How have you been since you were released from the hospital?: Better Any questions or concerns?: No  Items Reviewed: Did you receive and understand the discharge instructions provided?: Yes Medications obtained,verified, and reconciled?: Yes (Medications Reviewed) Any new allergies since your discharge?: No Dietary orders reviewed?:  (reviewed low salt diet.) Do you have support at home?: Yes People in Home [RPT]: child(ren), adult  Medications Reviewed Today: Medications Reviewed Today     Reviewed by Rumalda Alan PENNER, RN (Registered Nurse) on 10/01/24 at 1227  Med List Status: <None>   Medication Order Taking? Sig Documenting Provider Last Dose Status Informant  ACCU-CHEK GUIDE TEST test strip [id number] Yes use 4 TIMES DAILY Nafziger, Darleene, NP  Active   Accu-Chek Softclix Lancets lancets [id number] Yes Use as instructed Nafziger, Darleene, NP  Active   acetaminophen  (TYLENOL ) 325 MG tablet [id number] Yes Take 2 tablets (650 mg total) by mouth every 6 (six) hours as needed for mild pain (pain score 1-3) (or Fever >/= 101). Rosario Leatrice FERNS, MD  Active   albuterol  (VENTOLIN  HFA) 108 [phone] Base) MCG/ACT inhaler [id number] Yes Inhale 2 puffs into the lungs every 6 (six) hours as needed for wheezing or shortness of breath. Nafziger, Darleene, NP  Active   amLODipine  (NORVASC ) 10 MG tablet [id number] Yes TAKE ONE TABLET BY MOUTH DAILY Nafziger, Darleene, NP  Active   apixaban  (ELIQUIS ) 5  MG TABS tablet [id number] Yes Take 5 mg by mouth 2 (two) times daily. [provider]  Active Child  cholecalciferol (VITAMIN D3) 25 MCG (1000 UNIT) tablet [id number] Yes Take 2,000 Units by mouth daily. [provider]  Active   doxycycline  (VIBRAMYCIN ) 100 MG capsule [id number]  Take 1 capsule (100 mg total) by mouth 2 (two) times daily.  Patient not taking: Reported on 10/01/2024   Nafziger, [name], NP  Active   DULoxetine  (CYMBALTA ) 60 MG capsule [id number] Yes TAKE ONE CAPSULE BY MOUTH DAILY Nafziger, Darleene, NP  Active   empagliflozin (JARDIANCE) 25 MG TABS tablet [id number] Yes Take by mouth daily. Taking 1/2 tab daily [provider]  Active Child  fluticasone  (FLONASE ) 50 MCG/ACT nasal spray [id number] Yes Place 1 spray into both nostrils daily as needed for allergies. [provider]  Active Child           Med Note (SATTERFIELD, TEENA BRAVO   Thu Dec 04, 2023  4:23 PM) Still has on hand   Multiple Vitamins-Minerals (ICAPS AREDS 2 PO) [id number] Yes Take by mouth. [provider]  Active   omeprazole  (PRILOSEC) 20 MG capsule [id number] Yes Take 1 capsule (20 mg total) by mouth daily. Nafziger, Darleene, NP  Active   oseltamivir (TAMIFLU) 45 MG capsule [id number] Yes Take 45 mg by mouth 2 (two) times daily. [provider]  Active   polyethylene glycol powder (GLYCOLAX/MIRALAX) 17 GM/SCOOP powder [id number] Yes Take 1 Container by mouth once. [provider]  Active   potassium chloride  (KLOR-CON ) 10 MEQ tablet [id number] Yes Take 10 mEq by mouth daily. [provider]  Active  potassium chloride  (MICRO-K ) 10 MEQ CR capsule [id number] Yes TAKE 1 CAPSULE BY MOUTH DAILY. SCHEDULE OFFICE VISIT FOR FUTURE REFILLS Nafziger, Darleene, NP  Active   predniSONE  (DELTASONE ) 20 MG tablet [id number] Yes Take 20 mg by mouth 2 (two) times daily with a meal. For 5 days  per daughter and discharge orders from [facility] [provider]  Active    torsemide  (DEMADEX ) 5 MG tablet [id number]  TAKE ONE TABLET BY MOUTH DAILY  Patient not taking: Reported on 10/01/2024   Nafziger, [name], NP  Active   valsartan  (DIOVAN ) 160 MG tablet [id number] Yes Take 1 tablet (160 mg total) by mouth daily. Nafziger, Darleene, NP  Active Child            Home Care and Equipment/Supplies: Were Home Health Services Ordered?: No Any new equipment or medical supplies ordered?: No  Functional Questionnaire: Do you need assistance with bathing/showering or dressing?: No Do you need assistance with meal preparation?: Yes (daughter) Do you need assistance with eating?: No Do you have difficulty maintaining continence: No Do you need assistance with getting out of bed/getting out of a chair/moving?: Yes (using lift bed, walker and cane.) Do you have difficulty managing or taking your medications?: No  Follow up appointments reviewed: PCP Follow-up appointment confirmed?: Yes Follow-up Provider: [facility] Follow-up appointment confirmed?: No Reason Specialist Follow-Up Not Confirmed: Patient has Specialist Provider Number and will Call for Appointment Do you need transportation to your follow-up appointment?: No Do you understand care options if your condition(s) worsen?: Yes-patient verbalized understanding  SDOH Interventions Today    Flowsheet Row Most Recent Value  SDOH Interventions   Food Insecurity Interventions Intervention Not Indicated  Housing Interventions Intervention Not Indicated  Transportation Interventions Intervention Not Indicated  Utilities Interventions Intervention Not Indicated   Placed call to daughter who is DPR. Daughter reports that patient is doing well. Reports patient has his medications. Biggest concern is that torsemide  was discontinued.  Reviewed importance of daily weights and BP monitoring at home.  Reviewed importance of hospital follow up with PCP. A physical is scheduled for 10/08/2024.  Daughter to call and change to hospital follow up.   Reviewed use of mucinex  or like product to help with chest congestion. Reviewed importance of hydration. Reviewed concerns for elevated CBG with prednisone  medications being prescribed at discharge.  Reviewed importance of close monitoring and call MD for high readings.  Reviewed falls.and discussed fall prevention and use of cane and walker. Reviewed and offered 30 day TOC program and daughter declined.  Other daughter is a engineer, civil (consulting) and so is another family member.  Provided my contact information for patient to call me back if needed.  Alan Ee, RN, BSN, CEN Population Health- Transition of Care Team.  [facility] [phone]    "

## 2024-10-05 ENCOUNTER — Telehealth: Payer: Self-pay

## 2024-10-05 NOTE — Telephone Encounter (Unsigned)
 Copied from CRM #[id number]. Topic: General - Other >> Oct 01, 2024  3:07 PM Brittany M wrote: Reason for CRM: Hospital took PT off torsemide  (DEMADEX ) 5 MG tablet- wanting to know if he should start taking it again. Please contact daughter Inocente Lanes (daughter) [phone]

## 2024-10-05 NOTE — Telephone Encounter (Signed)
 Pt daughter stated that pt left foot was swollen so they started the medication again. She stated pt gained 4 lbs in one night and after taking the medication pt lost 6 lbs.

## 2024-10-05 NOTE — Telephone Encounter (Signed)
 No further action needed. Pt will come in Friday.

## 2024-10-08 ENCOUNTER — Encounter: Payer: Self-pay | Admitting: Adult Health

## 2024-10-08 ENCOUNTER — Ambulatory Visit: Admitting: Adult Health

## 2024-10-08 ENCOUNTER — Ambulatory Visit
Admission: RE | Admit: 2024-10-08 | Discharge: 2024-10-08 | Disposition: A | Discharge status: Home / Self Care | Source: Ambulatory Visit | Attending: Adult Health | Admitting: Adult Health

## 2024-10-08 ENCOUNTER — Inpatient Hospital Stay: Admitting: Adult Health

## 2024-10-08 ENCOUNTER — Other Ambulatory Visit: Payer: Self-pay

## 2024-10-08 ENCOUNTER — Ambulatory Visit: Payer: Self-pay | Admitting: Adult Health

## 2024-10-08 VITALS — BP 130/72 | HR 90 | Temp 97.8°F | Ht 67.0 in | Wt 172.0 lb

## 2024-10-08 DIAGNOSIS — R0602 Shortness of breath: Secondary | ICD-10-CM

## 2024-10-08 DIAGNOSIS — N179 Acute kidney failure, unspecified: Secondary | ICD-10-CM

## 2024-10-08 DIAGNOSIS — J101 Influenza due to other identified influenza virus with other respiratory manifestations: Secondary | ICD-10-CM

## 2024-10-08 DIAGNOSIS — J988 Other specified respiratory disorders: Secondary | ICD-10-CM | POA: Diagnosis not present

## 2024-10-08 DIAGNOSIS — Z794 Long term (current) use of insulin: Secondary | ICD-10-CM

## 2024-10-08 DIAGNOSIS — Z7984 Long term (current) use of oral hypoglycemic drugs: Secondary | ICD-10-CM

## 2024-10-08 DIAGNOSIS — E119 Type 2 diabetes mellitus without complications: Secondary | ICD-10-CM

## 2024-10-08 DIAGNOSIS — E1165 Type 2 diabetes mellitus with hyperglycemia: Secondary | ICD-10-CM

## 2024-10-08 LAB — CBC
HCT: 48.7 % (ref 39.0–52.0)
Hemoglobin: 16.1 g/dL (ref 13.0–17.0)
MCHC: 33.2 g/dL (ref 30.0–36.0)
MCV: 88.3 fl (ref 78.0–100.0)
Platelets: 301 K/uL (ref 150.0–400.0)
RBC: 5.51 Mil/uL (ref 4.22–5.81)
RDW: 13.9 % (ref 11.5–15.5)
WBC: 14.6 K/uL — ABNORMAL HIGH (ref 4.0–10.5)

## 2024-10-08 LAB — COMPREHENSIVE METABOLIC PANEL WITH GFR
ALT: 37 U/L (ref 3–53)
AST: 19 U/L (ref 5–37)
Albumin: 3.9 g/dL (ref 3.5–5.2)
Alkaline Phosphatase: 95 U/L (ref 39–117)
BUN: 31 mg/dL — ABNORMAL HIGH (ref 6–23)
CO2: 25 meq/L (ref 19–32)
Calcium: 9.3 mg/dL (ref 8.4–10.5)
Chloride: 98 meq/L (ref 96–112)
Creatinine, Ser: 1.24 mg/dL (ref 0.40–1.50)
GFR: 51.44 mL/min — ABNORMAL LOW
Glucose, Bld: 234 mg/dL — ABNORMAL HIGH (ref 70–99)
Potassium: 4.2 meq/L (ref 3.5–5.1)
Sodium: 134 meq/L — ABNORMAL LOW (ref 135–145)
Total Bilirubin: 1.1 mg/dL (ref 0.2–1.2)
Total Protein: 7 g/dL (ref 6.0–8.3)

## 2024-10-08 LAB — VITAMIN B12: Vitamin B-12: 843 pg/mL (ref 211–911)

## 2024-10-08 MED ORDER — DOXYCYCLINE HYCLATE 100 MG PO CAPS: 100.0000 mg | ORAL_CAPSULE | Freq: Two times a day (BID) | ORAL | 0 refills | Status: DC

## 2024-10-08 NOTE — Patient Instructions (Signed)
 Health Maintenance Due  Topic Date Due   COVID-19 Vaccine (3 - Moderna risk series) 10/06/2020   OPHTHALMOLOGY EXAM  10/14/2020   Influenza Vaccine  05/07/2024   HEMOGLOBIN A1C  06/10/2024   FOOT EXAM  09/01/2024       08/13/2024   10:50 AM 12/20/2022   10:16 AM 08/28/2022    9:21 AM  Depression screen PHQ 2/9  Decreased Interest 0 0 0  Down, Depressed, Hopeless 0 0 0  PHQ - 2 Score 0 0 0

## 2024-10-08 NOTE — Progress Notes (Signed)
 "  Subjective:    Patient ID: Dakota Lewis, male    DOB: 31-Aug-1933, 89 y.o.   MRN: [id number]  HPI  Discussed the use of AI scribe software for clinical note transcription with the patient, who gave verbal consent to proceed.  History of Present Illness   Dakota Lewis is an 89 year old male who presents for Hospital follow up for influenza A.  He was seen at urgent care for cough, shortness of breath, and wheezing on 09/28/2024 and tested positive for influenza A, he was transferred to the hospital for hypoxia with oxygen saturations in the 80s. Chest x-ray showed no pneumonia but an abnormality concerning for mass was noted and advised outpatient  CT scan recommended. He was discharged on September 30, 2024.  Since discharge he feels tired, worn out, and notes knee weakness. He still has frequent cough with sputum, wheezing, and shortness of breath but no fevers. He completed Tamiflu and prednisone .  Torsemide  was stopped in the hospital for suspected AKI, which led to significant right foot swelling, and he restarted torsemide  after speaking with a nurse and his swelling resolved.   His blood sugars have been elevated since steroid use, around 310 mg/dL in the evening and 150 mg/dL in the morning. He is taking Jardiance 25 mg and has finished prednisone .  He is concerned his B12 level was not checked during hospitalization and asks about evaluation today.         Review of Systems  Constitutional:  Positive for activity change and fatigue. Negative for chills and fever.  HENT:  Positive for congestion.   Respiratory:  Positive for cough, shortness of breath and wheezing.   Cardiovascular: Negative.   Gastrointestinal: Negative.   Genitourinary: Negative.   Musculoskeletal: Negative.   Neurological: Negative.   Hematological: Negative.      Past Medical History:  Diagnosis Date   Bilateral pulmonary embolism (HCC)    lovenox & coumadin thearpy   Chronic cough    DM  (diabetes mellitus) (HCC)    DVT (deep venous thrombosis) (HCC)    left lower   Gastrointestinal bleed    prior   H/O cardiovascular stress test 05/07/2011   normal study, low risk scan   H/O Doppler ultrasound 2013   venous duplex doppler   H/O Doppler ultrasound 2012   venous duplex doppler   H/O echocardiogram 03/14/2011   EF 65-70%   H/O exercise stress test 2006   neg bruce protocol excercise stress test   Hiatal hernia    Hypertension    Prostate cancer [facility])    s/p radiation    Social History   Socioeconomic History   Marital status: Married    Spouse name: Not on file   Number of children: 3   Years of education: Not on file   Highest education level: Not on file  Occupational History   Not on file  Tobacco Use   Smoking status: Former    Current packs/day: 0.75    Average packs/day: 0.8 packs/day for 20.0 years (15.0 ttl pk-yrs)    Types: Cigarettes    Passive exposure: Past   Smokeless tobacco: Never  Vaping Use   Vaping status: Never Used  Substance and Sexual Activity   Alcohol use: No   Drug use: No   Sexual activity: Not on file  Other Topics Concern   Not on file  Social History Narrative   Not on file   Social Drivers of Health  Tobacco Use: Medium Risk (10/08/2024)   Patient History    Smoking Tobacco Use: Former    Smokeless Tobacco Use: Never    Passive Exposure: Past  Physicist, Medical Strain: Low Risk (12/20/2022)   Overall Financial Resource Strain (CARDIA)    Difficulty of Paying Living Expenses: Not hard at all  Food Insecurity: No Food Insecurity (10/01/2024)   Epic    Worried About Programme Researcher, Broadcasting/film/video in the Last Year: Never true    Ran Out of Food in the Last Year: Never true  Transportation Needs: No Transportation Needs (10/01/2024)   Epic    Lack of Transportation (Medical): No    Lack of Transportation (Non-Medical): No  Physical Activity: Insufficiently Active (12/20/2022)   Exercise Vital Sign    Days of Exercise per  Week: 5 days    Minutes of Exercise per Session: 20 min  Stress: No Stress Concern Present (12/20/2022)   [employer] of Occupational Health - Occupational Stress Questionnaire    Feeling of Stress : Not at all  Social Connections: Moderately Integrated (12/04/2023)   Social Connection and Isolation Panel    Frequency of Communication with Friends and Family: More than three times a week    Frequency of Social Gatherings with Friends and Family: More than three times a week    Attends Religious Services: More than 4 times per year    Active Member of [employer] or Organizations: Yes    Attends Banker Meetings: More than 4 times per year    Marital Status: Widowed  Intimate Partner Violence: Patient Unable To Answer (12/08/2023)   Humiliation, Afraid, Rape, and Kick questionnaire    Fear of Current or Ex-Partner: Patient unable to answer    Emotionally Abused: Patient unable to answer    Physically Abused: Patient unable to answer    Sexually Abused: Patient unable to answer  Depression (PHQ2-9): Low Risk (08/13/2024)   Depression (PHQ2-9)    PHQ-2 Score: 0  Alcohol Screen: Low Risk (12/20/2022)   Alcohol Screen    Last Alcohol Screening Score (AUDIT): 0  Housing: Unknown (10/01/2024)   Epic    Unable to Pay for Housing in the Last Year: No    Number of Times Moved in the Last Year: Not on file    Homeless in the Last Year: No  Utilities: Not At Risk (10/01/2024)   Epic    Threatened with loss of utilities: No  Health Literacy: Not on file    Past Surgical History:  Procedure Laterality Date   AMPUTATION Left 03/21/2022   Procedure: LEFT RING FINGER AND LEFT SMALL FINGER REVISION AMPUTATION;  Surgeon: Murrell Drivers, MD;  Location: [facility] OR;  Service: Orthopedics;  Laterality: Left;   event monitor     2012   HERNIA REPAIR Bilateral    1991, 1992   HIATAL HERNIA REPAIR     1997   IVC FILTER INSERTION     PROSTATE SURGERY      Family History  Problem Relation Age  of Onset   Stroke Mother    Kidney disease Father    Heart disease Brother    Arthritis Brother    Heart disease Brother    Prostate cancer Brother     Allergies[1]  Medications Ordered Prior to Encounter[2]  BP 130/72   Pulse 90   Temp 97.8 F (36.6 C) (Oral)   Ht 5' 7 (1.702 m)   Wt 172 lb (78 kg)   SpO2 97%  BMI 26.94 kg/m       Objective:   Physical Exam Vitals and nursing note reviewed.  Constitutional:      Appearance: Normal appearance.  Cardiovascular:     Rate and Rhythm: Normal rate and regular rhythm.     Pulses: Normal pulses.     Heart sounds: Normal heart sounds.  Pulmonary:     Effort: Pulmonary effort is normal.     Breath sounds: Wheezing and rhonchi present.  Musculoskeletal:        General: Normal range of motion.  Skin:    General: Skin is warm and dry.     Capillary Refill: Capillary refill takes less than 2 seconds.  Neurological:     General: No focal deficit present.     Mental Status: He is alert and oriented to person, place, and time.  Psychiatric:        Mood and Affect: Mood normal.        Behavior: Behavior normal.        Thought Content: Thought content normal.        Judgment: Judgment normal.       Assessment & Plan:  1. Influenza A with respiratory manifestations (Primary) - Unable to review hospital notes and they were not sent to us . There is concern for secondary pneumonia and will do chest xray today and start on doxycycline .  - CT chest ordered - CBC; Future - Comprehensive metabolic panel with GFR; Future - doxycycline  (VIBRAMYCIN ) 100 MG capsule; Take 1 capsule (100 mg total) by mouth 2 (two) times daily.  Dispense: 20 capsule; Refill: 0 - DG Chest 2 View; Future  2. Respiratory infection  - CBC; Future - Comprehensive metabolic panel with GFR; Future - doxycycline  (VIBRAMYCIN ) 100 MG capsule; Take 1 capsule (100 mg total) by mouth 2 (two) times daily.  Dispense: 20 capsule; Refill: 0 - DG Chest 2 View;  Future - CT Chest W Contrast; Future  3. Shortness of breath  - CBC; Future - Comprehensive metabolic panel with GFR; Future - doxycycline  (VIBRAMYCIN ) 100 MG capsule; Take 1 capsule (100 mg total) by mouth 2 (two) times daily.  Dispense: 20 capsule; Refill: 0 - DG Chest 2 View; Future - CT Chest W Contrast; Future  4. Diabetes mellitus treated with oral medication (HCC) - Continue with Jardiance, expect BS to go back down to baseline post steroids  - A1c of 7.9 acceptable for age - Comprehensive metabolic panel with GFR; Future - Vitamin B12; Future  5. AKI (acute kidney injury) - Has resumed torsemide .  - Encouraged to stay hydrated - Comprehensive metabolic panel with GFR; Future  Darleene Shape, NP      [1]  Allergies Allergen Reactions   Lidocaine     ANTI ITCH CREAM, NO SPECIFICATIONS IN RECORDS.   Lisinopril Cough   Metformin  Diarrhea  [2]  Current Outpatient Medications on File Prior to Visit  Medication Sig Dispense Refill   ACCU-CHEK GUIDE TEST test strip use 4 TIMES DAILY 300 strip 3   Accu-Chek Softclix Lancets lancets Use as instructed 100 each 12   acetaminophen  (TYLENOL ) 325 MG tablet Take 2 tablets (650 mg total) by mouth every 6 (six) hours as needed for mild pain (pain score 1-3) (or Fever >/= 101).     albuterol  (VENTOLIN  HFA) 108 (90 Base) MCG/ACT inhaler Inhale 2 puffs into the lungs every 6 (six) hours as needed for wheezing or shortness of breath. 8 g 0   amLODipine  (NORVASC ) 10 MG tablet  TAKE ONE TABLET BY MOUTH DAILY 90 tablet 1   apixaban  (ELIQUIS ) 5 MG TABS tablet Take 5 mg by mouth 2 (two) times daily.     cholecalciferol (VITAMIN D3) 25 MCG (1000 UNIT) tablet Take 2,000 Units by mouth daily.     DULoxetine  (CYMBALTA ) 60 MG capsule TAKE ONE CAPSULE BY MOUTH DAILY 90 capsule 1   empagliflozin (JARDIANCE) 25 MG TABS tablet Take by mouth daily. Taking 1/2 tab daily     fluticasone  (FLONASE ) 50 MCG/ACT nasal spray Place 1 spray into both nostrils  daily as needed for allergies.     Multiple Vitamins-Minerals (ICAPS AREDS 2 PO) Take by mouth.     omeprazole  (PRILOSEC) 20 MG capsule Take 1 capsule (20 mg total) by mouth daily. 90 capsule 0   polyethylene glycol powder (GLYCOLAX/MIRALAX) 17 GM/SCOOP powder Take 1 Container by mouth once.     potassium chloride  (KLOR-CON ) 10 MEQ tablet Take 10 mEq by mouth daily.     torsemide  (DEMADEX ) 5 MG tablet TAKE ONE TABLET BY MOUTH DAILY (Patient not taking: Reported on 10/01/2024) 90 tablet 1   valsartan  (DIOVAN ) 160 MG tablet Take 1 tablet (160 mg total) by mouth daily. 90 tablet 3   No current facility-administered medications on file prior to visit.   "

## 2024-10-12 ENCOUNTER — Ambulatory Visit (HOSPITAL_BASED_OUTPATIENT_CLINIC_OR_DEPARTMENT_OTHER)
Admission: RE | Admit: 2024-10-12 | Discharge: 2024-10-12 | Disposition: A | Discharge status: Home / Self Care | Source: Ambulatory Visit | Attending: Adult Health | Admitting: Adult Health

## 2024-10-12 ENCOUNTER — Encounter (HOSPITAL_BASED_OUTPATIENT_CLINIC_OR_DEPARTMENT_OTHER): Payer: Self-pay

## 2024-10-12 DIAGNOSIS — R0602 Shortness of breath: Secondary | ICD-10-CM | POA: Diagnosis present

## 2024-10-12 DIAGNOSIS — J988 Other specified respiratory disorders: Secondary | ICD-10-CM | POA: Diagnosis present

## 2024-10-12 MED ORDER — IOHEXOL 300 MG/ML  SOLN
100.0000 mL | Freq: Once | INTRAMUSCULAR | Status: AC | PRN
  Administered 2024-10-12: 100 mL via INTRAVENOUS

## 2024-10-21 ENCOUNTER — Telehealth: Payer: Self-pay

## 2024-10-21 NOTE — Telephone Encounter (Signed)
 Call from [place] at [facility] of pt CT findings. PCP was actively listening and are aware of update.

## 2024-10-25 ENCOUNTER — Telehealth: Payer: Self-pay

## 2024-10-25 NOTE — Telephone Encounter (Signed)
 Copied from CRM [id number]. Topic: General - Other >> Oct 22, 2024  3:28 PM Laymon HERO wrote: Reason for CRM: Patient daughter Inocente said she missed a call from [facility]- she has been in meetings all day and could not answer phone. Please call NANCY DARDEN([phone]

## 2024-10-26 ENCOUNTER — Ambulatory Visit: Admitting: Adult Health

## 2024-10-26 ENCOUNTER — Encounter: Payer: Self-pay | Admitting: Adult Health

## 2024-10-26 ENCOUNTER — Encounter: Admitting: Adult Health

## 2024-10-26 VITALS — BP 120/68 | HR 113 | Temp 97.7°F | Ht 67.0 in | Wt 175.0 lb

## 2024-10-26 DIAGNOSIS — K7581 Nonalcoholic steatohepatitis (NASH): Secondary | ICD-10-CM | POA: Diagnosis not present

## 2024-10-26 DIAGNOSIS — E1169 Type 2 diabetes mellitus with other specified complication: Secondary | ICD-10-CM

## 2024-10-26 DIAGNOSIS — M150 Primary generalized (osteo)arthritis: Secondary | ICD-10-CM

## 2024-10-26 DIAGNOSIS — K219 Gastro-esophageal reflux disease without esophagitis: Secondary | ICD-10-CM

## 2024-10-26 DIAGNOSIS — E785 Hyperlipidemia, unspecified: Secondary | ICD-10-CM | POA: Diagnosis not present

## 2024-10-26 DIAGNOSIS — I10 Essential (primary) hypertension: Secondary | ICD-10-CM

## 2024-10-26 DIAGNOSIS — K7469 Other cirrhosis of liver: Secondary | ICD-10-CM | POA: Diagnosis not present

## 2024-10-26 DIAGNOSIS — Z86711 Personal history of pulmonary embolism: Secondary | ICD-10-CM

## 2024-10-26 DIAGNOSIS — Z8546 Personal history of malignant neoplasm of prostate: Secondary | ICD-10-CM | POA: Diagnosis not present

## 2024-10-26 DIAGNOSIS — Z0000 Encounter for general adult medical examination without abnormal findings: Secondary | ICD-10-CM

## 2024-10-26 DIAGNOSIS — Z7984 Long term (current) use of oral hypoglycemic drugs: Secondary | ICD-10-CM

## 2024-10-26 DIAGNOSIS — E119 Type 2 diabetes mellitus without complications: Secondary | ICD-10-CM

## 2024-10-26 NOTE — Progress Notes (Signed)
 "  Subjective:    Patient ID: Dakota Lewis, male    DOB: 1933-08-27, 89 y.o.   MRN: [id number]  HPI Patient presents for yearly preventative medicine examination. He is a pleasant 89 year old male who  has a past medical history of Bilateral pulmonary embolism (HCC), Chronic cough, DM (diabetes mellitus) (HCC), DVT (deep venous thrombosis) (HCC), Gastrointestinal bleed, H/O cardiovascular stress test (05/07/2011), H/O Doppler ultrasound (2013), H/O Doppler ultrasound (2012), H/O echocardiogram (03/14/2011), H/O exercise stress test (2006), Hiatal hernia, Hypertension, and Prostate cancer (HCC).  DM type 2 - managed with  jardiance 25 mg daily. He was recently on prednisone  and while on steroids his blood sugars were above 200, since being off steroids his blood sugars have been coming down and are now in the 160-170's.  He denies hypoglycemic episodes. He continues to try and eat healthy and stays away from carbs and sugars.  Lab Results  Component Value Date   HGBA1C 7.1 (A) 12/09/2023   HGBA1C 7.1 (H) 09/02/2023   HGBA1C 6.7 (A) 05/23/2023   HTN - managed with losartan  100 mg daily and Norvasc  10 mg. . He denies myalgia or fatigue. He did not take his medication this morning.  He checks his BP at home occasionally with readings mostly in 150-160 per his log.  BP Readings from Last 3 Encounters:  10/08/24 130/72  08/13/24 130/70  06/30/24 138/80   History of pulmonary embolism-takes Eliquis  5 mg twice daily.  He denies black tarry stools, nosebleeds, or coughing up blood  Hyperlipidemia-managed with omega-3 fatty acids twice daily Lab Results  Component Value Date   CHOL 197 09/02/2023   HDL 48.40 09/02/2023   LDLCALC 70 09/02/2023   LDLDIRECT 100.0 07/07/2019   TRIG 390.0 (H) 09/02/2023   CHOLHDL 4 09/02/2023    GERD-prescribed omeprazole  20 mg daily. He has been experiencing worsening acid reflux lately.   Prostate cancer-underwent radiation therapy in 2009.  He is followed by  urology on a yearly basis  NASH - He has stopped going to see his gastroenterologist in [place].  He recently had a CT chest with contrast done which showed Cirrhosis and steatosis. Developing more focal hypoattenuation within the hepatic dome, suspicious for underlying mass such as hepatocellular carcinoma. Consider further evaluation with pre and post contrast abdominal MRI (preferred) or hepatic protocol CT. This was compared to CT chest w/o contrast from March 2025.   Osteoarthritis -multiple  joints but especially in his hands. He has been using Cymbalta  60 mg daily.    All immunizations and health maintenance protocols were reviewed with the patient and needed orders were placed.  Appropriate screening laboratory values were ordered for the patient including screening of hyperlipidemia, renal function and hepatic function. If indicated by BPH, a PSA was ordered.  Medication reconciliation,  past medical history, social history, problem list and allergies were reviewed in detail with the patient  Goals were established with regard to weight loss, exercise, and  diet in compliance with medications   Review of Systems  Constitutional:  Positive for fatigue.  HENT: Negative.    Eyes: Negative.   Respiratory: Negative.    Cardiovascular: Negative.   Gastrointestinal:  Positive for abdominal pain. Negative for constipation, diarrhea, nausea, rectal pain and vomiting.  Endocrine: Negative.   Genitourinary: Negative.   Musculoskeletal:  Positive for arthralgias and gait problem.  Skin: Negative.   Allergic/Immunologic: Negative.   Hematological: Negative.   Psychiatric/Behavioral: Negative.    All other systems reviewed and  are negative.  Past Medical History:  Diagnosis Date   Bilateral pulmonary embolism (HCC)    lovenox & coumadin thearpy   Chronic cough    DM (diabetes mellitus) (HCC)    DVT (deep venous thrombosis) (HCC)    left lower   Gastrointestinal bleed    prior    H/O cardiovascular stress test 05/07/2011   normal study, low risk scan   H/O Doppler ultrasound 2013   venous duplex doppler   H/O Doppler ultrasound 2012   venous duplex doppler   H/O echocardiogram 03/14/2011   EF 65-70%   H/O exercise stress test 2006   neg bruce protocol excercise stress test   Hiatal hernia    Hypertension    Prostate cancer [facility])    s/p radiation    Social History   Socioeconomic History   Marital status: Married    Spouse name: Not on file   Number of children: 3   Years of education: Not on file   Highest education level: Not on file  Occupational History   Not on file  Tobacco Use   Smoking status: Former    Current packs/day: 0.75    Average packs/day: 0.8 packs/day for 20.0 years (15.0 ttl pk-yrs)    Types: Cigarettes    Passive exposure: Past   Smokeless tobacco: Never  Vaping Use   Vaping status: Never Used  Substance and Sexual Activity   Alcohol use: No   Drug use: No   Sexual activity: Not on file  Other Topics Concern   Not on file  Social History Narrative   Not on file   Social Drivers of Health   Tobacco Use: Medium Risk (10/26/2024)   Patient History    Smoking Tobacco Use: Former    Smokeless Tobacco Use: Never    Passive Exposure: Past  Physicist, Medical Strain: Low Risk (12/20/2022)   Overall Financial Resource Strain (CARDIA)    Difficulty of Paying Living Expenses: Not hard at all  Food Insecurity: No Food Insecurity (10/01/2024)   Epic    Worried About Programme Researcher, Broadcasting/film/video in the Last Year: Never true    Ran Out of Food in the Last Year: Never true  Transportation Needs: No Transportation Needs (10/01/2024)   Epic    Lack of Transportation (Medical): No    Lack of Transportation (Non-Medical): No  Physical Activity: Insufficiently Active (12/20/2022)   Exercise Vital Sign    Days of Exercise per Week: 5 days    Minutes of Exercise per Session: 20 min  Stress: No Stress Concern Present (12/20/2022)   [employer] - Occupational Stress Questionnaire    Feeling of Stress : Not at all  Social Connections: Moderately Integrated (12/04/2023)   Social Connection and Isolation Panel    Frequency of Communication with Friends and Family: More than three times a week    Frequency of Social Gatherings with Friends and Family: More than three times a week    Attends Religious Services: More than 4 times per year    Active Member of [employer] or Organizations: Yes    Attends Banker Meetings: More than 4 times per year    Marital Status: Widowed  Intimate Partner Violence: Patient Unable To Answer (12/08/2023)   Humiliation, Afraid, Rape, and Kick questionnaire    Fear of Current or Ex-Partner: Patient unable to answer    Emotionally Abused: Patient unable to answer    Physically Abused: Patient unable to answer  Sexually Abused: Patient unable to answer  Depression (PHQ2-9): Low Risk (08/13/2024)   Depression (PHQ2-9)    PHQ-2 Score: 0  Alcohol Screen: Low Risk (12/20/2022)   Alcohol Screen    Last Alcohol Screening Score (AUDIT): 0  Housing: Unknown (10/01/2024)   Epic    Unable to Pay for Housing in the Last Year: No    Number of Times Moved in the Last Year: Not on file    Homeless in the Last Year: No  Utilities: Not At Risk (10/01/2024)   Epic    Threatened with loss of utilities: No  Health Literacy: Not on file    Past Surgical History:  Procedure Laterality Date   AMPUTATION Left 03/21/2022   Procedure: LEFT RING FINGER AND LEFT SMALL FINGER REVISION AMPUTATION;  Surgeon: Murrell Drivers, MD;  Location: [facility] OR;  Service: Orthopedics;  Laterality: Left;   event monitor     2012   HERNIA REPAIR Bilateral    1991, 1992   HIATAL HERNIA REPAIR     1997   IVC FILTER INSERTION     PROSTATE SURGERY      Family History  Problem Relation Age of Onset   Stroke Mother    Kidney disease Father    Heart disease Brother    Arthritis Brother    Heart disease  Brother    Prostate cancer Brother     Allergies[1]  Medications Ordered Prior to Encounter[2]  BP 120/68   Pulse (!) 113   Temp 97.7 F (36.5 C) (Oral)   Ht 5' 7 (1.702 m)   Wt 175 lb (79.4 kg)   SpO2 94%   BMI 27.41 kg/m       Objective:   Physical Exam Vitals and nursing note reviewed.  Constitutional:      General: He is not in acute distress.    Appearance: Normal appearance. He is not ill-appearing.  HENT:     Head: Normocephalic and atraumatic.     Right Ear: Tympanic membrane, ear canal and external ear normal. There is no impacted cerumen.     Left Ear: Tympanic membrane, ear canal and external ear normal. There is no impacted cerumen.     Nose: Nose normal. No congestion or rhinorrhea.     Mouth/Throat:     Mouth: Mucous membranes are moist.     Pharynx: Oropharynx is clear.  Eyes:     Extraocular Movements: Extraocular movements intact.     Conjunctiva/sclera: Conjunctivae normal.     Pupils: Pupils are equal, round, and reactive to light.  Neck:     Vascular: No carotid bruit.  Cardiovascular:     Rate and Rhythm: Normal rate and regular rhythm.     Pulses: Normal pulses.     Heart sounds: No murmur heard.    No friction rub. No gallop.  Pulmonary:     Effort: Pulmonary effort is normal.     Breath sounds: Normal breath sounds.  Abdominal:     General: Abdomen is flat. Bowel sounds are normal. There is no distension.     Palpations: Abdomen is soft. There is no hepatomegaly, mass or pulsatile mass.     Tenderness: There is abdominal tenderness in the epigastric area. There is no guarding or rebound.     Hernia: No hernia is present.  Musculoskeletal:        General: Normal range of motion.     Cervical back: Normal range of motion and neck supple.  Lymphadenopathy:  Cervical: No cervical adenopathy.  Skin:    General: Skin is warm and dry.     Capillary Refill: Capillary refill takes less than 2 seconds.  Neurological:     General: No  focal deficit present.     Mental Status: He is alert and oriented to person, place, and time.     Gait: Gait abnormal (slow gait with cane).  Psychiatric:        Mood and Affect: Mood normal.        Behavior: Behavior normal.        Thought Content: Thought content normal.        Judgment: Judgment normal.        Assessment & Plan:  1. Routine general medical examination at a health care facility (Primary) Today patient counseled on age appropriate routine health concerns for screening and prevention, each reviewed and up to date or declined. Immunizations reviewed and up to date or declined. Labs ordered and reviewed. Risk factors for depression reviewed and negative. Hearing function and visual acuity are intact. ADLs screened and addressed as needed. Functional ability and level of safety reviewed and appropriate. Education, counseling and referrals performed based on assessed risks today. Patient provided with a copy of personalized plan for preventive services.   2. Diabetes mellitus treated with oral medication (HCC) - Can consider going back to Lantus  as he had well controlled blood sugar while on this  - Lipid panel; Future - TSH; Future - CBC; Future - Comprehensive metabolic panel with GFR; Future - Hemoglobin A1c; Future - Hemoglobin A1c - Comprehensive metabolic panel with GFR - CBC - TSH - Lipid panel  3. Essential hypertension - Well controlled. No change in medication  - Lipid panel; Future - TSH; Future - CBC; Future - Comprehensive metabolic panel with GFR; Future - Comprehensive metabolic panel with GFR - CBC - TSH - Lipid panel  4. History of pulmonary embolism - Continue Eliquis  5 mg BID  - Lipid panel; Future - TSH; Future - CBC; Future - Comprehensive metabolic panel with GFR; Future - Comprehensive metabolic panel with GFR - CBC - TSH - Lipid panel  5. Hyperlipidemia associated with type 2 diabetes mellitus (HCC) - Consider increase in  statin  - Lipid panel; Future - TSH; Future - CBC; Future - Comprehensive metabolic panel with GFR; Future - Microalbumin/Creatinine Ratio, Urine; Future - Microalbumin/Creatinine Ratio, Urine - Comprehensive metabolic panel with GFR - CBC - TSH - Lipid panel  6. Gastroesophageal reflux disease without esophagitis - Will have him increase Prilosec to 40 mg daily  - Follow up if not helping  - Lipid panel; Future - TSH; Future - CBC; Future - Comprehensive metabolic panel with GFR; Future - Comprehensive metabolic panel with GFR - CBC - TSH - Lipid panel  7. Personal history of prostate cancer  - PSA; Future - PSA  8. Liver cirrhosis secondary to NASH (nonalcoholic steatohepatitis) (HCC) - Will order STAT MRI of abdomen and likely need referral back to GI  - Lipid panel; Future - TSH; Future - CBC; Future - Comprehensive metabolic panel with GFR; Future - MR Abdomen W Wo Contrast; Future - Comprehensive metabolic panel with GFR - CBC - TSH - Lipid panel  9. Primary osteoarthritis involving multiple joints - Can continue with Cymbalta    Darleene Shape, NP       [1]  Allergies Allergen Reactions   Lidocaine     ANTI ITCH CREAM, NO SPECIFICATIONS IN RECORDS.   Lisinopril Cough  Metformin  Diarrhea  [2]  Current Outpatient Medications on File Prior to Visit  Medication Sig Dispense Refill   omeprazole  (PRILOSEC) 20 MG capsule Take 1 capsule (20 mg total) by mouth daily. (Patient taking differently: Take 40 mg by mouth daily.) 90 capsule 0   ACCU-CHEK GUIDE TEST test strip use 4 TIMES DAILY 300 strip 3   Accu-Chek Softclix Lancets lancets Use as instructed 100 each 12   acetaminophen  (TYLENOL ) 325 MG tablet Take 2 tablets (650 mg total) by mouth every 6 (six) hours as needed for mild pain (pain score 1-3) (or Fever >/= 101).     albuterol  (VENTOLIN  HFA) 108 (90 Base) MCG/ACT inhaler Inhale 2 puffs into the lungs every 6 (six) hours as needed for wheezing or  shortness of breath. 8 g 0   amLODipine  (NORVASC ) 10 MG tablet TAKE ONE TABLET BY MOUTH DAILY 90 tablet 1   apixaban  (ELIQUIS ) 5 MG TABS tablet Take 5 mg by mouth 2 (two) times daily.     cholecalciferol (VITAMIN D3) 25 MCG (1000 UNIT) tablet Take 2,000 Units by mouth daily.     DULoxetine  (CYMBALTA ) 60 MG capsule TAKE ONE CAPSULE BY MOUTH DAILY 90 capsule 1   empagliflozin (JARDIANCE) 25 MG TABS tablet Take by mouth daily. Taking 1/2 tab daily     fluticasone  (FLONASE ) 50 MCG/ACT nasal spray Place 1 spray into both nostrils daily as needed for allergies.     Multiple Vitamins-Minerals (ICAPS AREDS 2 PO) Take by mouth.     polyethylene glycol powder (GLYCOLAX/MIRALAX) 17 GM/SCOOP powder Take 1 Container by mouth once.     potassium chloride  (KLOR-CON ) 10 MEQ tablet Take 10 mEq by mouth daily.     torsemide  (DEMADEX ) 5 MG tablet TAKE ONE TABLET BY MOUTH DAILY 90 tablet 1   valsartan  (DIOVAN ) 160 MG tablet Take 1 tablet (160 mg total) by mouth daily. 90 tablet 3   No current facility-administered medications on file prior to visit.   "

## 2024-10-27 ENCOUNTER — Ambulatory Visit (HOSPITAL_COMMUNITY)
Admission: RE | Admit: 2024-10-27 | Discharge: 2024-10-27 | Disposition: A | Discharge status: Home / Self Care | Source: Ambulatory Visit | Attending: Adult Health | Admitting: Adult Health

## 2024-10-27 DIAGNOSIS — K7581 Nonalcoholic steatohepatitis (NASH): Secondary | ICD-10-CM | POA: Diagnosis present

## 2024-10-27 DIAGNOSIS — K7469 Other cirrhosis of liver: Secondary | ICD-10-CM | POA: Diagnosis present

## 2024-10-27 LAB — COMPREHENSIVE METABOLIC PANEL WITH GFR
ALT: 30 U/L (ref 3–53)
AST: 28 U/L (ref 5–37)
Albumin: 3.9 g/dL (ref 3.5–5.2)
Alkaline Phosphatase: 95 U/L (ref 39–117)
BUN: 25 mg/dL — ABNORMAL HIGH (ref 6–23)
CO2: 35 meq/L — ABNORMAL HIGH (ref 19–32)
Calcium: 10.6 mg/dL — ABNORMAL HIGH (ref 8.4–10.5)
Chloride: 93 meq/L — ABNORMAL LOW (ref 96–112)
Creatinine, Ser: 1.18 mg/dL (ref 0.40–1.50)
GFR: 54.57 mL/min — ABNORMAL LOW
Glucose, Bld: 231 mg/dL — ABNORMAL HIGH (ref 70–99)
Potassium: 4.5 meq/L (ref 3.5–5.1)
Sodium: 138 meq/L (ref 135–145)
Total Bilirubin: 0.5 mg/dL (ref 0.2–1.2)
Total Protein: 7.3 g/dL (ref 6.0–8.3)

## 2024-10-27 LAB — LIPID PANEL
Cholesterol: 193 mg/dL (ref 28–200)
HDL: 59.1 mg/dL
LDL Cholesterol: 57 mg/dL (ref 10–99)
NonHDL: 134.02
Total CHOL/HDL Ratio: 3
Triglycerides: 383 mg/dL — ABNORMAL HIGH (ref 10.0–149.0)
VLDL: 76.6 mg/dL — ABNORMAL HIGH (ref 0.0–40.0)

## 2024-10-27 LAB — MICROALBUMIN / CREATININE URINE RATIO
Creatinine,U: 39.4 mg/dL
Microalb Creat Ratio: 27.4 mg/g (ref 0.0–30.0)
Microalb, Ur: 1.1 mg/dL (ref 0.7–1.9)

## 2024-10-27 LAB — CBC
HCT: 42.8 % (ref 39.0–52.0)
Hemoglobin: 14.7 g/dL (ref 13.0–17.0)
MCHC: 34.4 g/dL (ref 30.0–36.0)
MCV: 87 fl (ref 78.0–100.0)
Platelets: 295 K/uL (ref 150.0–400.0)
RBC: 4.92 Mil/uL (ref 4.22–5.81)
RDW: 13.7 % (ref 11.5–15.5)
WBC: 7.1 K/uL (ref 4.0–10.5)

## 2024-10-27 LAB — TSH: TSH: 0.95 u[IU]/mL (ref 0.35–5.50)

## 2024-10-27 LAB — PSA: PSA: 0.02 ng/mL — ABNORMAL LOW (ref 0.10–4.00)

## 2024-10-27 LAB — HEMOGLOBIN A1C: Hgb A1c MFr Bld: 9 % — ABNORMAL HIGH (ref 4.6–6.5)

## 2024-10-27 MED ORDER — GADOBUTROL 1 MMOL/ML IV SOLN
8.0000 mL | Freq: Once | INTRAVENOUS | Status: AC | PRN
  Administered 2024-10-27: 8 mL via INTRAVENOUS

## 2024-10-28 ENCOUNTER — Other Ambulatory Visit (HOSPITAL_COMMUNITY): Payer: Self-pay | Admitting: *Deleted

## 2024-10-28 ENCOUNTER — Ambulatory Visit: Payer: Self-pay | Admitting: Adult Health

## 2024-10-28 ENCOUNTER — Other Ambulatory Visit: Payer: Self-pay | Admitting: Adult Health

## 2024-10-28 DIAGNOSIS — E1165 Type 2 diabetes mellitus with hyperglycemia: Secondary | ICD-10-CM

## 2024-10-28 DIAGNOSIS — T17908A Unspecified foreign body in respiratory tract, part unspecified causing other injury, initial encounter: Secondary | ICD-10-CM

## 2024-10-28 DIAGNOSIS — E781 Pure hyperglyceridemia: Secondary | ICD-10-CM

## 2024-10-28 DIAGNOSIS — R1310 Dysphagia, unspecified: Secondary | ICD-10-CM

## 2024-10-28 DIAGNOSIS — K7469 Other cirrhosis of liver: Secondary | ICD-10-CM

## 2024-10-28 MED ORDER — INSULIN PEN NEEDLE 29G X 12MM MISC: 3 refills | Status: AC

## 2024-10-28 MED ORDER — FENOFIBRATE 145 MG PO TABS: 145.0000 mg | ORAL_TABLET | Freq: Every day | ORAL | 3 refills | Status: AC

## 2024-10-28 MED ORDER — LANTUS SOLOSTAR 100 UNIT/ML ~~LOC~~ SOPN: 5.0000 [IU] | PEN_INJECTOR | Freq: Every day | SUBCUTANEOUS | 1 refills | Status: AC

## 2024-11-05 ENCOUNTER — Ambulatory Visit (HOSPITAL_COMMUNITY)
Admission: RE | Admit: 2024-11-05 | Discharge: 2024-11-05 | Disposition: A | Source: Ambulatory Visit | Attending: Adult Health

## 2024-11-05 DIAGNOSIS — R1310 Dysphagia, unspecified: Secondary | ICD-10-CM | POA: Diagnosis present

## 2024-11-05 DIAGNOSIS — T17908A Unspecified foreign body in respiratory tract, part unspecified causing other injury, initial encounter: Secondary | ICD-10-CM

## 2024-11-05 DIAGNOSIS — R1313 Dysphagia, pharyngeal phase: Secondary | ICD-10-CM | POA: Diagnosis not present

## 2024-11-05 NOTE — Evaluation (Signed)
 Modified Barium Swallow Study  Patient Details  Name: Dakota Lewis MRN: [id number] Date of Birth: 1933/05/25  Today's Date: 11/05/2024  Modified Barium Swallow completed.  Full report located under Chart Review in the Imaging Section.  History of Present Illness Dakota Lewis is an 89 y.o. male who was referred for OP MBS due to chest imaging showing repeated concerns for potential aspiration. His daughter, Dakota Lewis, was present for the exam. Neither report any hx of swallowing problems. They acknowledged recent weight loss of 15 lbs last 6-8 weeks and report that since his dx of pna he has had trouble bouncing back, with reduced mobility, increased napping in recliner.   Clinical Impression Dakota Lewis presents with a mild-mod pharyngeal dysphagia that is related to timing of the pharyngeal swallow. Thin and nectar thick liquids spilled into the larynx prior to onset of the swallow.  Once onset occurred, there was sufficient movement of the structures and laryngeal closure was achieved, but trace amounts of barium reached the vocal folds and just below.  (Postural adjustments did not enhance timing of closure.) There was no gross aspiration.  Esophageal sweep revealed retention in the esophagus.  Oral phase was WNL.    Spoke with pt and dtr, Dakota Lewis, after the study.  Despite how mild the deficit is, given his recent pna, deconditioning, and difficulty recovering back to baseline, he may benefit from a trial of OP SLP to address timing of airway closure.  During the study, he practiced focusing on the swallow while being more attentive to it, watching the imaging for feedback, and was able to demonstrate improved timing. Pt/dtr agree to trial of OP therapy. Pt may also benefit from OP PT re-eval to improve mobility/balance at home.  Continue current diet - no modifications necessary.  DIGEST Swallow Severity Rating*  Safety: 2  Efficiency: 0  Overall Pharyngeal Swallow Severity:  2 1: mild; 2:  moderate; 3: severe; 4: profound  *The Dynamic Imaging Grade of Swallowing Toxicity is standardized for the head and neck cancer population, however, demonstrates promising clinical applications across populations to standardize the clinical rating of pharyngeal swallow safety and severity.  Factors that may increase risk of adverse event in presence of aspiration Dakota Lewis 2021): Limited mobility;Frail or deconditioned  Swallow Evaluation Recommendations Recommendations: PO diet PO Diet Recommendation: Regular;Thin liquids (Level 0) Medication Administration: Whole meds with liquid Supervision: Patient able to self-feed Postural changes: Position pt fully upright for meals;Stay upright 30-60 min after meals Oral care recommendations: Oral care BID (2x/day)     [name], MA CCC/SLP Clinical Specialist - Acute Care SLP Acute Rehabilitation Services Office number [phone]  [name] 11/05/2024,4:57 PM

## 2024-11-10 ENCOUNTER — Other Ambulatory Visit: Payer: Self-pay | Admitting: Adult Health

## 2024-11-10 ENCOUNTER — Ambulatory Visit: Payer: Self-pay

## 2024-11-10 NOTE — Telephone Encounter (Signed)
 FYI Only or Action Required?: FYI only for provider: appointment scheduled on 11/11/24.  Patient was last seen in primary care on 10/26/2024 by Merna Huxley, NP.  Called Nurse Triage reporting Cough.  Symptoms began several months ago.  Interventions attempted: Other: unknown.  Symptoms are: gradually worsening.  Triage Disposition: See Physician Within 24 Hours (overriding See HCP Within 4 Hours (Or PCP Triage))  Patient/caregiver understands and will follow disposition?: Yes                Message from [facility] sent at 11/10/2024  3:47 PM EST  Reason for Triage: Patient's daughter Inocente stated patient has developed another deep and rough cough since Monday stated it's been getting worse, hurts him down in his chest   Reason for Disposition  Wheezing is present  Answer Assessment - Initial Assessment Questions Daughter, Inocente, calling in for triage. Her daughter told her to make the patient an appointment for worsening cough and did not supply any additional information. Inocente is not with the patient, attempted calling and busy signal received. Triage is limited due to Inocente is not with patient and unable to get a hold of him.   Last couple months sick with a deep cough.  Recently worsening today.  This weekend cough was productive with some wheezing noticed. Fatigue and increased sleep. Unsure if patient is using his albuterol  inhaler.   No chest pain, fever, hemoptysis. Unable to answer breathing questions.   Pneumonia last month or 2 Flu in January  Advised if able to get a hold of patient and he complains of fever, chest pain, difficulty breathing then to take him to ED.  Protocols used: Cough - Acute Productive-A-AH

## 2024-11-11 ENCOUNTER — Ambulatory Visit

## 2024-11-11 ENCOUNTER — Ambulatory Visit: Admitting: Adult Health

## 2024-11-11 ENCOUNTER — Encounter: Payer: Self-pay | Admitting: Adult Health

## 2024-11-11 ENCOUNTER — Ambulatory Visit: Payer: Self-pay

## 2024-11-11 VITALS — BP 120/78 | HR 98 | Temp 97.5°F | Ht 67.0 in | Wt 169.0 lb

## 2024-11-11 DIAGNOSIS — R053 Chronic cough: Secondary | ICD-10-CM

## 2024-11-11 DIAGNOSIS — E1165 Type 2 diabetes mellitus with hyperglycemia: Secondary | ICD-10-CM

## 2024-11-11 MED ORDER — BENZONATATE 200 MG PO CAPS: 200.0000 mg | ORAL_CAPSULE | Freq: Three times a day (TID) | ORAL | 2 refills | Status: AC | PRN

## 2024-11-11 MED ORDER — PREDNISONE 10 MG PO TABS: 10.0000 mg | ORAL_TABLET | Freq: Every day | ORAL | 0 refills | Status: AC

## 2024-11-11 NOTE — Telephone Encounter (Signed)
 Daughter calling to reschedule for earlier appt. Changed to 1:30

## 2024-11-11 NOTE — Progress Notes (Signed)
 "  Subjective:    Patient ID: Dakota Lewis, male    DOB: November 22, 1932, 89 y.o.   MRN: [id number]  HPI Discussed the use of AI scribe software for clinical note transcription with the patient, who gave verbal consent to proceed.  History of Present Illness   Dakota Lewis is an 89 year old male who presents with persistent cough and dizziness.  He has had a persistent cough with dizziness and pressure above his eyes. The dizziness feels like loss of balance rather than spinning and occurs mainly with coughing fits. He feels unsteady when rising from a chair, especially if his walker is not nearby, and may stagger across the room, but he has had no falls. He feels better when sitting and not coughing.    Additionally, he had a recent swallow study as an MRI showed concern for aspiration vs pneumonia; at the time there was concern for secondary pneumonia after influenza and he was treated with Doxycycline .   A swallow study showed some possible aspiration, but he refuses speech therapy.     Overall he reports feeling better than when he had flu and possible pneumonia but wants to get rid of the cough.      He reports that his blood sugars have been coming down since going back on insulin  a couple of weeks ago. He is seeing readings in the 150-180 range which is an improvement from the 200's.   Review of Systems See HPI   Past Medical History:  Diagnosis Date   Bilateral pulmonary embolism (HCC)    lovenox & coumadin thearpy   Chronic cough    DM (diabetes mellitus) (HCC)    DVT (deep venous thrombosis) (HCC)    left lower   Gastrointestinal bleed    prior   H/O cardiovascular stress test 05/07/2011   normal study, low risk scan   H/O Doppler ultrasound 2013   venous duplex doppler   H/O Doppler ultrasound 2012   venous duplex doppler   H/O echocardiogram 03/14/2011   EF 65-70%   H/O exercise stress test 2006   neg bruce protocol excercise stress test   Hiatal hernia     Hypertension    Prostate cancer [facility])    s/p radiation    Social History   Socioeconomic History   Marital status: Married    Spouse name: Not on file   Number of children: 3   Years of education: Not on file   Highest education level: Not on file  Occupational History   Not on file  Tobacco Use   Smoking status: Former    Current packs/day: 0.75    Average packs/day: 0.8 packs/day for 20.0 years (15.0 ttl pk-yrs)    Types: Cigarettes    Passive exposure: Past   Smokeless tobacco: Never  Vaping Use   Vaping status: Never Used  Substance and Sexual Activity   Alcohol use: No   Drug use: No   Sexual activity: Not on file  Other Topics Concern   Not on file  Social History Narrative   Not on file   Social Drivers of Health   Tobacco Use: Medium Risk (11/11/2024)   Patient History    Smoking Tobacco Use: Former    Smokeless Tobacco Use: Never    Passive Exposure: Past  Physicist, Medical Strain: Low Risk (12/20/2022)   Overall Financial Resource Strain (CARDIA)    Difficulty of Paying Living Expenses: Not hard at all  Food Insecurity: No Food Insecurity (10/01/2024)  Epic    Worried About Programme Researcher, Broadcasting/film/video in the Last Year: Never true    [employer] of Food in the Last Year: Never true  Transportation Needs: No Transportation Needs (10/01/2024)   Epic    Lack of Transportation (Medical): No    Lack of Transportation (Non-Medical): No  Physical Activity: Insufficiently Active (12/20/2022)   Exercise Vital Sign    Days of Exercise per Week: 5 days    Minutes of Exercise per Session: 20 min  Stress: No Stress Concern Present (12/20/2022)   [employer] of Occupational Health - Occupational Stress Questionnaire    Feeling of Stress : Not at all  Social Connections: Moderately Integrated (12/04/2023)   Social Connection and Isolation Panel    Frequency of Communication with Friends and Family: More than three times a week    Frequency of Social Gatherings with  Friends and Family: More than three times a week    Attends Religious Services: More than 4 times per year    Active Member of [employer] or Organizations: Yes    Attends Banker Meetings: More than 4 times per year    Marital Status: Widowed  Intimate Partner Violence: Patient Unable To Answer (12/08/2023)   Humiliation, Afraid, Rape, and Kick questionnaire    Fear of Current or Ex-Partner: Patient unable to answer    Emotionally Abused: Patient unable to answer    Physically Abused: Patient unable to answer    Sexually Abused: Patient unable to answer  Depression (PHQ2-9): Low Risk (08/13/2024)   Depression (PHQ2-9)    PHQ-2 Score: 0  Alcohol Screen: Low Risk (12/20/2022)   Alcohol Screen    Last Alcohol Screening Score (AUDIT): 0  Housing: Unknown (10/01/2024)   Epic    Unable to Pay for Housing in the Last Year: No    Number of Times Moved in the Last Year: Not on file    Homeless in the Last Year: No  Utilities: Not At Risk (10/01/2024)   Epic    Threatened with loss of utilities: No  Health Literacy: Not on file    Past Surgical History:  Procedure Laterality Date   AMPUTATION Left 03/21/2022   Procedure: LEFT RING FINGER AND LEFT SMALL FINGER REVISION AMPUTATION;  Surgeon: Murrell Drivers, MD;  Location: [facility] OR;  Service: Orthopedics;  Laterality: Left;   event monitor     2012   HERNIA REPAIR Bilateral    1991, 1992   HIATAL HERNIA REPAIR     1997   IVC FILTER INSERTION     PROSTATE SURGERY      Family History  Problem Relation Age of Onset   Stroke Mother    Kidney disease Father    Heart disease Brother    Arthritis Brother    Heart disease Brother    Prostate cancer Brother     Allergies[1]  Medications Ordered Prior to Encounter[2]  BP 120/78   Pulse 98   Temp (!) 97.5 F (36.4 C) (Oral)   Ht 5' 7 (1.702 m)   Wt 169 lb (76.7 kg)   SpO2 94%   BMI 26.47 kg/m       Objective:   Physical Exam Vitals and nursing note reviewed.   Constitutional:      Appearance: Normal appearance.  Cardiovascular:     Rate and Rhythm: Normal rate and regular rhythm.     Pulses: Normal pulses.     Heart sounds: Normal heart sounds.  Pulmonary:  Effort: Pulmonary effort is normal.     Breath sounds: Normal breath sounds. No wheezing, rhonchi or rales.  Musculoskeletal:        General: Normal range of motion.  Skin:    General: Skin is warm and dry.  Neurological:     General: No focal deficit present.     Mental Status: He is alert and oriented to person, place, and time.     Motor: Weakness present.     Gait: Gait abnormal (walks with cane).  Psychiatric:        Mood and Affect: Mood normal.        Behavior: Behavior normal.        Thought Content: Thought content normal.        Judgment: Judgment normal.       Assessment & Plan:  Assessment and Plan    Chronic cough - Lungs clear throughout.  - Ordered chest x-ray to evaluate for pneumonia. - Prescribed oral prednisone  for 5 days. - Increased insulin  to 12 units while on prednisone , adjust based on blood sugar. - Prescribed Tessalon  up to three times a day. - Advised regular inhaler use.  Type 2 diabetes mellitus . Prednisone  may elevate levels. - Increase Lantus  to 12 units daily and titrate as needed - Monitor blood sugar closely, especially during prednisone  treatment.       Marquette Blodgett, NP  I personally spent a total of 32 minutes in the care of the patient today including preparing to see the patient, getting/reviewing separately obtained history, performing a medically appropriate exam/evaluation, counseling and educating, placing orders, and documenting clinical information in the EHR.      [1]  Allergies Allergen Reactions   Lidocaine     ANTI ITCH CREAM, NO SPECIFICATIONS IN RECORDS.   Lisinopril Cough   Metformin  Diarrhea  [2]  Current Outpatient Medications on File Prior to Visit  Medication Sig Dispense Refill   ACCU-CHEK GUIDE  TEST test strip use 4 TIMES DAILY 300 strip 3   Accu-Chek Softclix Lancets lancets Use as instructed 100 each 12   acetaminophen  (TYLENOL ) 325 MG tablet Take 2 tablets (650 mg total) by mouth every 6 (six) hours as needed for mild pain (pain score 1-3) (or Fever >/= 101).     albuterol  (VENTOLIN  HFA) 108 (90 Base) MCG/ACT inhaler Inhale 2 puffs into the lungs every 6 (six) hours as needed for wheezing or shortness of breath. 8 g 0   amLODipine  (NORVASC ) 10 MG tablet TAKE ONE TABLET BY MOUTH DAILY 90 tablet 1   apixaban  (ELIQUIS ) 5 MG TABS tablet Take 5 mg by mouth 2 (two) times daily.     cholecalciferol (VITAMIN D3) 25 MCG (1000 UNIT) tablet Take 2,000 Units by mouth daily.     DULoxetine  (CYMBALTA ) 60 MG capsule TAKE ONE CAPSULE BY MOUTH DAILY 90 capsule 1   empagliflozin (JARDIANCE) 25 MG TABS tablet Take by mouth daily. Taking 1/2 tab daily     fenofibrate  (TRICOR ) 145 MG tablet Take 1 tablet (145 mg total) by mouth daily. 90 tablet 3   fluticasone  (FLONASE ) 50 MCG/ACT nasal spray Place 1 spray into both nostrils daily as needed for allergies.     insulin  glargine (LANTUS  SOLOSTAR) 100 UNIT/ML Solostar Pen Inject 5 Units into the skin daily. 15 mL 1   Insulin  Pen Needle 29G X MISC Use to give insulin  daily 90 each 3   Multiple Vitamins-Minerals (ICAPS AREDS 2 PO) Take by mouth.  omeprazole  (PRILOSEC) 20 MG capsule Take 1 capsule (20 mg total) by mouth daily. (Patient taking differently: Take 40 mg by mouth daily.) 90 capsule 0   polyethylene glycol powder (GLYCOLAX/MIRALAX) 17 GM/SCOOP powder Take 1 Container by mouth once.     potassium chloride  (KLOR-CON ) 10 MEQ tablet Take 10 mEq by mouth daily.     torsemide  (DEMADEX ) 5 MG tablet TAKE ONE TABLET BY MOUTH DAILY 90 tablet 1   valsartan  (DIOVAN ) 160 MG tablet Take 1 tablet (160 mg total) by mouth daily. 90 tablet 3   No current facility-administered medications on file prior to visit.   "

## 2024-11-11 NOTE — Telephone Encounter (Signed)
 error

## 2024-11-23 ENCOUNTER — Encounter (HOSPITAL_COMMUNITY)
